# Patient Record
Sex: Female | Born: 1945
Health system: Southern US, Community
[De-identification: ages and names within clinical notes are randomized; demographics above are authoritative.]

## PROBLEM LIST (undated history)

## (undated) DIAGNOSIS — K219 Gastro-esophageal reflux disease without esophagitis: Secondary | ICD-10-CM

## (undated) DIAGNOSIS — Z950 Presence of cardiac pacemaker: Secondary | ICD-10-CM

## (undated) DIAGNOSIS — F419 Anxiety disorder, unspecified: Secondary | ICD-10-CM

## (undated) DIAGNOSIS — R9439 Abnormal result of other cardiovascular function study: Secondary | ICD-10-CM

## (undated) DIAGNOSIS — I10 Essential (primary) hypertension: Secondary | ICD-10-CM

## (undated) DIAGNOSIS — I499 Cardiac arrhythmia, unspecified: Secondary | ICD-10-CM

## (undated) DIAGNOSIS — Z8719 Personal history of other diseases of the digestive system: Secondary | ICD-10-CM

## (undated) DIAGNOSIS — L910 Hypertrophic scar: Secondary | ICD-10-CM

## (undated) DIAGNOSIS — E0500 Thyrotoxicosis with diffuse goiter without thyrotoxic crisis or storm: Secondary | ICD-10-CM

## (undated) DIAGNOSIS — M5430 Sciatica, unspecified side: Secondary | ICD-10-CM

## (undated) DIAGNOSIS — M1990 Unspecified osteoarthritis, unspecified site: Secondary | ICD-10-CM

## (undated) DIAGNOSIS — R55 Syncope and collapse: Secondary | ICD-10-CM

## (undated) DIAGNOSIS — F32A Depression, unspecified: Secondary | ICD-10-CM

## (undated) DIAGNOSIS — M5030 Other cervical disc degeneration, unspecified cervical region: Secondary | ICD-10-CM

## (undated) DIAGNOSIS — R7309 Other abnormal glucose: Secondary | ICD-10-CM

## (undated) DIAGNOSIS — E890 Postprocedural hypothyroidism: Secondary | ICD-10-CM

## (undated) DIAGNOSIS — R7303 Prediabetes: Secondary | ICD-10-CM

## (undated) DIAGNOSIS — D649 Anemia, unspecified: Secondary | ICD-10-CM

## (undated) DIAGNOSIS — E559 Vitamin D deficiency, unspecified: Secondary | ICD-10-CM

## (undated) HISTORY — DX: Hypertrophic scar: L91.0

## (undated) HISTORY — DX: Other abnormal glucose: R73.09

## (undated) HISTORY — PX: APPENDECTOMY: SHX54

## (undated) HISTORY — DX: Anemia, unspecified: D64.9

## (undated) HISTORY — DX: Sciatica, unspecified side: M54.30

## (undated) HISTORY — DX: Thyrotoxicosis with diffuse goiter without thyrotoxic crisis or storm: E05.00

## (undated) HISTORY — DX: Presence of cardiac pacemaker: Z95.0

## (undated) HISTORY — PX: BUNIONECTOMY: SHX129

## (undated) HISTORY — DX: Gastro-esophageal reflux disease without esophagitis: K21.9

## (undated) HISTORY — PX: FRACTURE SURGERY: SHX138

## (undated) HISTORY — DX: Essential (primary) hypertension: I10

## (undated) HISTORY — DX: Abnormal result of other cardiovascular function study: R94.39

## (undated) HISTORY — PX: PACEMAKER INSERTION: SHX728

## (undated) HISTORY — PX: OTHER SURGICAL HISTORY: SHX169

## (undated) HISTORY — DX: Postprocedural hypothyroidism: E89.0

## (undated) HISTORY — DX: Syncope and collapse: R55

## (undated) HISTORY — DX: Vitamin D deficiency, unspecified: E55.9

---

## 1999-04-08 HISTORY — PX: BREAST BIOPSY: SHX20

## 2005-11-08 ENCOUNTER — Inpatient Hospital Stay (HOSPITAL_COMMUNITY): Admission: RE | Admit: 2005-11-08 | Discharge: 2005-11-09 | Payer: Self-pay | Admitting: Internal Medicine

## 2005-11-08 ENCOUNTER — Encounter (INDEPENDENT_AMBULATORY_CARE_PROVIDER_SITE_OTHER): Payer: Self-pay | Admitting: Specialist

## 2006-03-12 ENCOUNTER — Encounter: Admission: RE | Admit: 2006-03-12 | Discharge: 2006-03-12 | Payer: Self-pay | Admitting: Endocrinology

## 2008-08-18 ENCOUNTER — Emergency Department (HOSPITAL_COMMUNITY): Admission: EM | Admit: 2008-08-18 | Discharge: 2008-08-18 | Payer: Self-pay | Admitting: Emergency Medicine

## 2008-08-25 ENCOUNTER — Encounter: Admission: RE | Admit: 2008-08-25 | Discharge: 2008-08-25 | Payer: Self-pay | Admitting: Gastroenterology

## 2009-11-14 LAB — HM COLONOSCOPY

## 2010-03-22 ENCOUNTER — Encounter: Payer: Self-pay | Admitting: Internal Medicine

## 2010-03-26 ENCOUNTER — Encounter: Payer: Self-pay | Admitting: Internal Medicine

## 2010-03-27 ENCOUNTER — Encounter: Payer: Self-pay | Admitting: Internal Medicine

## 2010-03-28 ENCOUNTER — Encounter: Payer: Self-pay | Admitting: Cardiology

## 2010-03-28 ENCOUNTER — Ambulatory Visit: Payer: Self-pay

## 2010-03-28 ENCOUNTER — Ambulatory Visit (HOSPITAL_COMMUNITY)
Admission: RE | Admit: 2010-03-28 | Discharge: 2010-03-28 | Payer: Self-pay | Source: Home / Self Care | Attending: Internal Medicine | Admitting: Internal Medicine

## 2010-04-10 ENCOUNTER — Ambulatory Visit: Admission: RE | Admit: 2010-04-10 | Discharge: 2010-04-10 | Payer: Self-pay | Source: Home / Self Care

## 2010-04-15 ENCOUNTER — Ambulatory Visit
Admission: RE | Admit: 2010-04-15 | Discharge: 2010-04-15 | Payer: Self-pay | Source: Home / Self Care | Attending: Internal Medicine | Admitting: Internal Medicine

## 2010-04-15 DIAGNOSIS — I4949 Other premature depolarization: Secondary | ICD-10-CM | POA: Insufficient documentation

## 2010-04-15 DIAGNOSIS — I10 Essential (primary) hypertension: Secondary | ICD-10-CM | POA: Insufficient documentation

## 2010-04-15 DIAGNOSIS — E890 Postprocedural hypothyroidism: Secondary | ICD-10-CM | POA: Insufficient documentation

## 2010-04-15 DIAGNOSIS — I493 Ventricular premature depolarization: Secondary | ICD-10-CM | POA: Insufficient documentation

## 2010-04-15 DIAGNOSIS — K2990 Gastroduodenitis, unspecified, without bleeding: Secondary | ICD-10-CM

## 2010-04-15 DIAGNOSIS — K2970 Gastritis, unspecified, without bleeding: Secondary | ICD-10-CM | POA: Insufficient documentation

## 2010-04-15 DIAGNOSIS — K219 Gastro-esophageal reflux disease without esophagitis: Secondary | ICD-10-CM | POA: Insufficient documentation

## 2010-04-15 DIAGNOSIS — R928 Other abnormal and inconclusive findings on diagnostic imaging of breast: Secondary | ICD-10-CM | POA: Insufficient documentation

## 2010-04-28 ENCOUNTER — Encounter: Payer: Self-pay | Admitting: Endocrinology

## 2010-05-09 NOTE — Assessment & Plan Note (Signed)
Summary: nep/syncope-mb   Visit Type:  np Referring Provider:  Marinda Elk Primary Provider:  Dr. Daisy Floro  CC:  palpitations, syncope, and dizziness.  History of Present Illness: Cindy [name] is seen at the request of doctors Foy Guadalajara and Christella Scheuermann, with both of whom she is personal friends.   She is aremote history of vasovagal syncope, including following pain, phlebotomy, and IUD insertion.  She is referred now because of 2 episodes of syncope, the first of which occurred in May 2 of which occurred in November. Both episodes occurred while eating soft foods. The first occurred in a car. The second occurred sitting on the sofa.  She also had episode of presyncope in December which was associated with palpitations which he had noted for the first time the month before. She feels these as in pulses in her neck and when she takes her pulse she notes pulses. A Holter monitor was obtained by her primary physician demonstrated frequent PVCs which appear morphologically to be consistent with origin in the outflow tract and comprised 13% of her total beats. Thankfully, her echo cardiogram is obtained and demonstrates normal left ventricular function.     Problems Prior to Update: 1)  Syncope-deglutition  (ICD-780.2) 2)  Premature Ventricular Contractions  (ICD-427.69) 3)  Gastritis  (ICD-535.50) 4)  Unspecified Abnormal Mammogram  (ICD-793.80) 5)  Graves' Disease  (ICD-242.00) 6)  Gerd  (ICD-530.81) 7)  Hypertension  (ICD-401.9)  Current Medications (verified): 1)  Naprosyn 250 Mg Tabs (Naproxen) .Marland Kitchen.. 1 Tablet Two Times A Day, As Needed 2)  Claritin 10 Mg Tabs (Loratadine) .Marland Kitchen.. 1 Tablet Once Daily, As Needed 3)  Aspirin 81 Mg Tbec (Aspirin) .... Take One Tablet By Mouth Daily 4)  Daily-Vitamin  Tabs (Multiple Vitamin) .Marland Kitchen.. 1 Tablet Once Daily 5)  Omega-3 Epa Fish Oil 1205 Mg Caps (Omega-3 Fatty Acids) .... 1000mg  Once Daily 6)  Vitamin D 1000 Unit Tabs (Cholecalciferol) .... Once  Daily 7)  Prevacid 30 Mg Cpdr (Lansoprazole) .Marland Kitchen.. 1 Tablet Once Daily 8)  Dyazide 37.5-25 Mg Caps (Triamterene-Hctz) .Marland Kitchen.. 1 Cap Once Daily 9)  Paxil 10 Mg Tabs (Paroxetine Hcl) .Marland Kitchen.. 1 Tablet Once Daily 10)  Lisinopril 10 Mg Tabs (Lisinopril) .... Take One Tablet By Mouth Daily  Allergies (verified): 1)  ! Codeine  Past History:  Family History: Last updated: 05-11-10 Father: died age 64 (MI) Mother: died from COPD 53  Social History: Last updated: May 11, 2010 Full Time Married  Tobacco Use - Former.  Alcohol Use - yes  Risk Factors: Smoking Status: quit (May 11, 2010)  Past Medical History: gastroesophageal reflux Hypertension  Review of Systems       full review of systems was negative apart from a history of present illness and past medical history.   Vital Signs:  Patient profile:   65 year old female Height:      62 inches Weight:      156.25 pounds BMI:     28.68 Pulse rate:   63 / minute Pulse (ortho):   61 / minute BP sitting:   145 / 63  (left arm) BP standing:   150 / 80  Vitals Entered By: Caralee Ates CMA May 11, 2010 11:13 AM)  Serial Vital Signs/Assessments:  Time      Position  BP       Pulse  Resp  Temp     By           Lying RA  168/76   76  Claris Gladden RN           Sitting   159/77   60                    Claris Gladden RN           Standing  150/80   61                    Claris Gladden RN 2 min     Standing  142/76   59                    Claris Gladden RN 5 min     Standing  160/73   64                    Claris Gladden RN   Physical Exam  General:  Well developed, well nourished,note the older age Caucasian female appearing her stated age in no acute distress. Head:  normal HEENT Neck:  supple without thyromegaly the flat neck veins with normal carotids were brisk and full Chest Wall:  without kyphosis or scoliosis Lungs:  clear to auscultation Heart:  regular rate and rhythm without murmurs or  gallops Abdomen:  soft with active bowel sounds without benign pulsation or hepatomegaly Msk:  without obvious musculoskeletal defects Pulses:  intact distal pulses Extremities:  without clubbing cyanosis or edema Neurologic:  alert and oriented and grossly normal motor and sensory function Skin:  warm and dry Cervical Nodes:  without adenopathy Psych:  engaging    Holter Monitor  Procedure date:  04/15/2010  Findings:      sinus rhythm with rates ranging from 48-113 Frequent PVCs of a dominant morphology comprising 13% of total beats most consistent with outflow tract origin Short runs of atrial tachycardia  Impression & Recommendations:  Problem # 1:  SYNCOPE-DEGLUTITION (ICD-780.2) Is the patient's impression that her syncope is associated with swallowing.  She describes a seizing" sensation prior to this. Swallowing syncope is a reflex syncope associated with an effector limb of bradycardia and hypotension, the trigger of which can be variable. It is unusual in my limited experience with [place] at soft mushy foods would serve as the trigger. She also has this episode of presyncope associated with palpitations. Her Holter monitor demonstrated frequent (greater than 13%) PVCs likely of an outflow tract origin which were for her largely asymptomatic. This suggests the possibility that 1) the swallowing syncopal episodes were in fact related to the PVCs in the swallowing is a "red herring" and 2) the palpitations that she felt with her December episode represented more than just PVCs, i.e. perhaps ventricular tachycardia.  Given the distinct possibility for the cause of syncope, implantable loop according may be our only way of identifying the mechanism, anticipating that deglutition would be associated with bradycardia, and outflow tract tachycardia would be clearly visible. It is possible that deglutition is associated only with hypotension so it is not necessarily a perfect test.  An  alternative approach would be to an undertaken empiric therapy using beta blockers and calcium blockers and she needs augmented blood pressure therapy anyway. This might be somewhat limited by her bradycardia which is identified on her Holter monitor. I have reviewed these things with her extensively and will talk with her primary physicians about this. Her updated medication list for this problem includes:    Aspirin 81 Mg Tbec (Aspirin) .Marland Kitchen... Take one tablet by mouth daily  Lisinopril 10 Mg Tabs (Lisinopril) .Marland Kitchen... Take one tablet by mouth daily  Problem # 2:  PREMATURE VENTRICULAR CONTRACTIONS (ICD-427.69) she has frequent PVCs; they're largely asymptomatic. Thankfully they are unassociated with any evidence of a secondary cardiomyopathy. Her updated medication list for this problem includes:    Aspirin 81 Mg Tbec (Aspirin) .Marland Kitchen... Take one tablet by mouth daily    Lisinopril 10 Mg Tabs (Lisinopril) .Marland Kitchen... Take one tablet by mouth daily  Problem # 3:  HYPERTENSION (ICD-401.9) her blood pressure remains poorly controlled and she sterilely had reactive hypertension. We will treat her with a low-dose beta blocker in the hopes of trying to mitigate adrenaline surges as a reactive event as well as to have an impact on decreasing her PVC burden. Her updated medication list for this problem includes:    Aspirin 81 Mg Tbec (Aspirin) .Marland Kitchen... Take one tablet by mouth daily    Dyazide 37.5-25 Mg Caps (Triamterene-hctz) .Marland Kitchen... 1 cap once daily    Lisinopril 10 Mg Tabs (Lisinopril) .Marland Kitchen... Take one tablet by mouth daily

## 2010-05-15 NOTE — Letter (Signed)
Summary: [facility] Progress Note   [facility] Progress Note   Imported By: Roderic Ovens 05/03/2010 14:08:43  _____________________________________________________________________  External Attachment:    Type:   Image     Comment:   External Document

## 2010-05-15 NOTE — Letter (Signed)
Summary: [facility] Note   [facility] Note   Imported By: Roderic Ovens 05/03/2010 14:13:21  _____________________________________________________________________  External Attachment:    Type:   Image     Comment:   External Document

## 2010-05-22 ENCOUNTER — Telehealth: Payer: Self-pay | Admitting: Internal Medicine

## 2010-05-31 ENCOUNTER — Encounter: Payer: Self-pay | Admitting: Internal Medicine

## 2010-05-31 ENCOUNTER — Telehealth: Payer: Self-pay | Admitting: Internal Medicine

## 2010-06-04 NOTE — Progress Notes (Signed)
Summary: Schedule Procedure  Phone Note Call from Patient Call back at Work Phone [phone]   Caller: Patient Reason for Call: Talk to Nurse Summary of Call: Pt wants to schedule an implant procedure. pt states she was told to call and talk to dr. Graciela Husbands nurse. Initial call taken by: Roe Coombs,  May 22, 2010 10:14 AM  Follow-up for Phone Call        I spoke with the pt and she has decided that she would like to have loop recorder placed.  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  Dr Graciela Husbands is scheduled in the hospital on 06/10/10 and 06/13/10 and the pt would like to have procedure one of these days.  Per pt we may leave message on home or work phone.  I will forward this message to Wynona Canes to schedule procedure and contact pt.  Julieta Gutting, RN, BSN  May 22, 2010 10:26 AM    [facility] Scherrie Bateman, LPN  May 23, 2010 4:45 PM  LMT[facility] Scherrie Bateman, LPN  May 24, 2010 9:52 AM

## 2010-06-04 NOTE — Progress Notes (Signed)
Summary: CALLING TO SCHEDULE PROCEDURE /LMTCB  Phone Note Call from Patient Call back at Home Phone [phone]   Caller: Patient Summary of Call: PT CALLING TO SCHEDULE A PROCEDURE( LOOP RECORDER) Initial call taken by: Judie Grieve,  May 31, 2010 9:59 AM  Follow-up for Phone Call        [facility] Scherrie Bateman, LPN  May 31, 2010 11:36 AM  PROCEDURE SCHEDULED FOR 06/10/10 AT 10:30 AM. LEFT WORD ON PT'S MACHINE WITH INSTRUCTIONS. Follow-up by: Scherrie Bateman, LPN,  May 31, 2010 11:56 AM

## 2010-06-04 NOTE — Letter (Addendum)
Summary: Implantable Device Instructions  Architectural technologist, Main Office  [address]   [place], [place] [address]   Phone: [phone]  Fax: [phone]      Implantable Device Instructions  You are scheduled for:  _____ Permanent Transvenous Pacemaker _____ Implantable Cardioverter Defibrillator ___X__ Implantable Loop Recorder _____ Generator Change  on _3/5/12____ with Dr. _KLEIN____.  1.  Please arrive at the Short Stay Center at [facility] at _8:30 AM____ on the day of your procedure.  2.  Do not eat or drink the night before your procedure.  3.  Complete lab work on _3/1/12____.  The lab at [facility] is open from 8:30 AM to 1:30 PM and from 2:30 PM to 5:00 PM.  The lab at [facility] is open from 7:30 AM to 5:30 PM.  You do not have to be fasting.  4.  Do NOT take DYAZIDE AM OF PROCEDURE.  5.  Plan for an overnight stay.  Bring your insurance cards and a list of your medications.  6.  Wash your chest and neck with antibacterial soap (any brand) the evening before and the morning of your procedure.  Rinse well.  7.  Education material received:      *If you have ANY questions after you get home, please call the office [phone].  *Every attempt is made to prevent procedures from being rescheduled.  Due to the nauture of Electrophysiology, rescheduling can happen.  The physician is always aware and directs the staff when this occurs.     DR Brett Canales KLEIN/ [facility] LPN

## 2010-06-06 ENCOUNTER — Other Ambulatory Visit (INDEPENDENT_AMBULATORY_CARE_PROVIDER_SITE_OTHER): Payer: BC Managed Care – PPO

## 2010-06-06 ENCOUNTER — Other Ambulatory Visit: Payer: Self-pay | Admitting: Internal Medicine

## 2010-06-06 ENCOUNTER — Encounter: Payer: Self-pay | Admitting: Internal Medicine

## 2010-06-06 DIAGNOSIS — R55 Syncope and collapse: Secondary | ICD-10-CM

## 2010-06-06 HISTORY — PX: OTHER SURGICAL HISTORY: SHX169

## 2010-06-06 LAB — APTT: aPTT: 28.7 s (ref 21.7–28.8)

## 2010-06-06 LAB — BASIC METABOLIC PANEL WITH GFR
BUN: 21 mg/dL (ref 6–23)
CO2: 29 meq/L (ref 19–32)
Calcium: 9.2 mg/dL (ref 8.4–10.5)
Chloride: 94 meq/L — ABNORMAL LOW (ref 96–112)
Creatinine, Ser: 0.8 mg/dL (ref 0.4–1.2)
GFR: 73.33 mL/min
Glucose, Bld: 92 mg/dL (ref 70–99)
Potassium: 3.8 meq/L (ref 3.5–5.1)
Sodium: 133 meq/L — ABNORMAL LOW (ref 135–145)

## 2010-06-06 LAB — CBC WITH DIFFERENTIAL/PLATELET
Basophils Absolute: 0 10*3/uL (ref 0.0–0.1)
Basophils Relative: 0.5 % (ref 0.0–3.0)
Eosinophils Absolute: 0.1 10*3/uL (ref 0.0–0.7)
Lymphocytes Relative: 37.2 % (ref 12.0–46.0)
MCHC: 35.1 g/dL (ref 30.0–36.0)
Neutrophils Relative %: 54.4 % (ref 43.0–77.0)
Platelets: 242 10*3/uL (ref 150.0–400.0)
RBC: 3.95 Mil/uL (ref 3.87–5.11)
RDW: 13.5 % (ref 11.5–14.6)

## 2010-06-06 LAB — PROTIME-INR
INR: 1 ratio (ref 0.8–1.0)
Prothrombin Time: 11.4 s (ref 10.2–12.4)

## 2010-06-10 ENCOUNTER — Ambulatory Visit (HOSPITAL_COMMUNITY): Payer: BC Managed Care – PPO

## 2010-06-10 ENCOUNTER — Ambulatory Visit (HOSPITAL_COMMUNITY)
Admission: RE | Admit: 2010-06-10 | Discharge: 2010-06-10 | Disposition: A | Discharge status: Home / Self Care | Payer: BC Managed Care – PPO | Source: Ambulatory Visit | Attending: Internal Medicine | Admitting: Internal Medicine

## 2010-06-10 DIAGNOSIS — I4949 Other premature depolarization: Secondary | ICD-10-CM | POA: Insufficient documentation

## 2010-06-10 DIAGNOSIS — K219 Gastro-esophageal reflux disease without esophagitis: Secondary | ICD-10-CM | POA: Insufficient documentation

## 2010-06-10 DIAGNOSIS — I498 Other specified cardiac arrhythmias: Secondary | ICD-10-CM | POA: Insufficient documentation

## 2010-06-10 DIAGNOSIS — E0500 Thyrotoxicosis with diffuse goiter without thyrotoxic crisis or storm: Secondary | ICD-10-CM | POA: Insufficient documentation

## 2010-06-10 DIAGNOSIS — Z01810 Encounter for preprocedural cardiovascular examination: Secondary | ICD-10-CM | POA: Insufficient documentation

## 2010-06-10 DIAGNOSIS — R55 Syncope and collapse: Secondary | ICD-10-CM

## 2010-06-10 DIAGNOSIS — K2990 Gastroduodenitis, unspecified, without bleeding: Secondary | ICD-10-CM | POA: Insufficient documentation

## 2010-06-10 DIAGNOSIS — I10 Essential (primary) hypertension: Secondary | ICD-10-CM | POA: Insufficient documentation

## 2010-06-10 DIAGNOSIS — K2970 Gastritis, unspecified, without bleeding: Secondary | ICD-10-CM | POA: Insufficient documentation

## 2010-06-10 DIAGNOSIS — Z7982 Long term (current) use of aspirin: Secondary | ICD-10-CM | POA: Insufficient documentation

## 2010-06-10 LAB — SURGICAL PCR SCREEN: Staphylococcus aureus: NEGATIVE

## 2010-06-11 ENCOUNTER — Encounter: Payer: Self-pay | Admitting: Internal Medicine

## 2010-06-18 ENCOUNTER — Telehealth: Payer: Self-pay | Admitting: Internal Medicine

## 2010-06-18 NOTE — Miscellaneous (Signed)
Summary: Device preload  Clinical Lists Changes  Observations: Added new observation of ILR SERIAL: [id number] (06/11/2010 7:29) Added new observation of ILR MODEL: 9529  (06/11/2010 7:29) Added new observation of ILR VENDOR: Medtronic  (06/11/2010 7:29) Added new observation of ILR DTOFINS: 06/10/2010  (06/11/2010 7:29) Added new observation of ILR MD: Sherryl Manges, MD  (06/11/2010 7:29)       ILR Following MD Sherryl Manges, MD DOI:  06/10/2010 Vendor:  Medtronic     Model Number:  9529     Serial Number [id number]

## 2010-06-20 ENCOUNTER — Encounter: Payer: Self-pay | Admitting: Internal Medicine

## 2010-06-20 ENCOUNTER — Encounter (INDEPENDENT_AMBULATORY_CARE_PROVIDER_SITE_OTHER): Payer: BC Managed Care – PPO

## 2010-06-20 DIAGNOSIS — R55 Syncope and collapse: Secondary | ICD-10-CM

## 2010-06-25 NOTE — Progress Notes (Signed)
Summary: question and pain re device  Phone Note Call from Patient Call back at Home Phone [phone]   Caller: Patient Reason for Call: Talk to Nurse Summary of Call: pt has question re device. pt is having pain around the site of the device that was inplanted. pt states please call her after 230 at # [phone]. Initial call taken by: Roe Coombs,  June 18, 2010 10:46 AM  Follow-up for Phone Call        pt scheduled for wound check on 06-20-10 @ 1400.  pt never received activator.  Barbara Cower was called to see about getting pt activator. Vella Kohler  June 19, 2010 9:04 AM

## 2010-06-25 NOTE — Procedures (Signed)
Summary: loop recorder wound check   Current Medications (verified): 1)  Naprosyn 250 Mg Tabs (Naproxen) .Marland Kitchen.. 1 Tablet Two Times A Day, As Needed 2)  Claritin 10 Mg Tabs (Loratadine) .Marland Kitchen.. 1 Tablet Once Daily, As Needed 3)  Aspirin 81 Mg Tbec (Aspirin) .... Take One Tablet By Mouth Daily 4)  Daily-Vitamin  Tabs (Multiple Vitamin) .Marland Kitchen.. 1 Tablet Once Daily 5)  Omega-3 Epa Fish Oil 1205 Mg Caps (Omega-3 Fatty Acids) .... 1000mg  Once Daily 6)  Vitamin D 1000 Unit Tabs (Cholecalciferol) .... Once Daily 7)  Prevacid 30 Mg Cpdr (Lansoprazole) .Marland Kitchen.. 1 Tablet Once Daily 8)  Dyazide 37.5-25 Mg Caps (Triamterene-Hctz) .Marland Kitchen.. 1 Cap Once Daily 9)  Paxil 20 Mg Tabs (Paroxetine Hcl) .Marland Kitchen.. 1 Tablet Once Daily 10)  Lisinopril 10 Mg Tabs (Lisinopril) .... Take One Tablet By Mouth in The Am and 1/2 in The Evening.  Allergies (verified): 1)  ! Codeine   ILR Following MD Sherryl Manges, MD DOI:  06/10/2010 Vendor:  Medtronic     Model Number:  1610     Serial Number [id number]        Tech Comments:  See PaceArt

## 2010-06-25 NOTE — Letter (Signed)
Summary: [facility] Orders  [facility] Orders   Imported By: Earl Many 06/13/2010 17:00:17  _____________________________________________________________________  External Attachment:    Type:   Image     Comment:   External Document

## 2010-06-27 ENCOUNTER — Inpatient Hospital Stay (HOSPITAL_COMMUNITY)
Admission: EM | Admit: 2010-06-27 | Discharge: 2010-06-30 | DRG: 218 | Disposition: A | Discharge status: Home Health | Payer: BC Managed Care – PPO | Attending: Internal Medicine | Admitting: Internal Medicine

## 2010-06-27 ENCOUNTER — Emergency Department (HOSPITAL_COMMUNITY): Payer: BC Managed Care – PPO

## 2010-06-27 DIAGNOSIS — F3289 Other specified depressive episodes: Secondary | ICD-10-CM | POA: Diagnosis present

## 2010-06-27 DIAGNOSIS — W010XXA Fall on same level from slipping, tripping and stumbling without subsequent striking against object, initial encounter: Secondary | ICD-10-CM | POA: Diagnosis present

## 2010-06-27 DIAGNOSIS — I10 Essential (primary) hypertension: Secondary | ICD-10-CM | POA: Diagnosis present

## 2010-06-27 DIAGNOSIS — S82853A Displaced trimalleolar fracture of unspecified lower leg, initial encounter for closed fracture: Principal | ICD-10-CM | POA: Diagnosis present

## 2010-06-27 DIAGNOSIS — D62 Acute posthemorrhagic anemia: Secondary | ICD-10-CM | POA: Diagnosis not present

## 2010-06-27 DIAGNOSIS — Y92009 Unspecified place in unspecified non-institutional (private) residence as the place of occurrence of the external cause: Secondary | ICD-10-CM

## 2010-06-27 DIAGNOSIS — F329 Major depressive disorder, single episode, unspecified: Secondary | ICD-10-CM | POA: Diagnosis present

## 2010-06-27 DIAGNOSIS — R55 Syncope and collapse: Secondary | ICD-10-CM | POA: Diagnosis present

## 2010-06-27 LAB — ABO/RH: ABO/RH(D): A POS

## 2010-06-27 LAB — DIFFERENTIAL
Basophils Relative: 0 % (ref 0–1)
Eosinophils Absolute: 0 10*3/uL (ref 0.0–0.7)
Eosinophils Relative: 0 % (ref 0–5)
Neutrophils Relative %: 78 % — ABNORMAL HIGH (ref 43–77)

## 2010-06-27 LAB — BASIC METABOLIC PANEL
CO2: 27 mEq/L (ref 19–32)
Calcium: 8.9 mg/dL (ref 8.4–10.5)
Creatinine, Ser: 0.78 mg/dL (ref 0.4–1.2)
GFR calc Af Amer: >60 mL/min (ref >60)
GFR calc non Af Amer: >60 mL/min (ref >60)
Sodium: 139 mEq/L (ref 135–145)

## 2010-06-27 LAB — CBC
HCT: 37.8 % (ref 36.0–46.0)
MCHC: 33.6 g/dL (ref 30.0–36.0)
Platelets: 294 10*3/uL (ref 150–400)
RDW: 13.1 % (ref 11.5–15.5)
WBC: 5.9 10*3/uL (ref 4.0–10.5)

## 2010-06-27 LAB — PROTIME-INR: INR: 0.96 (ref 0.00–1.49)

## 2010-06-27 LAB — APTT: aPTT: 33 seconds (ref 24–37)

## 2010-06-27 LAB — GLUCOSE, CAPILLARY: Glucose-Capillary: 79 mg/dL (ref 70–99)

## 2010-06-27 LAB — SURGICAL PCR SCREEN: Staphylococcus aureus: NEGATIVE

## 2010-06-27 NOTE — Op Note (Signed)
  NAMETARONDA, Cindy [name]            ACCOUNT NO.:  [id number]  MEDICAL RECORD NO.:  [id number]           PATIENT TYPE:  O  LOCATION:  [facility]                         FACILITY:  [facility]  PHYSICIAN:  Duke Salvia, MD, FACCDATE OF BIRTH:  1945-08-04  DATE OF PROCEDURE:  06/10/2010 DATE OF DISCHARGE:  06/10/2010                              OPERATIVE REPORT   PREOPERATIVE DIAGNOSIS:  Syncope.  POSTOPERATIVE DIAGNOSIS:  Syncope.  PROCEDURE:  Loop recorder implantation.  SURGEON:  Duke Salvia, MD, [facility]  Following obtaining informed consent, the patient was brought to the Electrophysiology Laboratory and placed on the table in supine position. After routine prep and drape, a 1.5-cm to 2-cm incision was made about 2 cm caudal of the clavicle, about 1 cm lateral to the sternum and carried down to the layer of the prepectoral fascia using electrocautery, a pocket was formed.  Two 2-0 silk sutures were placed at the cephalad aspect of the pocket and were then used to secure a Medtronic Reveal recorder, serial number [id number].  The pocket was copiously irrigated with antibiotic-containing saline solution.  The wound was closed in 3 layers in a normal fashion following hemostasis having been obtained.  A benzoin and Steri-Strip dressing was applied.  Needle counts, sponge counts, and instrument counts were correct at the end of the procedure according to the staff.  The patient tolerated the procedure without apparent complication.     Duke Salvia, MD, [facility]     SCK/MEDQ  D:  06/10/2010  T:  06/11/2010  Job:  [id number]  Electronically Signed by Sherryl Manges MD [facility] on 06/27/2010 08:35:03 AM

## 2010-06-28 ENCOUNTER — Inpatient Hospital Stay (HOSPITAL_COMMUNITY): Payer: BC Managed Care – PPO

## 2010-06-28 LAB — CBC
HCT: 33.2 % — ABNORMAL LOW (ref 36.0–46.0)
Hemoglobin: 10.6 g/dL — ABNORMAL LOW (ref 12.0–15.0)
MCV: 93.3 fL (ref 78.0–100.0)
WBC: 6.8 10*3/uL (ref 4.0–10.5)

## 2010-06-28 LAB — BASIC METABOLIC PANEL
CO2: 26 mEq/L (ref 19–32)
Glucose, Bld: 187 mg/dL — ABNORMAL HIGH (ref 70–99)
Potassium: 4.2 mEq/L (ref 3.5–5.1)
Sodium: 136 mEq/L (ref 135–145)

## 2010-06-29 LAB — URINALYSIS, ROUTINE W REFLEX MICROSCOPIC
Glucose, UA: NEGATIVE mg/dL
Protein, ur: NEGATIVE mg/dL
Specific Gravity, Urine: 1.019 (ref 1.005–1.030)
pH: 5.5 (ref 5.0–8.0)

## 2010-07-09 NOTE — Discharge Summary (Signed)
Cindy [name], Cindy [name]            ACCOUNT NO.:  [id number]  MEDICAL RECORD NO.:  [id number]           PATIENT TYPE:  I  LOCATION:  1426                         FACILITY:  [facility]  PHYSICIAN:  Kathlen Mody, MD       DATE OF BIRTH:  Nov 22, 1945  DATE OF ADMISSION:  06/27/2010 DATE OF DISCHARGE:  06/30/2010                              DISCHARGE SUMMARY   PRIMARY CARE PHYSICIAN:  Dr. Eliezer Lofts.  CARDIOLOGIST:  Duke Salvia, MD, [facility].  ORTHOPEDICS:  Erasmo Leventhal, M.D.  DISCHARGE DIAGNOSIS:  Mechanical fall, status post right ankle fracture.  OTHER DIAGNOSES:  Hypertension, history of Graves disease, depression, and 2 episodes of syncope while eating.  DISCHARGE MEDICATIONS: 1. Percocet 1 to 2 tablets q.4 h. p.r.n. 2. Tramadol 25 mg q.8 h. p.r.n. 3. Senna 2 tablets p.o. daily p.r.n. 4. Docusate 100 mg b.i.d. p.r.n. 5. Methocarbamol 500 mg p.o. q.8 h. 6. Paroxetine 20 mg 1 tablet daily. 7. Triamterene/hydrochlorothiazide 37.5/25 mg 1 tablet p.o. daily. 8. Vitamin D3 1 tablet daily. 9. Aspirin 81 mg 1 tablet daily. 10.Omeprazole 20 mg 1 tablet daily. 11.Multivitamin 1 tablet daily. 12.Omega-3-acid ethyl esters daily. 13.Lisinopril 5 mg 1 to 2 tablets p.o. twice a day.  CONSULTS CALLED:  Orthopedist consult from Dr. Thomasena Edis.  PROCEDURE:  ORIF of the right ankle, trimalleolar fracture.  PERTINENT LABORATORY DATA:  The patient had CBC done, which was within normal limits.  INR 0.9.  Basic metabolic panel pertinent for a glucose of 114, hemoglobin A1c of 6.  UA, negative for nitrites and leukocytes.  RADIOLOGY:  Right ankle x-ray shows fractures of distal tibia and fibula with displacement and tilting of talus and disruption of mortise, left foot fracture, and no acute abnormality.  Chest x-ray, no active lung disease.  Right ankle x-ray, reduction of tibiotalar subluxation with improved position alignment of the medial and lateral malleolar fracture right tibia  fibular post surgery metallic fixation, material distal right tibia and fibula and anatomic alignment.  No proximal fracture noted in the right tibia and fibula.  BRIEF HOSPITAL COURSE:  [name] with history of syncope and loop recorder implantation, came to ER status post mechanical fall with right ankle fracture.  Orthopedician consult was called, underwent ORIF of the right ankle trimalleolar fracture.  Loop recorder did not show any bradycardia and no syncopal attacks while she was in the hospital. PT, OT consult was called.  Recommended home physical therapy.  Hypertension.  Continue with lisinopril, held her hydrochlorothiazide for now while she was in the hospital.  The patient will be restarted on her home medications for blood pressure.  Hyperglycemia.  She had an hemoglobin A1c done, which was 6.  Borderline diabetes, was encouraged with diet and exercise.  PHYSICAL EXAMINATION:  VITAL SIGNS:  On the day of discharge, the patient's vitals temperature of 99.1, pulse of 73, blood pressure 121/75, respiratory rate 20, and saturating 96% on room air. GENERAL:  On exam, she is alert, afebrile, and oriented x3. CARDIOVASCULAR:  S1 and S2 heard.  No rubs, murmurs, or gallops. RESPIRATORY:  Good air entry bilaterally.  No wheezes or  rhonchi. ABDOMEN:  Soft, nontender, and nondistended.  Good bowel sounds. EXTREMITIES:  Right lower extremity in cast.  No edema on the left lower extremity.  The patient is hemodynamically stable for discharge.  She will continue with her pain medication and will be discharged home with home care. She is recommended to follow up with her orthopedist in about 2 weeks Dr. Thomasena Edis and with Dr. Graciela Husbands in about 1 week and with PCP in about 1 to 2 weeks.          ______________________________ Kathlen Mody, MD     VA/MEDQ  D:  06/30/2010  T:  07/01/2010  Job:  [id number]  Electronically Signed by Kathlen Mody MD on 07/09/2010 09:58:39 PM

## 2010-07-09 NOTE — H&P (Signed)
Cindy [name], Cindy [name]            ACCOUNT NO.:  [id number]  MEDICAL RECORD NO.:  [id number]           PATIENT TYPE:  E  LOCATION:  WLED                         FACILITY:  [facility]  PHYSICIAN:  Kathlen Mody, MD       DATE OF BIRTH:  08/02/1945  DATE OF ADMISSION:  06/27/2010 DATE OF DISCHARGE:                             HISTORY & PHYSICAL   PRIMARY CARE PHYSICIAN:  Dr. Eliezer Lofts of [facility] in [place].  CARDIOLOGIST AND ELECTROPHYSIOLOGIST:  Dr. Sherryl Manges of [facility].  CHIEF COMPLAINT:  Mechanical fall with right ankle fracture.  HISTORY OF PRESENT ILLNESS:  This is a 65 year old female with a recent history of syncope and loop recorder implantation who presents to the [facility] ED today after a fall resulting in a right ankle fracture. The patient reports that she slipped on a floor mat.  She denies any dizziness, loss of consciousness, headache, syncope, or changes to her vision.  She insists this is simply a mechanical fall.  Orthopedics has seen the patient and they are planning surgical repair of her distal right tibia and fibula fracture this evening at 5 p.m.  They asked [facility] to admit the patient, given her recent cardiac history.  I spoke on the phone with Dr. Sherryl Manges, electrophysiologist, about the patient and her implanted loop recorder.  Dr. Graciela Husbands commented that if this was truly a mechanical fall, nothing needs to be done with the loop recorder, no changes need to be made given her syncopal episodes. He felt that the device did not even need to be interrogated.  The patient's history of syncope started in childhood, but most recently when she eats soft foods she will occasionally develop chest pain on swallowing and pass out.  This has happened twice in the past year.  She also has a history of fainting during  IUD implementation.  PAST MEDICAL HISTORY:  Significant for: 1. Hypertension. 2. History of  Graves' disease that has resolved. 3. Depression. 4. Syncopal episodes while eating.  PAST SURGICAL HISTORY:  Surgeries have included: 1. Bunionectomy. 2. Breast biopsy. 3. Appendectomy. 4. [employer] loop recorder implementation on June 11, 2010.  MEDICATIONS:  Home medications are as follows: 1. Lisinopril 5 mg 1 to 2 tablets b.i.d. 2. Paroxetine 20 mg 1 tablet every morning. 3. Omeprazole 20 mg daily. 4. Omega-3 acid ethyl esters 1 g, 1 tab daily. 5. Multivitamin over-the-counter 1 tab daily. 6. 81-mg aspirin 1 tab daily. 7. Vitamin D3, 1000 units OTC 1 tab daily. 8. Triamterene/hydrochlorothiazide 37.5/25 one tab daily.  ALLERGIES:  The patient has an allergy to CODEINE.  REVIEW OF SYSTEMS:  The patient denies any insomnia, night sweats, or weight loss.  She denies any headache; changes to her vision; pain in her ears, eyes, nose, or throat.  CHEST:  She denies difficulty breathing, cough, or hemoptysis.  CARDIOVASCULAR:  She denies any palpitations, chest pain, orthopnea, or PND.  She does endorsebradycardia, which is chronic in nature.  GASTROINTESTINAL:  The patient denies any abdominal pain, vomiting, changes to her bowel habits. GENITOURINARY:  She denies any hematuria, dysuria, or frequency. EXTREMITIES:  Prior to this morning, she denies any decreased range of motion, acute pain or swelling in her extremities.  Now she has had multiple pain medications; is no longer having pain in her lower extremities, but does have swelling and bruising in both of them.  FAMILY HISTORY:  Significant for her mother who died in her early 64s with a CVA, her father who died in his 64s with emphysema.  SOCIAL HISTORY:  She smoked tobacco when she was in college and quit upon graduation, years ago.  She occasionally has a glass of wine on the weekend.  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  She is a full code.  She lives at home with her husband.  PHYSICAL  EXAM:  GENERAL:  This is a well-developed, well-nourished, very pleasant Caucasian female lying in no apparent distress in the [facility]. VITAL SIGNS:  Temperature 98.4, pulse 61, respirations 20, blood pressure 134/64. HEENT:  Head is atraumatic, normocephalic.  Eyes are anicteric with pupils that are equal and round.  Nose shows no nasal discharge or exterior lesions.  Mouth has moist mucous membranes with good dentition. NECK:  Supple, with midline trachea.  No JVD.  No lymphadenopathy. CHEST:  Demonstrates no accessory muscle use. LUNGS:  Clear to auscultation bilaterally. HEART:  Slightly bradycardic with a pulse in the 50s.  No murmurs, rubs, or gallops. ABDOMEN: Soft, nontender, nondistended, with bowel sounds. EXTREMITIES:  Upper extremities show no clubbing, cyanosis, or edema. Peripheral pulses are 2+.  Lower Extremities:  Her left lower extremity has a hematoma on the lateral dorsum of her foot.  Otherwise, she demonstrates no clubbing, cyanosis, or edema.  Her peripheral pulses are intact.  She is able to move the foot without difficulty.  Her right lower extremity is wrapped and bandaged and being iced.  I am unable to examine it.  LABS:  Pertinent for a WBC of 5.9, hemoglobin 12.7, hematocrit 37.8, platelets 294.  Sodium 139, potassium 3.8, chloride 104, bicarb 27, glucose 114, BUN 19, creatinine 0.78, calcium 8.9.  PTT 33, PT 13, INR 0.96.  UA is pending.  Chest x-ray:  Heart is within upper limits of normal.  Probable loop recorder overlies left chest.  No acute abnormality is seen.  No active lung disease.  Lungs are clear.  X-ray of her left foot:  No acute abnormality.  X-ray of her right ankle:  Fractures of the distal tibia and fibula with displacement and tilting of the talus and disruption of the mortise.  There is also a plantar calcaneal spur.  ASSESSMENT:  Dr. Kathlen Mody has seen and examined the patient, collected a history, reviewed her chart, and  spoken at length with the patient and the PA about the case.  Her impression is that this is a 65- year-old female with: 1. Mechanical fall this morning. Patient endured a right ankle fracture. 2. She had a recent episode of syncope; is currently slightly     bradycardic and wearing a loop recorder. 3. She has a history of hypertension; this is controlled on medications.  PLAN: 1. We will admit her, Orthopedic surgery will care for her fracture, she will be     seen by PT/OT.  Pain medications have already been written for by     Surgery.  For her bradycardia, syncope and her loop recorder - no     special arrangements need to be made for this per discussions with     Dr.  Graciela Husbands. 2. For her hypertension, will continue her lisinopril and hold her     HCTZ for today.  We may restart that tomorrow. 3. This patient is a full code. 4. If there are no medical complications following surgery this     evening, perhaps Orthopedics could assume primary attending roll on     June 29, 2010.     Stephani Police, PA   ______________________________ Kathlen Mody, MD    MLY/MEDQ  D:  06/27/2010  T:  06/27/2010  Job:  [id number]  cc:   Daisy Floro, MD [facility], [place], [place]: [phone]  Duke Salvia, MD, [facility] [address]  [address] [place] [address]  Electronically Signed by Algis Downs PA on 06/27/2010 02:39:10 PM Electronically Signed by Kathlen Mody MD on 07/09/2010 09:58:27 PM

## 2010-07-16 LAB — CBC
MCV: 91.1 fL (ref 78.0–100.0)
RBC: 4.19 MIL/uL (ref 3.87–5.11)
WBC: 3.4 10*3/uL — ABNORMAL LOW (ref 4.0–10.5)

## 2010-07-16 LAB — BASIC METABOLIC PANEL
Chloride: 103 mEq/L (ref 96–112)
Creatinine, Ser: 0.71 mg/dL (ref 0.4–1.2)
GFR calc Af Amer: >60 mL/min (ref >60)
Potassium: 3.9 mEq/L (ref 3.5–5.1)
Sodium: 140 mEq/L (ref 135–145)

## 2010-07-16 LAB — DIFFERENTIAL
Eosinophils Absolute: 0 10*3/uL (ref 0.0–0.7)
Lymphs Abs: 1.2 10*3/uL (ref 0.7–4.0)
Monocytes Relative: 5 % (ref 3–12)
Neutrophils Relative %: 57 % (ref 43–77)

## 2010-08-01 NOTE — Op Note (Signed)
NAMECRYSTALLYNN, Cindy [name]            ACCOUNT NO.:  [id number]  MEDICAL RECORD NO.:  [id number]           PATIENT TYPE:  I  LOCATION:  1426                         FACILITY:  [facility]  PHYSICIAN:  Erasmo Leventhal, M.D.DATE OF BIRTH:  1946-01-04  DATE OF PROCEDURE:  06/27/2010 DATE OF DISCHARGE:                              OPERATIVE REPORT   PREOPERATIVE DIAGNOSIS:  Right ankle trimalleolar fracture dislocation.  POSTOPERATIVE DIAGNOSIS:  Right ankle trimalleolar fracture dislocation.  PROCEDURE: 1. Open reduction and internal fixation of right ankle trimalleolar     fracture. 2. C-arm radiography.  SURGEON:  Erasmo Leventhal, MD  ASSISTANT:  Jamelle Rushing, P.A.  ANESTHESIA:  Regional with general.  ESTIMATED BLOOD LOSS:  Less than 20 cc.  DRAINS:  None.  COMPLICATIONS:  None.  TOURNIQUET TIME:  86 minutes at 250 mmHg.  DISPOSITION:  PACU, stable.  PROCEDURE IN DETAIL:  The patient counseled in the holding area and correct side was identified.  Regional anesthetic was administered by Dr. Council Mechanic and taken to the operating room, placed in supine position. The patient placed under general anesthesia.  IV antibiotics were given. Splint was removed.  The __________ was elevated and prepped with DuraPrep and draped in sterile fashion.  Esmarch tourniquet was inflated to 250 mmHg.  Lateral incision was made in the skin and subcutaneous tissue.  Hematoma was countered.  Blunt dissection was taken down at the lateral aspect of fibula.  It was open, there was marked comminution and fair osteopenia was noted.  There was also area of the fracture which was markedly comminuted.  Fracture was opened, irrigated thoroughly into the joint.  It was reduced as anatomic as possible made.  Length was reestablished.  At this point in time, we chose a DePuy composite plate, applied to the posterior aspect, locked into position, held proximally with a proximal appropriate screw and  distally with locking screws. Then the distal and proximal locking screws and standard screws in standard fashion.  X-rays revealed excellent establishment of length and rotation.  Attention directed medially.  There was an abrasion immediately on opening the skin, And blunt dissection carried was down to the medial tibia.  The fracture was opened, joint was irrigated, it was reduced anatomically, held with pins, and then we chose a DePuy titanium.  Once the tibial plate had been appropriate as the buttress plate, applied proximal and distal screws in appropriate fashion and made appropriate changes __________ C-arm radiography.  At this point in time, we checked the ankle.  We had excellent alignment, rotation, reduction of the fracture and also placed the implants.  On lateral side the, comminuted area was then utilized as local bone graft.  The medial side __________ periosteum closed with Vicryl, subcu Vicryl, skin with nylons.  Lateral side __________ periosteum closed with Vicryl, subcu Vicryl, skin with nylons.  Sterile dressing was applied, plaster splint.  Tourniquet deflated.  Another gram of Ancef intravenously.  She tolerated the procedure well.  There were no complications or problems.  She was awakened and taken to the recovery room in stable condition.  C-arm radiograph was utilized to confirm reduction  and placement of implants.  To help surgical technique and decision making, Mr. Oneida Alar, PA's assistance was needed.          ______________________________ Erasmo Leventhal, M.D.     RAC/MEDQ  D:  06/27/2010  T:  06/28/2010  Job:  [id number]  Electronically Signed by Eugenia Mcalpine M.D. on 08/01/2010 09:26:02 AM

## 2010-08-01 NOTE — Consult Note (Signed)
  Cindy [name], Cindy [name]            ACCOUNT NO.:  [id number]  MEDICAL RECORD NO.:  [id number]           PATIENT TYPE:  I  LOCATION:  1426                         [facility]ACILITY:  [facility]  PHYSICIAN:  Cindy [name], M.D.DATE OF BIRTH:  1946/02/08  DATE OF CONSULTATION:  06/27/2010 DATE OF DISCHARGE:                                CONSULTATION   REASON FOR CONSULTATION:  Consult is for Cindy [name] due to a dislocated right ankle trimalleolar fracture.  HISTORY OF PRESENT ILLNESS:  The patient is a 65 year old female who was at home earlier this evening who reports she was walking in her house tripped slid over the edge of a step and a kitty litter box twisting and injuring her right ankle with an obvious deformity and pain and pain in her left ankle.  The patient was brought to the emergency room for evaluation.  She denied any shortness of breath, chest pains, or loss of cautiousness creating the fall.  ER evaluation with x-rays showed she had no obvious fractures of her left foot, but she did have a large ecchymotic area over the base of the third, fourth, and fifth metatarsal shaft stone.  X-rays of her right ankle showed that she had a displaced dislocated right ankle trimalleolar fracture.  On examination of her foot, she had the obvious deformity.  She had good pulses in her foot, good sensation, good motor of her toes.  She had an abrasion directly over the medial malleolar fracture site which was slightly tented. After discussions and getting consent with the patient, Dr. Thomasena [name] under the assistance of the emergency room doctor for conscious sedation did a closed reduction.  Close examination of the abrasion over the medial malleolar region found that it was closed.  She was then placed in a plaster splint.  IV antibiotics were initiated as a precaution and she is sent for a post reduction x-rays.  Postreduction, she had a very strong dorsalis pedis pulse.  Good  sensation in all her toes.  Brisk capillary refill with good motor and decreased pain.  PLAN:  At this time, due to her medical history of hypertension, depression, and syncopal episodes with a recent implanted loop recorder by Dr. Graciela [name], the patient will be admitted on the Medical Services for evaluation and clearance and treatment and Orthopedic Services with Dr. Thomasena [name] will take her to the OR this evening for an emergent ORIF of her right ankle due to it being a very unstable fracture.  The patient will receive IV antibiotics for 72 hours.  She will be non-weightbearing post surgical procedure.  At this time, she is n.p.o. and has agreed to proceed with this surgical plan.  The patient was seen and evaluated and reduced by Dr. Thomasena [name].     Jamelle Rushing, P.A.   ______________________________ Cindy [name], M.D.    RWK/MEDQ  D:  06/27/2010  T:  06/28/2010  Job:  [id number]  Electronically Signed by Arlyn Leak P.A. on 07/10/2010 07:44:52 AM Electronically Signed by Eugenia Mcalpine M.D. on 08/01/2010 09:26:04 AM

## 2010-08-19 ENCOUNTER — Ambulatory Visit (INDEPENDENT_AMBULATORY_CARE_PROVIDER_SITE_OTHER): Payer: BC Managed Care – PPO | Admitting: *Deleted

## 2010-08-19 ENCOUNTER — Telehealth: Payer: Self-pay | Admitting: Internal Medicine

## 2010-08-19 DIAGNOSIS — R55 Syncope and collapse: Secondary | ICD-10-CM

## 2010-08-19 NOTE — Telephone Encounter (Signed)
Pls call her at [phone] because she had to leave home

## 2010-08-19 NOTE — Telephone Encounter (Signed)
I called and spoke with the pt. She states she was having a dinner party Saturday night and started to feel faint while she was sitting. She did proceed to pass out. She states her guest told her this lasted about 2 minutes. The pt says this has been the longest episode. Her husband did try to record this event. I have discussed this with our device nurse, Gunnar Fusi. The pt will come in this afternoon to have her device interrogated. She is agreeable with this.

## 2010-08-19 NOTE — Telephone Encounter (Signed)
Per pt calling, pt had fainting spell over the weekend. Pt asking should she come in early to have her monitor read.

## 2010-08-20 ENCOUNTER — Encounter: Payer: Self-pay | Admitting: Internal Medicine

## 2010-08-20 ENCOUNTER — Ambulatory Visit (INDEPENDENT_AMBULATORY_CARE_PROVIDER_SITE_OTHER): Payer: BC Managed Care – PPO | Admitting: Internal Medicine

## 2010-08-20 ENCOUNTER — Encounter: Payer: Self-pay | Admitting: *Deleted

## 2010-08-20 DIAGNOSIS — I442 Atrioventricular block, complete: Secondary | ICD-10-CM

## 2010-08-20 DIAGNOSIS — R55 Syncope and collapse: Secondary | ICD-10-CM

## 2010-08-20 DIAGNOSIS — Z950 Presence of cardiac pacemaker: Secondary | ICD-10-CM

## 2010-08-20 DIAGNOSIS — Z959 Presence of cardiac and vascular implant and graft, unspecified: Secondary | ICD-10-CM

## 2010-08-20 HISTORY — DX: Syncope and collapse: R55

## 2010-08-20 HISTORY — DX: Presence of cardiac pacemaker: Z95.0

## 2010-08-20 NOTE — Assessment & Plan Note (Signed)
The patient has had syncope while he HEENT. This is associated with normal rhythm. This suggests a vasomotor episode and a possible GI trigger needs to be sought. She will follow up with her gastroenterologist.  She then had an asymptomatic pause with complete heart block. Recent work reported in [place] [facility] suggested that possibly regardless of its cause i.e. Rhythmic or autonomic is appropriately paced. We have discussed this.  The benefits and risks were reviewed including but not limited to death,  perforation, infection, lead dislodgement and device malfunction.  The patient understands agrees and is willing to proceed.

## 2010-08-20 NOTE — Progress Notes (Signed)
  HPI  Cindy [name] is a 65 y.o. female Seen in followup for syncope. She has a remote history of vasovagal syncope, including following pain, phlebotomy, and IUD insertion.  Because of further episodes that occurred while eating and sitting she underwent loop recorder implantatione.  She had another episode of syncope while at a dinner party. Interrogation of that event demonstrated sinus rhythm.; she then had an asymptomatic pause recorded by the device demonstrated complete heart block associated with PT prolongation.  Past medical history is notable for hypertension and GE reflux disease  Current Outpatient Prescriptions  Medication Sig Dispense Refill  . aspirin 325 MG tablet Take 325 mg by mouth daily.        . cholecalciferol (VITAMIN D) 1000 UNITS tablet Take 1,000 Units by mouth daily.        Marland Kitchen lisinopril (PRINIVIL,ZESTRIL) 20 MG tablet Take 20 mg by mouth daily.        . Multiple Vitamin (MULTIVITAMIN) tablet Take 1 tablet by mouth daily.        Marland Kitchen omeprazole (PRILOSEC) 20 MG capsule Take 20 mg by mouth daily.        Marland Kitchen PARoxetine (PAXIL) 20 MG tablet Take 20 mg by mouth every morning.        . triamterene-hydrochlorothiazide (DYAZIDE) 37.5-25 MG per capsule Take 1 capsule by mouth every morning.        . vitamin C (ASCORBIC ACID) 500 MG tablet Take 500 mg by mouth daily.          Allergies  Allergen Reactions  . Codeine     Review of Systems negative except from HPI and PMH  Physical Exam Well developed and well nourished in no acute distress HENT normal E scleral and icterus clear Neck Supple JVP flat; carotids brisk and full Device pocket well-healed Clear to ausculation Regular rate and rhythm, no murmurs gallops or rub Soft with active bowel sounds No clubbing cyanosis and edema Alert and oriented, grossly normal motor and sensory function Skin Warm and Dry     Assessment and  Plan

## 2010-08-20 NOTE — Patient Instructions (Signed)

## 2010-08-20 NOTE — Assessment & Plan Note (Signed)
As above.

## 2010-08-23 NOTE — H&P (Signed)
Cindy [name], GEIGER            ACCOUNT NO.:  [id number]   MEDICAL RECORD NO.:  [id number]          PATIENT TYPE:  INP   LOCATION:  0098                         [facility]ACILITY:  [facility]   PHYSICIAN:  Ollen Gross. Vernell Morgans, M.D. DATE OF BIRTH:  08-Feb-1946   DATE OF ADMISSION:  11/08/2005  DATE OF DISCHARGE:                                HISTORY & PHYSICAL   HISTORY OF PRESENT ILLNESS:  Mrs. Gudiel is a 65 year old white female who  presents to the emergency department today with 1 day history of right lower  quadrant pain. The pain has been severe in nature. It does not really  radiate anywhere. She has had fevers. She denies any nausea and vomiting. No  chest pain, shortness of breath, diarrhea or dysuria. She has never had a  pain like this before.   REVIEW OF SYSTEMS:  The rest of review of systems are unremarkable.   PAST MEDICAL HISTORY:  Significant for depression, hypertension, Graves  disease.   PAST SURGICAL HISTORY:  Significant for a bunionectomy and a breast biopsy.   MEDICATIONS:  None.   ALLERGIES:  CODEINE.   SOCIAL HISTORY:  She denies the use of tobacco products.   FAMILY HISTORY:  Noncontributory.   PHYSICAL EXAMINATION:  VITAL SIGNS:  Temperature is 99.5, blood pressure  141/71, pulse 94.  GENERAL:  A well developed, well nourished, white female in no acute  distress.  SKIN:  Warm and dry. No jaundice.  HEENT:  Extraocular muscles intact. Pupils are equal, round, and reactive to  light. Sclerae nonicteric.  LUNGS:  Clear bilaterally with no use of accessory respiratory muscles.  HEART:  Regular rate and rhythm with an impulse in the left chest.  ABDOMEN:  Soft but significant tenderness in the right lower quadrant. No  peritonitis. No palpable mass or hepatosplenomegaly.  EXTREMITIES:  No clubbing, cyanosis, or edema. Good strength in her arms and  legs.  PSYCHIATRIC:  Alert and oriented times three with no evidence today of  anxiety or depression.   LABORATORY DATA:  White count normal at 6,500.   Her CT scan was reviewed with the radiologist and did show an enlarged  inflamed appendix on the right as well as a cyst on the left ovary.   ASSESSMENT/PLAN:  This is a 65 year old white female with what appears to be  acute appendicitis. Because of the risk of rupture and sepsis, I think she  needs to have an appendectomy today. I have discussed with her in detail  the risks and benefits of the operation to remove the appendix, as well as  some of the technical aspects. She understands and wishes to proceed. Will  obtain some routine preoperative lab work in preparation for doing this for  this afternoon.      Ollen Gross. Vernell Morgans, M.D.  Electronically Signed     PST/MEDQ  D:  11/08/2005  T:  11/08/2005  Job:  [id number]

## 2010-08-23 NOTE — Op Note (Signed)
Cindy [name], Cindy [name]            ACCOUNT NO.:  [id number]   MEDICAL RECORD NO.:  [id number]          PATIENT TYPE:  INP   LOCATION:  1612                         FACILITY:  [facility]   PHYSICIAN:  Ollen Gross. Vernell Morgans, M.D. DATE OF BIRTH:  1945-07-19   DATE OF PROCEDURE:  11/08/2005  DATE OF DISCHARGE:  11/09/2005                                 OPERATIVE REPORT   PREOPERATIVE DIAGNOSIS:  Appendicitis.   POSTOPERATIVE DIAGNOSIS:  Appendicitis.   PROCEDURE:  Laparoscopic appendectomy.   SURGEON:  Ollen Gross. Carolynne Edouard, M.D.   ANESTHESIA:  General endotracheal.   PROCEDURE:  After informed consent was obtained, the patient was brought to  the operating room and placed in the supine position on the operating table.  After induction of general anesthesia, the patient's abdomen was prepped  with Betadine and draped in the usual sterile manner.  The area below the  umbilicus was infiltrated with 0.25%  Marcaine.  A small incision was made  with a 15-blade knife.  This incision was carried down through the  subcutaneous tissue bluntly with the hemostat and [employer] retractors until  the linea alba was identified.  The linea alba was incised with a 15-blade  knife and each side was grasped with Kocher clamps and elevated anteriorly.  The preperitoneal space was then probed bluntly with the hemostat until the  peritoneum was opened and access was gained to the abdominal cavity.  A zero  Vicryl pursestring stitch was placed in the fascia surrounding the opening.  A Hasson cannula was placed through the opening and anchored in place with  the previously placed Vicryl pursestring stitch.  The abdomen was then  insufflated with carbon dioxide without difficulty.  The laparoscope was  inserted through the Hasson cannula and the right lower quadrant was  inspected.  The appendix was readily identified and appeared to be enlarged  and inflamed.  The patient was placed in Trendelenburg position and rotated  slightly with the right side up.  The suprapubic region was then infiltrated  with 0.25%  Marcaine.  A small incision was made with a 15-blade knife and a  12 mm port was placed bluntly through this incision into the abdominal  cavity under direct vision.  A site between the two ports was then chosen  for a 5 mm port.  This area was infiltrated with 0.25% Marcaine.  A small  stab incision was made with a 15-blade knife and a 5 mm port was placed  bluntly through this incision into the abdominal cavity under direct vision.  The laparoscope was then inserted through the suprapubic port.  A Glassman  grasper and harmonic scalpel were then used through the other two ports to  dissect in the right lower quadrant.  The appendix was readily identified.  The appendix was grasped with the [facility] and elevated.  The mesoappendix  was taken down sharply with the harmonic scalpel until the base of the  appendix at its junction with the cecum was identified and cleared of any  fatty tissue.  Next, the laparoscopic 60-B stapler was placed through the  Hasson cannula  across the base of the appendix at its junction with the  cecum.  The stapler was then clamped and fired, thereby dividing the base of  the appendix between staple lines.  Next, a laparoscopic bag was inserted  through the Hasson cannula.  The appendix was placed within the bag and the  bag was sealed.  The abdomen was then irrigated with copious amounts of  saline.  The appendiceal stump was examined again and found to be hemostatic  and healthy and intact.  There were no other abnormalities on cursory  examination of the rest of the abdomen.  At this point, the appendix was  removed with the Hasson cannula through the infraumbilical port without  difficulty.  The fascial defect was closed with the previously placed Vicryl  pursestring stitch, as well with another interrupted figure-of-eight zero  Vicryl stitch.  Each of the port sites was  irrigated with copious amounts of  saline.  The ports were all removed and the gas was allowed to escape.  The  skin incisions were closed with interrupted 4-0  Monocryl subcuticular stitches.  Benzoin, Steri-Strips and sterile dressings  were applied.  The patient tolerated the procedure well.  At the end of the  case, all needle, sponge and instrument counts were correct.  The patient  was then awakened and taken to the recovery room in stable condition.      Ollen Gross. Vernell Morgans, M.D.  Electronically Signed     PST/MEDQ  D:  11/13/2005  T:  11/13/2005  Job:  [id number]

## 2010-09-06 ENCOUNTER — Other Ambulatory Visit (INDEPENDENT_AMBULATORY_CARE_PROVIDER_SITE_OTHER): Payer: BC Managed Care – PPO | Admitting: *Deleted

## 2010-09-06 DIAGNOSIS — I442 Atrioventricular block, complete: Secondary | ICD-10-CM

## 2010-09-06 DIAGNOSIS — R55 Syncope and collapse: Secondary | ICD-10-CM

## 2010-09-06 DIAGNOSIS — Z959 Presence of cardiac and vascular implant and graft, unspecified: Secondary | ICD-10-CM

## 2010-09-06 LAB — BASIC METABOLIC PANEL
BUN: 23 mg/dL (ref 6–23)
Chloride: 104 mEq/L (ref 96–112)
GFR: 68.49 mL/min (ref >60.00)
Glucose, Bld: 103 mg/dL — ABNORMAL HIGH (ref 70–99)
Potassium: 3.9 mEq/L (ref 3.5–5.1)

## 2010-09-06 LAB — CBC WITH DIFFERENTIAL/PLATELET
Basophils Absolute: 0 10*3/uL (ref 0.0–0.1)
Eosinophils Absolute: 0.1 10*3/uL (ref 0.0–0.7)
HCT: 36.9 % (ref 36.0–46.0)
Lymphs Abs: 1.7 10*3/uL (ref 0.7–4.0)
MCHC: 34.1 g/dL (ref 30.0–36.0)
MCV: 92.5 fl (ref 78.0–100.0)
Monocytes Absolute: 0.2 10*3/uL (ref 0.1–1.0)
Platelets: 295 10*3/uL (ref 150.0–400.0)
RDW: 13.6 % (ref 11.5–14.6)

## 2010-09-06 LAB — PROTIME-INR: Prothrombin Time: 11.4 s (ref 10.2–12.4)

## 2010-09-10 ENCOUNTER — Encounter: Payer: BC Managed Care – PPO | Admitting: Internal Medicine

## 2010-09-11 ENCOUNTER — Ambulatory Visit (HOSPITAL_COMMUNITY)
Admission: RE | Admit: 2010-09-11 | Discharge: 2010-09-12 | Disposition: A | Discharge status: Home / Self Care | Payer: BC Managed Care – PPO | Source: Ambulatory Visit | Attending: Internal Medicine | Admitting: Internal Medicine

## 2010-09-11 DIAGNOSIS — Z7982 Long term (current) use of aspirin: Secondary | ICD-10-CM | POA: Insufficient documentation

## 2010-09-11 DIAGNOSIS — I442 Atrioventricular block, complete: Secondary | ICD-10-CM

## 2010-09-11 DIAGNOSIS — R55 Syncope and collapse: Secondary | ICD-10-CM | POA: Insufficient documentation

## 2010-09-11 DIAGNOSIS — Z79899 Other long term (current) drug therapy: Secondary | ICD-10-CM | POA: Insufficient documentation

## 2010-09-11 LAB — SURGICAL PCR SCREEN
MRSA, PCR: NEGATIVE
Staphylococcus aureus: NEGATIVE

## 2010-09-12 ENCOUNTER — Ambulatory Visit (HOSPITAL_COMMUNITY): Payer: BC Managed Care – PPO

## 2010-09-23 ENCOUNTER — Ambulatory Visit (INDEPENDENT_AMBULATORY_CARE_PROVIDER_SITE_OTHER): Payer: BC Managed Care – PPO | Admitting: *Deleted

## 2010-09-23 DIAGNOSIS — Z950 Presence of cardiac pacemaker: Secondary | ICD-10-CM

## 2010-09-23 DIAGNOSIS — Z959 Presence of cardiac and vascular implant and graft, unspecified: Secondary | ICD-10-CM

## 2010-09-23 DIAGNOSIS — I495 Sick sinus syndrome: Secondary | ICD-10-CM

## 2010-10-02 ENCOUNTER — Telehealth: Payer: Self-pay | Admitting: *Deleted

## 2010-10-02 NOTE — Telephone Encounter (Signed)
He is calling today to inquire about exercise for the patient. She is having PT on her ankle. She is not having to use her upper extremities for any strenuous activity. He states he will have her on the bike for some strengthening of her ankle and wanted to make sure it was ok if her HR got up some. I explained she should be fine for those things. Her PPM was placed 09/11/10.

## 2010-10-08 NOTE — Op Note (Signed)
Cindy [name], Cindy [name] NO.:  [id number]  MEDICAL RECORD NO.:  [id number]  LOCATION:  2001                         FACILITY:  [facility]  PHYSICIAN:  Duke Salvia, MD, FACCDATE OF BIRTH:  10-10-1945  DATE OF PROCEDURE:  09/11/2010 DATE OF DISCHARGE:                              OPERATIVE REPORT   PREOPERATIVE DIAGNOSIS:  Syncope with previously implanted loop recorder and documented complete heart block.  POSTOPERATIVE DIAGNOSIS:  Syncope with previously implanted loop recorder and documented complete heart block.  PROCEDURE:  Explantation of a previously implanted loop recorder and implantation of dual-chamber pacemaker.  Following obtaining informed consent, the patient was brought to the electrophysiology laboratory and placed on the fluoroscopic table in supine position.  After routine prep and drape of the left upper chest, lidocaine was infiltrated in the prepectoral subclavicular region. Incision was made and carried down to layer of the prepectoral fascia using electrocautery and sharp dissection.  A pocket was formed similarly.  Hemostasis was obtained.  Thereafter, attention was turned to gain access to extrathoracic left subclavian vein which was accomplished with modest difficulty but without aspiration of air or puncture of the artery.  Two separate venipunctures were accomplished.  Guidewires were placed and retained and sequentially 7-French sheath were placed which were passed a Medtronic 5076, 52-cm active fixation ventricular lead, serial #[id number] and a 5076, 46-cm active fixation atrial lead, serial #[id number].  I should note that prior to insertion of the atrial lead, the ventricular lead was marked with a tie.  The leads were manipulated to the right ventricular apex and right atrial appendage respectively where bipolar R-wave was 8 initially and at 40 mV, pacing impedance was 1037 ohms, threshold 1.4 volts at 0.5 milliseconds.   Immediately after screw deployment, current of injury was brisk and there was no diaphragmatic pacing at 10 volts.  The bipolar P-wave was 3.0 mV with a pacing threshold of 1.4 volts at 0.5 milliseconds.  Current of threshold was 720 mA.  There was no diaphragmatic pacing at 10 volts and the current of injury was also brisk.  These leads were secured to the prepectoral fascia and then attached to a Biotronik device, model #EVIA DR pulse generator, serial [id number].  Ventricular pacing and then P synchronous pacing were identified.  In fact, the patient had recurrent episodes of TMT requiring reprogramming of the device while the patient was still on the table.  The pocket was copiously irrigated with antibiotic-containing saline solution.  Leads and pulse generator were placed in the pocket and secured to the prepectoral fascia.  The wound was then closed in 3 layers in normal fashion.  The wound was washed and dried and Benzoin and Steri-Strip dressing was applied.  Needle count, sponge count, and instrument counts were correct at the end of the procedure.  However, prior to the dressing of the wound, the previously implanted loop recorder was explanted.  This area was also anesthetized.  An incision was made, carried down to the layer of device pocket.  The device was explanted.  The anchoring sutures were removed.  The pocket was flushed and the wound was closed in 3 layers in normal fashion.  Also, the wound was  washed, dried and Benzoin and Steri-Strip dressing was applied. Needle count, sponge count, and instrument counts as noted were correct at the end of the procedure according to the staff.  The patient tolerated the procedure without apparent complication.     Duke Salvia, MD, [facility]     SCK/MEDQ  D:  09/11/2010  T:  09/12/2010  Job:  [id number]  cc:   Molly Maduro L. Foy [place], M.D.  Electronically Signed by Sherryl Manges MD [facility] on 10/08/2010 04:29:56 PM

## 2010-10-08 NOTE — Discharge Summary (Signed)
Cindy [name], Cindy [name] NO.:  [id number]  MEDICAL RECORD NO.:  [id number]  LOCATION:  2001                         FACILITY:  [facility]  PHYSICIAN:  Duke Salvia, MD, FACCDATE OF BIRTH:  October 22, 1945  DATE OF ADMISSION:  09/11/2010 DATE OF DISCHARGE:  09/12/2010                              DISCHARGE SUMMARY   PRIMARY CARE PHYSICIAN:  Dr. Eliezer Lofts in [place].  PRIMARY ELECTROPHYSIOLOGIST:  Duke Salvia, MD, [facility]  PRIMARY DIAGNOSIS:  Syncope with documented complete heart block.  SECONDARY DIAGNOSES: 1. Hypertension. 2. History of Graves disease. 3. Depression.  ALLERGIES:  The patient is allergic to CODEINE.  PROCEDURES THIS ADMISSION:  Explantation of a previously implanted implantable loop recorder, implantation of a dual-chamber pacemaker by Dr. Graciela Husbands on September 11, 2010.  The patient received a Biotronik EVIA DR pacemaker with model number 5076 right atrial and right ventricular lead.  The patient's loop recorder was also removed during this procedure.  The patient had no early apparent complications.  BRIEF HISTORY OF PRESENT ILLNESS:  Cindy [name] is a 65 year old female with a history of syncope.  Because of her symptoms, she underwent loop recorder implantation.  She had another episode of syncope after her loop recorder was implanted.  Her loop recorder demonstrated an asymptomatic pause followed by complete heart block associated with QT prolongation.  Because of the syncope, pacemaker implantation was recommended to the patient.  Risks, benefits, and alternatives of this procedure were reviewed and the patient wished to proceed.  HOSPITAL COURSE:  The patient was admitted on September 11, 2010, for planned implantation of a pacemaker.  This was carried out by Dr. Graciela Husbands with details as outlined above.  She was monitored on telemetry overnight which demonstrated atrial pacing.  Chest x-ray was obtained which demonstrated no pneumothorax, status  post pacemaker implant.  The patient's device was interrogated and found to be functioning normally. Her left chest was without hematoma or ecchymosis.  Dr. Graciela Husbands examined the patient on September 12, 2010, and considered stable for discharge.  FOLLOWUP APPOINTMENTS: 1. [facility] on September 23, 2010, at 2 p.m.  At this time, her rate should be decreased to    50bpm, the Biotronik rep has been made aware. 2. Dr. Graciela Husbands in September - the office will call to scheduled that     appointment.  DISCHARGE INSTRUCTIONS: 1. Increase activity slowly. 2. No driving for one week. 3. Follow low-sodium heart-healthy diet. 4. See supplemental device discharge instructions for wound care and     arm mobility. 5. Keep incision clean and dry for 1 week.  DISCHARGE MEDICATIONS: 1. Aspirin 81 mg daily. 2. Lisinopril 5 mg 3 tablets daily. 3. Multivitamin daily. 4. Omega-3 daily. 5. Omeprazole 20 mg daily. 6. Paxil 20 mg daily. 7. Maxzide 37.5/25 mg daily. 8. Vitamin C daily. 9. Vitamin D3 daily.  DISPOSITION:  The patient was seen and examined by Dr. Graciela Husbands on September 12, 2010, and considered stable for discharge.  DURATION OF DISCHARGE ENCOUNTER:  Thirty five minutes.     Gypsy Balsam, RN,BSN   ______________________________ Duke Salvia, MD, [facility]    AS/MEDQ  D:  09/12/2010  T:  09/13/2010  Job:  [id number]  cc:   Dr. Eliezer Lofts in [place].  Electronically Signed by Gypsy Balsam RNBSN on 09/13/2010 03:45:44 PM Electronically Signed by Sherryl Manges MD [facility] on 10/08/2010 04:29:59 PM

## 2010-12-04 LAB — HM MAMMOGRAPHY

## 2010-12-24 ENCOUNTER — Encounter: Payer: BC Managed Care – PPO | Admitting: Internal Medicine

## 2010-12-25 ENCOUNTER — Encounter: Payer: BC Managed Care – PPO | Admitting: *Deleted

## 2010-12-27 LAB — LIPID PANEL
HDL: 70 mg/dL (ref 35–70)
LDL Cholesterol: 108 mg/dL
Triglycerides: 142 mg/dL (ref 40–160)

## 2010-12-30 ENCOUNTER — Encounter: Payer: Self-pay | Admitting: Internal Medicine

## 2010-12-31 ENCOUNTER — Encounter: Payer: Self-pay | Admitting: Internal Medicine

## 2010-12-31 ENCOUNTER — Ambulatory Visit (INDEPENDENT_AMBULATORY_CARE_PROVIDER_SITE_OTHER): Payer: BC Managed Care – PPO | Admitting: Internal Medicine

## 2010-12-31 DIAGNOSIS — Z950 Presence of cardiac pacemaker: Secondary | ICD-10-CM

## 2010-12-31 DIAGNOSIS — L910 Hypertrophic scar: Secondary | ICD-10-CM

## 2010-12-31 DIAGNOSIS — I10 Essential (primary) hypertension: Secondary | ICD-10-CM

## 2010-12-31 DIAGNOSIS — I442 Atrioventricular block, complete: Secondary | ICD-10-CM

## 2010-12-31 DIAGNOSIS — I4949 Other premature depolarization: Secondary | ICD-10-CM

## 2010-12-31 DIAGNOSIS — R55 Syncope and collapse: Secondary | ICD-10-CM

## 2010-12-31 HISTORY — DX: Hypertrophic scar: L91.0

## 2010-12-31 LAB — PACEMAKER DEVICE OBSERVATION
AL IMPEDENCE PM: 390 Ohm
ATRIAL PACING PM: 58
BAMS-0002: 5 ms
RV LEAD IMPEDENCE PM: 487 Ohm
RV LEAD THRESHOLD: 0.8 V

## 2010-12-31 NOTE — Progress Notes (Signed)
  HPI  Cindy [name] is a 65 y.o. female Seen in followup for syncope  She has a remote history of vasovagal syncope, including following pain, phlebotomy, and IUD insertion.  Because of further episodes that occurred while eating and sitting she underwent loop recorder implantation which subsequently demonstrated a syncopal episode with a very long pause. Based on the issue-3 trial she underwent pacemaker implantation receiving a Biotronik CLS device. She has had no intercurrent episodes.      Past Medical History  Diagnosis Date  . GERD (gastroesophageal reflux disease)   . Hypertension     No past surgical history on file.  Current Outpatient Prescriptions  Medication Sig Dispense Refill  . ALPRAZolam (XANAX) 0.25 MG tablet Take 1 tablet by mouth daily.       Marland Kitchen aspirin 81 MG tablet Take 81 mg by mouth daily.        . cholecalciferol (VITAMIN D) 1000 UNITS tablet Take 1,000 Units by mouth daily.        . fish oil-omega-3 fatty acids 1000 MG capsule Take 1 g by mouth daily.        . lansoprazole (PREVACID) 30 MG capsule Take 30 mg by mouth daily.        Marland Kitchen lisinopril (PRINIVIL,ZESTRIL) 5 MG tablet Take 15 mg by mouth daily.        Marland Kitchen loratadine (CLARITIN) 10 MG tablet Take 10 mg by mouth daily.        . magnesium oxide (MAG-OX) 400 MG tablet Take 400 mg by mouth daily.        . Multiple Vitamin (MULTIVITAMIN) tablet Take 1 tablet by mouth daily.        . naproxen (NAPROSYN) 250 MG tablet Take 250 mg by mouth as needed.        Marland Kitchen omeprazole (PRILOSEC) 20 MG capsule Take 20 mg by mouth daily.        Marland Kitchen PARoxetine (PAXIL) 20 MG tablet Take 20 mg by mouth every morning.        . triamterene-hydrochlorothiazide (DYAZIDE) 37.5-25 MG per capsule Take 1 capsule by mouth every morning.        . vitamin C (ASCORBIC ACID) 500 MG tablet Take 500 mg by mouth daily.          Allergies  Allergen Reactions  . Codeine     Review of Systems negative except from HPI and PMH  Physical  Exam Well developed and well nourished in no acute distress HENT normal E scleral and icterus clear Neck Supple Small keloid along the incision JVP flat; carotids brisk and full Clear to ausculation Regular rate and rhythm, no murmurs gallops or rub Soft with active bowel sounds No clubbing cyanosis and edema Alert and oriented, grossly normal motor and sensory function Skin Warm and Dry     Assessment and  Plan

## 2010-12-31 NOTE — Assessment & Plan Note (Signed)
The patient's device was interrogated and the information was fully reviewed.  The device was reprogrammed to X. Mild longevity. She was also noted to be atrially pacing 58% of the time which is her CLS algorithm. We decreased sensitivity from very high-high area of  He is also noted to have 3% PVCs.

## 2010-12-31 NOTE — Patient Instructions (Signed)
Your physician wants you to follow-up in: June 2013 with Dr. Klein. You will receive a reminder letter in the mail two months in advance. If you don't receive a letter, please call our office to schedule the follow-up appointment.  Your physician recommends that you continue on your current medications as directed. Please refer to the Current Medication list given to you today.  

## 2010-12-31 NOTE — Assessment & Plan Note (Addendum)
She asked what the appropriate target of therapy as. Based on the diabetic trials, I said they were not sure that more aggressive targets are appropriate. Her blood pressure ranges on 120-35 range. I think that this is probably reasonable

## 2010-12-31 NOTE — Assessment & Plan Note (Signed)
As above we will keep an eye on the frequency of this as we decrease atrial pacing rates

## 2010-12-31 NOTE — Assessment & Plan Note (Signed)
No recurrent syncope 

## 2011-04-08 DIAGNOSIS — E0500 Thyrotoxicosis with diffuse goiter without thyrotoxic crisis or storm: Secondary | ICD-10-CM

## 2011-04-08 HISTORY — DX: Thyrotoxicosis with diffuse goiter without thyrotoxic crisis or storm: E05.00

## 2011-04-08 LAB — HM PAP SMEAR: HM Pap smear: NORMAL

## 2011-04-28 ENCOUNTER — Encounter: Payer: Self-pay | Admitting: Internal Medicine

## 2011-04-28 LAB — LIPID PANEL
LDL Cholesterol: 107 mg/dL
LDl/HDL Ratio: 25

## 2011-04-28 LAB — BASIC METABOLIC PANEL
BUN: 22 mg/dL — AB (ref 4–21)
Glucose: 99 mg/dL

## 2011-04-28 LAB — CBC AND DIFFERENTIAL
HCT: 38 % (ref 36–46)
Platelets: 263 10*3/uL (ref 150–399)

## 2011-04-28 LAB — HEMOGLOBIN A1C: Hgb A1c MFr Bld: 5.6 % (ref 4.0–6.0)

## 2011-04-28 LAB — HEPATIC FUNCTION PANEL: Bilirubin, Total: 0.6 mg/dL

## 2011-05-26 ENCOUNTER — Encounter: Payer: Self-pay | Admitting: *Deleted

## 2011-05-26 ENCOUNTER — Emergency Department
Admit: 2011-05-26 | Discharge: 2011-05-26 | Disposition: A | Discharge status: Home / Self Care | Payer: BC Managed Care – PPO

## 2011-05-26 ENCOUNTER — Emergency Department
Admission: EM | Admit: 2011-05-26 | Discharge: 2011-05-26 | Disposition: A | Discharge status: Home Health | Payer: BC Managed Care – PPO | Source: Home / Self Care | Attending: Emergency Medicine | Admitting: Emergency Medicine

## 2011-05-26 DIAGNOSIS — R05 Cough: Secondary | ICD-10-CM

## 2011-05-26 DIAGNOSIS — R059 Cough, unspecified: Secondary | ICD-10-CM

## 2011-05-26 HISTORY — DX: Unspecified osteoarthritis, unspecified site: M19.90

## 2011-05-26 MED ORDER — AZITHROMYCIN 250 MG PO TABS: ORAL_TABLET | ORAL | Status: AC

## 2011-05-26 MED ORDER — HYDROCODONE-HOMATROPINE 5-1.5 MG/5ML PO SYRP: 5.0000 mL | ORAL_SOLUTION | Freq: Four times a day (QID) | ORAL | Status: AC | PRN

## 2011-05-26 NOTE — ED Notes (Signed)
Pt c/o fever, productive cough and fatigue x 3 days. She has taken mucinex, IBF, nyquil and dayquil.

## 2011-05-26 NOTE — ED Provider Notes (Signed)
History     CSN: [id number]  Arrival date & time 05/26/11  1557   First MD Initiated Contact with Patient 05/26/11 1608      Chief Complaint  Patient presents with  . Fever  . Cough    (Consider location/radiation/quality/duration/timing/severity/associated sxs/prior treatment) HPI Cindy [name] is a 66 y.o. female who complains of onset of cold symptoms for 4 days.  She has had the pneumovax. No sore throat + cough No pleuritic pain No wheezing No nasal congestion No post-nasal drainage No sinus pain/pressure + chest congestion No itchy/red eyes No earache No hemoptysis No SOB No chills/sweats + fever (100-101) No nausea No vomiting No abdominal pain No diarrhea No skin rashes No fatigue No myalgias No headache    Past Medical History  Diagnosis Date  . Hypertension   . Arthritis     Past Surgical History  Procedure Date  . Pacemaker insertion   . Appendectomy     Family History  Problem Relation Age of Onset  . Rheum arthritis Mother   . Emphysema Mother   . Cancer Father     prostate    History  Substance Use Topics  . Smoking status: Former Games developer  . Smokeless tobacco: Not on file  . Alcohol Use: Yes    OB History    Grav Para Term Preterm Abortions TAB SAB Ect Mult Living                  Review of Systems  All other systems reviewed and are negative.    Allergies  Codeine  Home Medications   Current Outpatient Rx  Name Route Sig Dispense Refill  . LISINOPRIL 40 MG PO TABS Oral Take 40 mg by mouth daily.    . TRIAMTERENE-HCTZ 37.5-25 MG PO CAPS Oral Take 1 capsule by mouth every morning.    . AZITHROMYCIN 250 MG PO TABS  Use as directed 1 each 0  . HYDROCODONE-HOMATROPINE 5-1.5 MG/5ML PO SYRP Oral Take 5 mLs by mouth every 6 (six) hours as needed for cough. 120 mL 0    BP 122/78  Pulse 105  Temp(Src) 99.4 F (37.4 C) (Oral)  Resp 18  Ht 5\' 2"  (1.575 m)  Wt 156 lb 8 oz (70.988 kg)  BMI 28.62 kg/m2  SpO2 95%  Physical  Exam  Nursing note and vitals reviewed. Constitutional: She is oriented to person, place, and time. She appears well-developed and well-nourished.  HENT:  Head: Normocephalic and atraumatic.  Right Ear: Tympanic membrane, external ear and ear canal normal.  Left Ear: Tympanic membrane, external ear and ear canal normal.  Nose: Mucosal edema and rhinorrhea present.  Mouth/Throat: Posterior oropharyngeal erythema present. No oropharyngeal exudate or posterior oropharyngeal edema.  Eyes: No scleral icterus.  Neck: Neck supple.  Cardiovascular: Regular rhythm and normal heart sounds.   Pulmonary/Chest: Effort normal. No respiratory distress. She has no decreased breath sounds. She has no wheezes. She has rhonchi in the right lower field and the left lower field.  Neurological: She is alert and oriented to person, place, and time.  Skin: Skin is warm and dry.  Psychiatric: She has a normal mood and affect. Her speech is normal.    ED Course  Procedures (including critical care time)  Labs Reviewed - No data to display Dg Chest 2 View  05/26/2011  *RADIOLOGY REPORT*  Clinical Data: Fever, cough.  CHEST - 2 VIEW  Comparison: None  Findings: Peribronchial thickening. Heart and mediastinal contours are within normal limits.  No focal opacities or effusions.  No acute bony abnormality.  Left pacer in place with leads in the right atrium and right ventricle.  IMPRESSION: Bronchitic changes.  Original Report Authenticated By: Cyndie Chime, M.D.     1. Cough       MDM  1)  an x-ray is performed and read by radiology as above.  Rx for Zpak and Hydromet given (she is allergic to codeine but has taken hydrocodone and oxycodone before without reaction) 2)  Use nasal saline solution (over the counter) at least 3 times a day. 3)  Use over the counter decongestants like Zyrtec-D every 12 hours as needed to help with congestion.  If you have hypertension, do not take medicines with sudafed.  4)  Can  take tylenol every 6 hours or motrin every 8 hours for pain or fever. 5)  Follow up with your primary doctor if no improvement in 5-7 days, sooner if increasing pain, fever, or new symptoms.        Lily Kocher, MD 05/26/11 [phone]

## 2011-06-25 ENCOUNTER — Encounter: Payer: Self-pay | Admitting: Internal Medicine

## 2011-10-02 ENCOUNTER — Encounter: Payer: Self-pay | Admitting: Internal Medicine

## 2011-10-02 ENCOUNTER — Ambulatory Visit (INDEPENDENT_AMBULATORY_CARE_PROVIDER_SITE_OTHER): Payer: BC Managed Care – PPO | Admitting: Internal Medicine

## 2011-10-02 VITALS — BP 134/75 | HR 79 | Ht 64.0 in | Wt 154.0 lb

## 2011-10-02 DIAGNOSIS — I4949 Other premature depolarization: Secondary | ICD-10-CM

## 2011-10-02 DIAGNOSIS — R55 Syncope and collapse: Secondary | ICD-10-CM

## 2011-10-02 DIAGNOSIS — Z950 Presence of cardiac pacemaker: Secondary | ICD-10-CM

## 2011-10-02 DIAGNOSIS — I10 Essential (primary) hypertension: Secondary | ICD-10-CM

## 2011-10-02 LAB — PACEMAKER DEVICE OBSERVATION
AL IMPEDENCE PM: 390 Ohm
AL THRESHOLD: 0.6 V
BAMS-0001: 160 {beats}/min
BAMS-0002: 5 ms
RV LEAD THRESHOLD: 0.9 V

## 2011-10-02 NOTE — Progress Notes (Signed)
  HPI  Cindy [name] is a 66 y.o. female Seen in followup for syncope She has a remote history of vasovagal syncope, including following pain, phlebotomy, and IUD insertion.  Because of further episodes that occurred while eating and sitting she underwent loop recorder implantation which subsequently demonstrated a syncopal episode with a very long pause. Based on the issue-3 trial she underwent pacemaker implantation receiving a Biotronik CLS device. She has had no intercurrent episodes.  She has some bradycardia but no syncope   Past Medical History  Diagnosis Date  . GERD (gastroesophageal reflux disease)   . Hypertension   . Arthritis     Past Surgical History  Procedure Date  . Pacemaker insertion   . Appendectomy     Current Outpatient Prescriptions  Medication Sig Dispense Refill  . ALPRAZolam (XANAX) 0.25 MG tablet Take 1 tablet by mouth as needed.       Marland Kitchen aspirin 81 MG tablet Take 81 mg by mouth daily.        Marland Kitchen b complex vitamins tablet Take 1 tablet by mouth daily.      . cholecalciferol (VITAMIN D) 1000 UNITS tablet Take 1,000 Units by mouth daily.        . fish oil-omega-3 fatty acids 1000 MG capsule Take 1 g by mouth daily.        . lansoprazole (PREVACID) 30 MG capsule Take 30 mg by mouth daily.        Marland Kitchen lisinopril (PRINIVIL,ZESTRIL) 20 MG tablet Take 20 mg by mouth daily.      Marland Kitchen loratadine (CLARITIN) 10 MG tablet Take 10 mg by mouth as needed.       . magnesium oxide (MAG-OX) 400 MG tablet Take 400 mg by mouth daily.        . Multiple Vitamin (MULTIVITAMIN) tablet Take 1 tablet by mouth daily.        . naproxen (NAPROSYN) 250 MG tablet Take 250 mg by mouth as needed.        Marland Kitchen omeprazole (PRILOSEC) 20 MG capsule Take 20 mg by mouth daily.        Marland Kitchen PARoxetine (PAXIL) 20 MG tablet Take 20 mg by mouth every morning.        . triamterene-hydrochlorothiazide (DYAZIDE) 37.5-25 MG per capsule Take 1 capsule by mouth every morning.        . vitamin C (ASCORBIC ACID)  500 MG tablet Take 500 mg by mouth daily.        Marland Kitchen DISCONTD: triamterene-hydrochlorothiazide (DYAZIDE) 37.5-25 MG per capsule Take 1 capsule by mouth every morning.        Allergies  Allergen Reactions  . Codeine   . Codeine     Review of Systems negative except from HPI and PMH  Physical Exam BP 134/75  Pulse 79  Ht 5\' 4"  (1.626 m)  Wt 154 lb (69.854 kg)  BMI 26.43 kg/m2 Well developed and well nourished in no acute distress HENT normal E scleral and icterus clear Neck Supple JVP flat; carotids brisk and full Clear to ausculation Regular rate and rhythm, no murmurs gallops or rub Soft with active bowel sounds No clubbing cyanosis none Edema Alert and oriented, grossly normal motor and sensory function Skin Warm and Dry  ECG: Sinus Rhythm/ with pvc  @70             Intervals  16/07/39  Axis 62     Assessment and  Plan

## 2011-10-02 NOTE — Assessment & Plan Note (Signed)
No intercurrent syncope 

## 2011-10-02 NOTE — Patient Instructions (Signed)
Your physician wants you to follow-up in: 6 months with Kristin/Paula for a device check & 1 year with Dr. Klein. You will receive a reminder letter in the mail two months in advance. If you don't receive a letter, please call our office to schedule the follow-up appointment.  Your physician recommends that you continue on your current medications as directed. Please refer to the Current Medication list given to you today.  

## 2011-10-02 NOTE — Assessment & Plan Note (Signed)
The patient's device was interrogated.  The information was reviewed. No changes were made in the programming.    

## 2011-10-02 NOTE — Assessment & Plan Note (Signed)
Still without symptoms

## 2011-10-02 NOTE — Assessment & Plan Note (Signed)
stable °

## 2011-10-06 DIAGNOSIS — E0590 Thyrotoxicosis, unspecified without thyrotoxic crisis or storm: Secondary | ICD-10-CM | POA: Insufficient documentation

## 2011-10-08 NOTE — Addendum Note (Signed)
Addended by: Early Chars on: 10/08/2011 09:37 AM   Modules accepted: Orders

## 2011-11-05 ENCOUNTER — Encounter: Payer: Self-pay | Admitting: Internal Medicine

## 2011-12-31 ENCOUNTER — Other Ambulatory Visit (HOSPITAL_COMMUNITY): Payer: Self-pay | Admitting: Endocrinology

## 2011-12-31 DIAGNOSIS — E0590 Thyrotoxicosis, unspecified without thyrotoxic crisis or storm: Secondary | ICD-10-CM

## 2012-01-12 ENCOUNTER — Encounter (HOSPITAL_COMMUNITY)
Admission: RE | Admit: 2012-01-12 | Discharge: 2012-01-12 | Disposition: A | Discharge status: Home / Self Care | Payer: BC Managed Care – PPO | Source: Ambulatory Visit | Attending: Endocrinology | Admitting: Endocrinology

## 2012-01-12 ENCOUNTER — Other Ambulatory Visit (HOSPITAL_COMMUNITY): Payer: Self-pay | Admitting: Endocrinology

## 2012-01-12 DIAGNOSIS — E0590 Thyrotoxicosis, unspecified without thyrotoxic crisis or storm: Secondary | ICD-10-CM

## 2012-01-13 ENCOUNTER — Encounter (HOSPITAL_COMMUNITY)
Admission: RE | Admit: 2012-01-13 | Discharge: 2012-01-13 | Disposition: A | Discharge status: Home / Self Care | Payer: BC Managed Care – PPO | Source: Ambulatory Visit | Attending: Endocrinology | Admitting: Endocrinology

## 2012-01-13 DIAGNOSIS — E0590 Thyrotoxicosis, unspecified without thyrotoxic crisis or storm: Secondary | ICD-10-CM | POA: Insufficient documentation

## 2012-01-13 MED ORDER — SODIUM PERTECHNETATE TC 99M INJECTION
9.7000 | Freq: Once | INTRAVENOUS | Status: AC | PRN
  Administered 2012-01-13: 10 via INTRAVENOUS

## 2012-01-13 MED ORDER — SODIUM IODIDE I 131 CAPSULE
18.0000 | Freq: Once | INTRAVENOUS | Status: AC | PRN
  Administered 2012-01-12: 18 via ORAL

## 2012-01-26 LAB — CBC AND DIFFERENTIAL
HCT: 37 % (ref 36–46)
Hemoglobin: 12 g/dL (ref 12.0–16.0)

## 2012-01-29 ENCOUNTER — Other Ambulatory Visit: Payer: Self-pay | Admitting: Endocrinology

## 2012-01-29 DIAGNOSIS — E0590 Thyrotoxicosis, unspecified without thyrotoxic crisis or storm: Secondary | ICD-10-CM

## 2012-02-05 ENCOUNTER — Encounter (HOSPITAL_COMMUNITY)
Admission: RE | Admit: 2012-02-05 | Discharge: 2012-02-05 | Disposition: A | Discharge status: Home / Self Care | Payer: BC Managed Care – PPO | Source: Ambulatory Visit | Attending: Endocrinology | Admitting: Endocrinology

## 2012-02-05 DIAGNOSIS — E0590 Thyrotoxicosis, unspecified without thyrotoxic crisis or storm: Secondary | ICD-10-CM | POA: Insufficient documentation

## 2012-02-05 MED ORDER — SODIUM IODIDE I 131 CAPSULE
30.8000 | Freq: Once | INTRAVENOUS | Status: AC | PRN
  Administered 2012-02-05: 30.8 via ORAL

## 2012-04-05 ENCOUNTER — Encounter: Payer: Self-pay | Admitting: Internal Medicine

## 2012-04-05 ENCOUNTER — Ambulatory Visit (INDEPENDENT_AMBULATORY_CARE_PROVIDER_SITE_OTHER): Payer: BC Managed Care – PPO | Admitting: *Deleted

## 2012-04-05 DIAGNOSIS — I498 Other specified cardiac arrhythmias: Secondary | ICD-10-CM

## 2012-04-05 LAB — PACEMAKER DEVICE OBSERVATION
AL AMPLITUDE: 4.2 mv
AL IMPEDENCE PM: 351 Ohm
ATRIAL PACING PM: 56
RV LEAD AMPLITUDE: 13.6 mv
RV LEAD IMPEDENCE PM: 468 Ohm
VENTRICULAR PACING PM: 3

## 2012-04-05 NOTE — Progress Notes (Signed)
Pacer check in clinic  

## 2012-04-07 HISTORY — PX: CATARACT EXTRACTION: SUR2

## 2012-05-03 LAB — LIPID PANEL
LDL Cholesterol: 143 mg/dL
LDl/HDL Ratio: 22

## 2012-05-03 LAB — HEPATIC FUNCTION PANEL
AST: 16 U/L (ref 13–35)
Bilirubin, Total: 0.4 mg/dL

## 2012-05-03 LAB — CBC AND DIFFERENTIAL: Platelets: 272 10*3/uL (ref 150–399)

## 2012-05-03 LAB — BASIC METABOLIC PANEL
BUN: 28 mg/dL — AB (ref 4–21)
Creatinine: 0.9 mg/dL (ref 0.5–1.1)
Potassium: 4 mmol/L (ref 3.4–5.3)

## 2012-07-07 LAB — HM MAMMOGRAPHY

## 2012-10-01 ENCOUNTER — Encounter: Payer: Self-pay | Admitting: Internal Medicine

## 2012-10-01 ENCOUNTER — Ambulatory Visit (INDEPENDENT_AMBULATORY_CARE_PROVIDER_SITE_OTHER): Payer: BC Managed Care – PPO | Admitting: Internal Medicine

## 2012-10-01 VITALS — BP 106/64 | HR 68 | Ht 62.0 in | Wt 155.4 lb

## 2012-10-01 DIAGNOSIS — R55 Syncope and collapse: Secondary | ICD-10-CM

## 2012-10-01 DIAGNOSIS — Z950 Presence of cardiac pacemaker: Secondary | ICD-10-CM

## 2012-10-01 DIAGNOSIS — I10 Essential (primary) hypertension: Secondary | ICD-10-CM

## 2012-10-01 LAB — PACEMAKER DEVICE OBSERVATION
AL AMPLITUDE: 5.5 mv
AL THRESHOLD: 0.8 V
ATRIAL PACING PM: 61
BAMS-0001: 160 {beats}/min
BAMS-0002: 5 ms
DEVICE MODEL PM: 66089338
RV LEAD THRESHOLD: 0.9 V
RV LEAD THRESHOLD: 0.9 V

## 2012-10-01 NOTE — Progress Notes (Signed)
Patient Care Team: Daisy Floro, MD as PCP - General (Internal Medicine)   HPI  Cindy [name] is a 67 y.o. female Seen in followup for syncope She has a remote history of vasovagal syncope, including following pain, phlebotomy, and IUD insertion.  Because of further episodes that occurred while eating and sitting she underwent loop recorder implantation which subsequently demonstrated a syncopal episode with a very long pause. Based on the issue-3 trial she underwent pacemaker implantation receiving a Biotronik CLS device.  She has had one episode of recurrent syncope. This occurred on New Year's Eve. He was drinking Systems developer. She was seated. She became dizzy.. She passed out.  She has intercurrently been diagnosed with Graves' disease is undergone radioactive ablation      Past Medical History  Diagnosis Date  . GERD (gastroesophageal reflux disease)   . Hypertension   . Arthritis     Past Surgical History  Procedure Laterality Date  . Pacemaker insertion    . Appendectomy      Current Outpatient Prescriptions  Medication Sig Dispense Refill  . aspirin 81 MG tablet Take 81 mg by mouth daily.        Marland Kitchen b complex vitamins tablet Take 1 tablet by mouth daily.      . cholecalciferol (VITAMIN D) 1000 UNITS tablet Take 1,000 Units by mouth daily.        . diazepam (VALIUM) 5 MG tablet Take 5 mg by mouth as needed for anxiety.      Marland Kitchen levothyroxine (SYNTHROID, LEVOTHROID) 125 MCG tablet Take 125 mcg by mouth daily before breakfast.      . lisinopril (PRINIVIL,ZESTRIL) 20 MG tablet Take 20 mg by mouth daily.      Marland Kitchen loratadine (CLARITIN) 10 MG tablet Take 10 mg by mouth as needed.       . magnesium oxide (MAG-OX) 400 MG tablet Take 500 mg by mouth daily.       . metoprolol tartrate (LOPRESSOR) 25 MG tablet Take 25 mg by mouth daily.      . naproxen (NAPROSYN) 250 MG tablet Take 250 mg by mouth as needed.        Marland Kitchen omeprazole (PRILOSEC) 20 MG capsule Take 10 mg by mouth daily.        Marland Kitchen PARoxetine (PAXIL) 20 MG tablet Take 10 mg by mouth every morning.       . triamterene-hydrochlorothiazide (DYAZIDE) 37.5-25 MG per capsule Take 1 capsule by mouth every morning.        . Multiple Vitamin (MULTIVITAMIN) tablet Take 1 tablet by mouth daily.        . vitamin C (ASCORBIC ACID) 500 MG tablet Take 500 mg by mouth daily.         No current facility-administered medications for this visit.    Allergies  Allergen Reactions  . Codeine   . Codeine     Review of Systems negative except from HPI and PMH  Physical Exam Pulse 68  Ht 5\' 2"  (1.575 m)  Wt 155 lb 6.4 oz (70.489 kg)  BMI 28.42 kg/m2 Well developed and nourished in no acute distress HENT normal Neck supple with JVP-flat Clear Regular rate and rhythm, no murmurs or gallops Abd-soft with active BS No Clubbing cyanosis edema Skin-warm and dry A & Oriented  Grossly normal sensory and motor function  ECG demonstrates atrial pacing with intrinsic conduction and occasional ventricular paced beats  Assessment and  Plan

## 2012-10-01 NOTE — Assessment & Plan Note (Signed)
We reviewed again the physiology of neurally mediated syncope and a combination of alcohol heat conspired together with her almost certainly to cause her syncope.

## 2012-10-01 NOTE — Assessment & Plan Note (Signed)
Well controlled 

## 2012-10-01 NOTE — Assessment & Plan Note (Signed)
The patient's device was interrogated.  The information was reviewed. No changes were made in the programming.    

## 2012-10-01 NOTE — Patient Instructions (Addendum)
Your physician recommends that you continue on your current medications as directed. Please refer to the Current Medication list given to you today.  Your physician wants you to follow-up in: 1 year with Dr. Klein.  You will receive a reminder letter in the mail two months in advance. If you don't receive a letter, please call our office to schedule the follow-up appointment.  

## 2012-11-02 LAB — HM DEXA SCAN

## 2013-01-05 ENCOUNTER — Encounter: Payer: Self-pay | Admitting: Internal Medicine

## 2013-03-07 ENCOUNTER — Encounter: Payer: Self-pay | Admitting: Internal Medicine

## 2013-03-07 ENCOUNTER — Ambulatory Visit (INDEPENDENT_AMBULATORY_CARE_PROVIDER_SITE_OTHER): Payer: BC Managed Care – PPO | Admitting: *Deleted

## 2013-03-07 DIAGNOSIS — R55 Syncope and collapse: Secondary | ICD-10-CM

## 2013-03-07 LAB — MDC_IDC_ENUM_SESS_TYPE_INCLINIC
Battery Remaining Percentage: 80 %
Brady Statistic RA Percent Paced: 60 %
Brady Statistic RV Percent Paced: 3 %
Lead Channel Impedance Value: 370 Ohm
Lead Channel Impedance Value: 448 Ohm
Lead Channel Sensing Intrinsic Amplitude: 6.3 mV
Lead Channel Setting Pacing Amplitude: 2.4 V
Lead Channel Setting Pacing Pulse Width: 0.4 ms

## 2013-03-10 NOTE — Progress Notes (Signed)
Pacemaker check in clinic. Normal device function. Thresholds, sensing, impedances consistent with previous measurements. Device programmed to maximize longevity. 9 mode switch episodes recorded---brief AT episodes. 2 high ventricular rates noted---SVT/far field---V blank after A pace was increased from 35ms to 55ms. Device programmed at appropriate safety margins. Histogram distribution appropriate for patient activity level. Device programmed to optimize intrinsic conduction. Estimated longevity >10 years. Patient will follow up with SK in 6 months.

## 2013-03-29 ENCOUNTER — Encounter: Payer: Self-pay | Admitting: Internal Medicine

## 2013-04-20 ENCOUNTER — Encounter: Payer: Self-pay | Admitting: Internal Medicine

## 2013-04-20 ENCOUNTER — Ambulatory Visit (INDEPENDENT_AMBULATORY_CARE_PROVIDER_SITE_OTHER): Payer: BC Managed Care – PPO | Admitting: Internal Medicine

## 2013-04-20 ENCOUNTER — Other Ambulatory Visit (INDEPENDENT_AMBULATORY_CARE_PROVIDER_SITE_OTHER): Payer: BC Managed Care – PPO

## 2013-04-20 VITALS — BP 132/72 | HR 70 | Temp 98.0°F | Ht 62.0 in | Wt 153.0 lb

## 2013-04-20 DIAGNOSIS — Z0000 Encounter for general adult medical examination without abnormal findings: Secondary | ICD-10-CM

## 2013-04-20 DIAGNOSIS — R7309 Other abnormal glucose: Secondary | ICD-10-CM

## 2013-04-20 DIAGNOSIS — R739 Hyperglycemia, unspecified: Secondary | ICD-10-CM

## 2013-04-20 DIAGNOSIS — E890 Postprocedural hypothyroidism: Secondary | ICD-10-CM

## 2013-04-20 DIAGNOSIS — I10 Essential (primary) hypertension: Secondary | ICD-10-CM

## 2013-04-20 LAB — LIPID PANEL
Cholesterol: 222 mg/dL — ABNORMAL HIGH (ref 0–200)
HDL: 77.4 mg/dL (ref >39.00)
Total CHOL/HDL Ratio: 3
Triglycerides: 101 mg/dL (ref 0.0–149.0)
VLDL: 20.2 mg/dL (ref 0.0–40.0)

## 2013-04-20 LAB — LDL CHOLESTEROL, DIRECT: Direct LDL: 126.8 mg/dL

## 2013-04-20 LAB — CBC WITH DIFFERENTIAL/PLATELET
BASOS ABS: 0 10*3/uL (ref 0.0–0.1)
Basophils Relative: 1 % (ref 0.0–3.0)
EOS PCT: 1.1 % (ref 0.0–5.0)
Eosinophils Absolute: 0 10*3/uL (ref 0.0–0.7)
HCT: 34.6 % — ABNORMAL LOW (ref 36.0–46.0)
Hemoglobin: 11.4 g/dL — ABNORMAL LOW (ref 12.0–15.0)
Lymphocytes Relative: 42.6 % (ref 12.0–46.0)
Lymphs Abs: 1.6 10*3/uL (ref 0.7–4.0)
MCHC: 33.1 g/dL (ref 30.0–36.0)
MCV: 81.2 fl (ref 78.0–100.0)
MONO ABS: 0.3 10*3/uL (ref 0.1–1.0)
Monocytes Relative: 7.1 % (ref 3.0–12.0)
NEUTROS PCT: 48.2 % (ref 43.0–77.0)
Neutro Abs: 1.8 10*3/uL (ref 1.4–7.7)
PLATELETS: 360 10*3/uL (ref 150.0–400.0)
RBC: 4.26 Mil/uL (ref 3.87–5.11)
RDW: 16.2 % — ABNORMAL HIGH (ref 11.5–14.6)
WBC: 3.8 10*3/uL — AB (ref 4.5–10.5)

## 2013-04-20 LAB — URINALYSIS, ROUTINE W REFLEX MICROSCOPIC
Bilirubin Urine: NEGATIVE
HGB URINE DIPSTICK: NEGATIVE
Ketones, ur: NEGATIVE
NITRITE: NEGATIVE
Specific Gravity, Urine: 1.015 (ref 1.000–1.030)
TOTAL PROTEIN, URINE-UPE24: NEGATIVE
UROBILINOGEN UA: 0.2 (ref 0.0–1.0)
Urine Glucose: NEGATIVE
pH: 8 (ref 5.0–8.0)

## 2013-04-20 LAB — BASIC METABOLIC PANEL
BUN: 25 mg/dL — AB (ref 6–23)
CHLORIDE: 100 meq/L (ref 96–112)
CO2: 29 mEq/L (ref 19–32)
Calcium: 9.4 mg/dL (ref 8.4–10.5)
Creatinine, Ser: 0.9 mg/dL (ref 0.4–1.2)
GFR: 69.78 mL/min (ref >60.00)
Glucose, Bld: 98 mg/dL (ref 70–99)
Potassium: 3.7 mEq/L (ref 3.5–5.1)
Sodium: 137 mEq/L (ref 135–145)

## 2013-04-20 LAB — HEPATIC FUNCTION PANEL
ALT: 18 U/L (ref 0–35)
AST: 15 U/L (ref 0–37)
Albumin: 4.3 g/dL (ref 3.5–5.2)
Alkaline Phosphatase: 55 U/L (ref 39–117)
BILIRUBIN DIRECT: 0.1 mg/dL (ref 0.0–0.3)
TOTAL PROTEIN: 8 g/dL (ref 6.0–8.3)
Total Bilirubin: 1.1 mg/dL (ref 0.3–1.2)

## 2013-04-20 LAB — TSH: TSH: 0.54 u[IU]/mL (ref 0.35–5.50)

## 2013-04-20 LAB — HEMOGLOBIN A1C: Hgb A1c MFr Bld: 5.8 % (ref 4.6–6.5)

## 2013-04-20 MED ORDER — DIAZEPAM 5 MG PO TABS: 5.0000 mg | ORAL_TABLET | ORAL | Status: DC | PRN

## 2013-04-20 MED ORDER — LISINOPRIL 20 MG PO TABS: 20.0000 mg | ORAL_TABLET | Freq: Every day | ORAL | Status: DC

## 2013-04-20 MED ORDER — TRIAMTERENE-HCTZ 37.5-25 MG PO CAPS: 1.0000 | ORAL_CAPSULE | ORAL | Status: DC

## 2013-04-20 NOTE — Assessment & Plan Note (Signed)
Initial diagnosis Graves' disease 2007, spontaneously resolved but recurrent in 2013 Treated with I-131 ablation as per direction of endocrinology Treatment with levothyroxine replacement ongoing by Dr Chalmers Cater History reviewed, no medication changes recommended today Lab Results  Component Value Date   TSH 0.07* 05/03/2012

## 2013-04-20 NOTE — Progress Notes (Signed)
Pre-visit discussion using our clinic review tool. No additional management support is needed unless otherwise documented below in the visit note.  

## 2013-04-20 NOTE — Assessment & Plan Note (Signed)
BP Readings from Last 3 Encounters:  04/20/13 132/72  10/01/12 106/64  10/02/11 134/75   The current medical regimen is effective;  continue present plan and medications.

## 2013-04-20 NOTE — Patient Instructions (Addendum)
It was good to see you today.  We have reviewed your prior records including labs and tests today  Health Maintenance reviewed - all recommended immunizations and age-appropriate screenings are up-to-date.  Test(s) ordered today. Your results will be released to [place] (or called to you) after review, usually within 72hours after test completion. If any changes need to be made, you will be notified at that same time.  Medications reviewed and updated, no changes recommended at this time. Refill on medication(s) as discussed today.  Work on lifestyle changes as discussed (low fat, low carb, increased protein diet; improved exercise efforts; weight loss) to control sugar, blood pressure and cholesterol levels and/or reduce risk of developing other medical problems. Look into http://vang.[email or web address] or other type of food journal to assist you in this process.  Please schedule followup in 12 months for annual exam and labs, call sooner if problems.  Health Maintenance, Female A healthy lifestyle and preventative care can promote health and wellness.  Maintain regular health, dental, and eye exams.  Eat a healthy diet. Foods like vegetables, fruits, whole grains, low-fat dairy products, and lean protein foods contain the nutrients you need without too many calories. Decrease your intake of foods high in solid fats, added sugars, and salt. Get information about a proper diet from your caregiver, if necessary.  Regular physical exercise is one of the most important things you can do for your health. Most adults should get at least 150 minutes of moderate-intensity exercise (any activity that increases your heart rate and causes you to sweat) each week. In addition, most adults need muscle-strengthening exercises on 2 or more days a week.   Maintain a healthy weight. The body mass index (BMI) is a screening tool to identify possible weight problems. It provides an estimate of body fat based on height and  weight. Your caregiver can help determine your BMI, and can help you achieve or maintain a healthy weight. For adults 20 years and older:  A BMI below 18.5 is considered underweight.  A BMI of 18.5 to 24.9 is normal.  A BMI of 25 to 29.9 is considered overweight.  A BMI of 30 and above is considered obese.  Maintain normal blood lipids and cholesterol by exercising and minimizing your intake of saturated fat. Eat a balanced diet with plenty of fruits and vegetables. Blood tests for lipids and cholesterol should begin at age 28 and be repeated every 5 years. If your lipid or cholesterol levels are high, you are over 50, or you are a high risk for heart disease, you may need your cholesterol levels checked more frequently.Ongoing high lipid and cholesterol levels should be treated with medicines if diet and exercise are not effective.  If you smoke, find out from your caregiver how to quit. If you do not use tobacco, do not start.  Lung cancer screening is recommended for adults aged 45 80 years who are at high risk for developing lung cancer because of a history of smoking. Yearly low-dose computed tomography (CT) is recommended for people who have at least a 30-pack-year history of smoking and are a current smoker or have quit within the past 15 years. A pack year of smoking is smoking an average of 1 pack of cigarettes a day for 1 year (for example: 1 pack a day for 30 years or 2 packs a day for 15 years). Yearly screening should continue until the smoker has stopped smoking for at least 15 years. Yearly screening should also  be stopped for people who develop a health problem that would prevent them from having lung cancer treatment.  If you are pregnant, do not drink alcohol. If you are breastfeeding, be very cautious about drinking alcohol. If you are not pregnant and choose to drink alcohol, do not exceed 1 drink per day. One drink is considered to be 12 ounces (355 mL) of beer, 5 ounces (148  mL) of wine, or 1.5 ounces (44 mL) of liquor.  Avoid use of street drugs. Do not share needles with anyone. Ask for help if you need support or instructions about stopping the use of drugs.  High blood pressure causes heart disease and increases the risk of stroke. Blood pressure should be checked at least every 1 to 2 years. Ongoing high blood pressure should be treated with medicines, if weight loss and exercise are not effective.  If you are 59 to 68 years old, ask your caregiver if you should take aspirin to prevent strokes.  Diabetes screening involves taking a blood sample to check your fasting blood sugar level. This should be done once every 3 years, after age 36, if you are within normal weight and without risk factors for diabetes. Testing should be considered at a younger age or be carried out more frequently if you are overweight and have at least 1 risk factor for diabetes.  Breast cancer screening is essential preventative care for women. You should practice "breast self-awareness." This means understanding the normal appearance and feel of your breasts and may include breast self-examination. Any changes detected, no matter how small, should be reported to a caregiver. Women in their 33s and 30s should have a clinical breast exam (CBE) by a caregiver as part of a regular health exam every 1 to 3 years. After age 6, women should have a CBE every year. Starting at age 53, women should consider having a mammogram (breast X-ray) every year. Women who have a family history of breast cancer should talk to their caregiver about genetic screening. Women at a high risk of breast cancer should talk to their caregiver about having an MRI and a mammogram every year.  Breast cancer gene (BRCA)-related cancer risk assessment is recommended for women who have family members with BRCA-related cancers. BRCA-related cancers include breast, ovarian, tubal, and peritoneal cancers. Having family members with  these cancers may be associated with an increased risk for harmful changes (mutations) in the breast cancer genes BRCA1 and BRCA2. Results of the assessment will determine the need for genetic counseling and BRCA1 and BRCA2 testing.  The Pap test is a screening test for cervical cancer. Women should have a Pap test starting at age 32. Between ages 26 and 40, Pap tests should be repeated every 2 years. Beginning at age 33, you should have a Pap test every 3 years as long as the past 3 Pap tests have been normal. If you had a hysterectomy for a problem that was not cancer or a condition that could lead to cancer, then you no longer need Pap tests. If you are between ages 76 and 77, and you have had normal Pap tests going back 10 years, you no longer need Pap tests. If you have had past treatment for cervical cancer or a condition that could lead to cancer, you need Pap tests and screening for cancer for at least 20 years after your treatment. If Pap tests have been discontinued, risk factors (such as a new sexual partner) need to be reassessed  to determine if screening should be resumed. Some women have medical problems that increase the chance of getting cervical cancer. In these cases, your caregiver may recommend more frequent screening and Pap tests.  The human papillomavirus (HPV) test is an additional test that may be used for cervical cancer screening. The HPV test looks for the virus that can cause the cell changes on the cervix. The cells collected during the Pap test can be tested for HPV. The HPV test could be used to screen women aged 30 years and older, and should be used in women of any age who have unclear Pap test results. After the age of 30, women should have HPV testing at the same frequency as a Pap test.  Colorectal cancer can be detected and often prevented. Most routine colorectal cancer screening begins at the age of 50 and continues through age 75. However, your caregiver may recommend  screening at an earlier age if you have risk factors for colon cancer. On a yearly basis, your caregiver may provide home test kits to check for hidden blood in the stool. Use of a small camera at the end of a tube, to directly examine the colon (sigmoidoscopy or colonoscopy), can detect the earliest forms of colorectal cancer. Talk to your caregiver about this at age 50, when routine screening begins. Direct examination of the colon should be repeated every 5 to 10 years through age 75, unless early forms of pre-cancerous polyps or small growths are found.  Hepatitis C blood testing is recommended for all people born from 1945 through 1965 and any individual with known risks for hepatitis C.  Practice safe sex. Use condoms and avoid high-risk sexual practices to reduce the spread of sexually transmitted infections (STIs). Sexually active women aged 25 and younger should be checked for Chlamydia, which is a common sexually transmitted infection. Older women with new or multiple partners should also be tested for Chlamydia. Testing for other STIs is recommended if you are sexually active and at increased risk.  Osteoporosis is a disease in which the bones lose minerals and strength with aging. This can result in serious bone fractures. The risk of osteoporosis can be identified using a bone density scan. Women ages 65 and over and women at risk for fractures or osteoporosis should discuss screening with their caregivers. Ask your caregiver whether you should be taking a calcium supplement or vitamin D to reduce the rate of osteoporosis.  Menopause can be associated with physical symptoms and risks. Hormone replacement therapy is available to decrease symptoms and risks. You should talk to your caregiver about whether hormone replacement therapy is right for you.  Use sunscreen. Apply sunscreen liberally and repeatedly throughout the day. You should seek shade when your shadow is shorter than you. Protect  yourself by wearing long sleeves, pants, a wide-brimmed hat, and sunglasses year round, whenever you are outdoors.  Notify your caregiver of new moles or changes in moles, especially if there is a change in shape or color. Also notify your caregiver if a mole is larger than the size of a pencil eraser.  Stay current with your immunizations. Document Released: 10/07/2010 Document Revised: 07/19/2012 Document Reviewed: 10/07/2010 [employer] Patient Information 2014 [employer]. 

## 2013-04-20 NOTE — Progress Notes (Signed)
Subjective:    Patient ID: Cindy [name], female    DOB: [birth year], 68 y.o.   MRN: [id number]  HPI  New patient to me, here to establish care with a primary care provider  patient is here today for annual physical. Patient feels well in general  Also reviewed chronic medical issues  Post ablation hypothyroidism. History of Graves' disease 2013 status post I-131 ablation. Medical management ongoing by endocrinology. Denies unexplained weight changes, bowel changes skin or hair changes or other issues  History of permanent pacemaker 2012 related to syncope. Implanted after loop recorder demonstrated symptomatic pause. Follows periodically with EP physician related to same  hypertension - the patient reports compliance with medication(s) as prescribed. Denies adverse side effects.  GERD - the patient reports compliance with medication(s) as prescribed. Denies adverse side effects.  Hyperglycemia -"borderline diabetes", but never on medications for same. Understands need for diet and exercise to control risk of developing disease. Request A1c check annual  Past Medical History  Diagnosis Date  . Arthritis   . Pacemaker -Biotronik-CLS 08/20/2010  . Keloid 12/31/2010    incisional   . HYPERTENSION   . GRAVES' DISEASE 2013    I-131 ablation, now post tx hypothyroid state  . GERD   . Impaired glucose tolerance test   . Unspecified vitamin D deficiency   . Sciatica   . Anemia     hx iron def, no GI loss indentifed - resolved with diet change  . Syncope 08/20/2010    s/p PPM 08/2010 for sxc pause on loop recorder causing same   Family History  Problem Relation Age of Onset  . Rheum arthritis Mother   . Emphysema Mother   . Hypertension Mother   . Osteoporosis Mother   . Cancer Father     prostate  . Hypertension Father   . Osteoporosis Maternal Grandmother   . Stroke Other    History   Social History  . Marital Status: Married    Spouse Name: N/A    Number of  Children: N/A  . Years of Education: N/A   Occupational History  . Not on file.   Social History Main Topics  . Smoking status: Former Smoker    Types: Cigarettes    Quit date: 04/08/1967  . Smokeless tobacco: Never Used  . Alcohol Use: Yes     Comment: OCCASIONAL  . Drug Use: No  . Sexual Activity: Not on file   Other Topics Concern  . Not on file   Social History Narrative   ** Merged History Encounter **        Review of Systems  Constitutional: Negative for fatigue and unexpected weight change.  Respiratory: Negative for cough, shortness of breath and wheezing.   Cardiovascular: Negative for chest pain, palpitations and leg swelling.  Gastrointestinal: Negative for nausea, abdominal pain and diarrhea.  Neurological: Negative for dizziness, weakness, light-headedness and headaches.  Psychiatric/Behavioral: Negative for dysphoric mood. The patient is not nervous/anxious.   All other systems reviewed and are negative.       Objective:   Physical Exam BP 132/72  Pulse 70  Temp(Src) 98 F (36.7 C) (Oral)  Ht 5\' 2"  (1.575 m)  Wt 153 lb (69.4 kg)  BMI 27.98 kg/m2  SpO2 99% Wt Readings from Last 3 Encounters:  04/20/13 153 lb (69.4 kg)  10/01/12 155 lb 6.4 oz (70.489 kg)  10/02/11 154 lb (69.854 kg)   Constitutional: She is overweight, but appears well-developed and well-nourished. No  distress.  HENT: Head: Normocephalic and atraumatic. Ears: B TMs ok, no erythema or effusion; Nose: Nose normal. Mouth/Throat: Oropharynx is clear and moist. No oropharyngeal exudate.  Eyes: Conjunctivae and EOM are normal. Pupils are equal, round, and reactive to light. No scleral icterus.  Neck: Normal range of motion. Neck supple. No JVD present. No thyromegaly present.  Cardiovascular: Normal rate, regular rhythm and normal heart sounds.  No murmur heard. No BLE edema. Pulmonary/Chest: Effort normal and breath sounds normal. No respiratory distress. She has no wheezes.    Abdominal: Soft. Bowel sounds are normal. She exhibits no distension. There is no tenderness. no masses Musculoskeletal: Normal range of motion, no joint effusions. No gross deformities Neurological: She is alert and oriented to person, place, and time. No cranial nerve deficit. Coordination, balance, strength, speech and gait are normal.  Skin: Skin is warm and dry. No rash noted. No erythema.  Psychiatric: She has a normal mood and affect. Her behavior is normal. Judgment and thought content normal.   Lab Results  Component Value Date   WBC 3.1 05/03/2012   HGB 11.4* 05/03/2012   HCT 35* 05/03/2012   PLT 272 05/03/2012   GLUCOSE 103* 09/06/2010   CHOL 245* 05/03/2012   TRIG 111 05/03/2012   HDL 80* 05/03/2012   LDLCALC 143 05/03/2012   ALT 12 05/03/2012   AST 16 05/03/2012   NA 141 05/03/2012   K 4.0 05/03/2012   CL 104 09/06/2010   CREATININE 0.9 05/03/2012   BUN 28* 05/03/2012   CO2 31 09/06/2010   TSH 0.07* 05/03/2012   INR 1.0 09/06/2010   HGBA1C 5.8 05/03/2012       Assessment & Plan:   CPX/v70.0 - Patient has been counseled on age-appropriate routine health concerns for screening and prevention. These are reviewed and up-to-date. Immunizations are up-to-date or declined. Labs ordered and reviewed.  Hyperglycemia - hx "borderline DM" - check a1c q6-83mo, The patient is asked to make an attempt to improve diet and exercise patterns to aid in medical management of this problem.   Also See problem list. Medications and labs reviewed today.

## 2013-08-26 ENCOUNTER — Telehealth: Payer: Self-pay | Admitting: Internal Medicine

## 2013-08-26 ENCOUNTER — Ambulatory Visit (INDEPENDENT_AMBULATORY_CARE_PROVIDER_SITE_OTHER): Payer: BC Managed Care – PPO | Admitting: *Deleted

## 2013-08-26 ENCOUNTER — Encounter: Payer: Self-pay | Admitting: Internal Medicine

## 2013-08-26 DIAGNOSIS — R55 Syncope and collapse: Secondary | ICD-10-CM

## 2013-08-26 DIAGNOSIS — Z950 Presence of cardiac pacemaker: Secondary | ICD-10-CM

## 2013-08-26 LAB — MDC_IDC_ENUM_SESS_TYPE_INCLINIC
Battery Remaining Percentage: 80 %
Brady Statistic RA Percent Paced: 59 %
Brady Statistic RV Percent Paced: 2 %
Implantable Pulse Generator Model: 359524
Implantable Pulse Generator Serial Number: 66089338
Lead Channel Impedance Value: 351 Ohm
Lead Channel Pacing Threshold Amplitude: 0.7 V
Lead Channel Pacing Threshold Amplitude: 0.7 V
Lead Channel Pacing Threshold Pulse Width: 0.4 ms
Lead Channel Pacing Threshold Pulse Width: 0.4 ms
Lead Channel Pacing Threshold Pulse Width: 0.4 ms
Lead Channel Sensing Intrinsic Amplitude: 5.6 mV
Lead Channel Setting Pacing Amplitude: 2 V
MDC IDC MSMT LEADCHNL RV IMPEDANCE VALUE: 429 Ohm
MDC IDC MSMT LEADCHNL RV PACING THRESHOLD AMPLITUDE: 0.9 V
MDC IDC MSMT LEADCHNL RV PACING THRESHOLD AMPLITUDE: 0.9 V
MDC IDC MSMT LEADCHNL RV PACING THRESHOLD PULSEWIDTH: 0.4 ms
MDC IDC MSMT LEADCHNL RV SENSING INTR AMPL: 15.7 mV
MDC IDC MSMT LEADCHNL RV SENSING INTR AMPL: 5.6 mV
MDC IDC SESS DTM: 20150522152823
MDC IDC SET LEADCHNL RV PACING AMPLITUDE: 2.4 V
MDC IDC SET LEADCHNL RV PACING PULSEWIDTH: 0.4 ms

## 2013-08-26 NOTE — Progress Notes (Signed)
Pacemaker check in clinic. Normal device function. Thresholds, sensing, impedances consistent with previous measurements. Device programmed to maximize longevity. 9 mode switches---longest 13min32sec, max-A 155bpm. 2 NSVT---longest 15 beats, max-V 208bpm. Device programmed at appropriate safety margins. Histogram distribution appropriate for patient activity level. Device programmed to optimize intrinsic conduction. Estimated longevity 13yrs 69mo. ROV w/ Dr. Caryl Comes 08/30/13 to discuss intermittent c/p.

## 2013-08-26 NOTE — Telephone Encounter (Signed)
Pt states two days ago she started to have chest pain with activity  and dizziness, and increased heart rate. the first time the chest  pain lasted 5 minutes the second one lasted 35 minutes to an hour. The chest pain is something new. Pt states today she is not having chest pain; she is fatigued. Pt would like to know if she can wait til June 30 th when she have her pacemake check or she needs to be check sooner. I spoke with charlie at device clinic; he states  will call pt this am. Pt is aware.

## 2013-08-26 NOTE — Telephone Encounter (Signed)
New message    Patient calling C/o symptoms in last couples of day -dizziness, chest pian, fatigue,    No chest pain at this moment.

## 2013-08-26 NOTE — Telephone Encounter (Signed)
Pt states pain is not awful but noticeable. I made appt for device check today at 2:30. Pt understands device will check heart rhythm only and will not be able to assess potential "plumbing" issues.

## 2013-08-30 ENCOUNTER — Encounter: Payer: Self-pay | Admitting: Internal Medicine

## 2013-08-30 ENCOUNTER — Ambulatory Visit (INDEPENDENT_AMBULATORY_CARE_PROVIDER_SITE_OTHER): Payer: BC Managed Care – PPO | Admitting: Internal Medicine

## 2013-08-30 VITALS — BP 130/63 | HR 76 | Ht 62.0 in | Wt 154.0 lb

## 2013-08-30 DIAGNOSIS — R55 Syncope and collapse: Secondary | ICD-10-CM

## 2013-08-30 DIAGNOSIS — R079 Chest pain, unspecified: Secondary | ICD-10-CM

## 2013-08-30 DIAGNOSIS — Z950 Presence of cardiac pacemaker: Secondary | ICD-10-CM

## 2013-08-30 NOTE — Progress Notes (Signed)
Patient Care Team: Rowe Clack, MD as PCP - General (Internal Medicine) Deboraha Sprang, MD (Cardiology) Jacelyn Pi, MD (Endocrinology) Juanita Craver, MD (Gastroenterology)   HPI  Cindy [name] is a 68 y.o. female  Seen in followup for syncope She has a remote history of vasovagal syncope, including following pain, phlebotomy, and IUD insertion.  Because of further episodes that occurred while eating and sitting she underwent loop recorder implantation which subsequently demonstrated a syncopal episode with a very long pause. Based on the issue-3 trial she underwent pacemaker implantation receiving a Biotronik CLS device.  She has had one episode of recurrent syncope.   She has intercurrently been diagnosed with Graves' disease is undergone radioactive ablation  She has had recent complaints of chest pain. 2 of these have been midsternal without radiation. They've been accompanied by a sense of fatigue. On lasted 3- 5 minutes minutes the other lasted  45 minutes.   Past Medical History  Diagnosis Date  . Arthritis   . Pacemaker -Biotronik-CLS 08/20/2010  . Keloid 12/31/2010    incisional   . HYPERTENSION   . GRAVES' DISEASE 2013    I-131 ablation, now post tx hypothyroid state  . GERD   . Impaired glucose tolerance test   . Unspecified vitamin D deficiency   . Sciatica   . Anemia     hx iron def, no GI loss indentifed - resolved with diet change  . Syncope 08/20/2010    s/p PPM 08/2010 for sxc pause on loop recorder causing same  . Postablative hypothyroidism     Qualifier: Diagnosis of  By: Joya Gaskins CMA, Irving Copas      Past Surgical History  Procedure Laterality Date  . Pacemaker insertion    . Appendectomy    . Bunionectomy Left   . Breast biopsy Left 2001    guided excisional bx (L) breast microcalcification at 12 o'clock- Benign  . Right ankle  06/2010    Trimalleolar fracture  . Cataract extraction  2014    Current Outpatient Prescriptions    Medication Sig Dispense Refill  . aspirin 81 MG tablet Take 81 mg by mouth daily.        Marland Kitchen b complex vitamins tablet Take 1 tablet by mouth daily.      . cholecalciferol (VITAMIN D) 1000 UNITS tablet Take 1,000 Units by mouth daily.        . diazepam (VALIUM) 5 MG tablet Take 1 tablet (5 mg total) by mouth as needed for muscle spasms (Back pain).  30 tablet  0  . levothyroxine (SYNTHROID, LEVOTHROID) 100 MCG tablet Take 100 mcg by mouth daily before breakfast.      . lisinopril (PRINIVIL,ZESTRIL) 20 MG tablet Take 1 tablet (20 mg total) by mouth daily.  90 tablet  3  . loratadine (CLARITIN) 10 MG tablet Take 10 mg by mouth as needed.       . magnesium oxide (MAG-OX) 400 MG tablet Take 500 mg by mouth daily.       . naproxen (NAPROSYN) 250 MG tablet Take 250 mg by mouth as needed.        . triamterene-hydrochlorothiazide (DYAZIDE) 37.5-25 MG per capsule Take 1 each (1 capsule total) by mouth every morning.  90 capsule  3  . vitamin E (VITAMIN E) 1000 UNIT capsule Take 1,000 Units by mouth daily.       No current facility-administered medications for this visit.    Allergies  Allergen Reactions  .  Codeine     Review of Systems negative except from HPI and PMH  Physical Exam BP 130/63  Pulse 76  Ht 5\' 2"  (1.575 m)  Wt 154 lb (69.854 kg)  BMI 28.16 kg/m2 Well developed and nourished in no acute distress HENT normal Neck supple with JVP-flat Clear Regular rate and rhythm, no murmurs or gallops Abd-soft with active BS No Clubbing cyanosis edema Skin-warm and dry A & Oriented  Grossly normal sensory and motor function  ECG demonstrates atrial pacing at 76 Interval 17/07/37   Assessment and  Plan  Syncope  Pacemaker-Biotronik  The patient's device was interrogated.  The information was reviewed. No changes were made in the programming.    Ventricular tachycardia  Atrial tachycardia  Elevated blood pressure  Chest pain-typical/atypical  Her chest pain is concerning.  ECG is unrevealing pacemaker interrogation demonstrated short runs of nonsustained atrial tachycardia; however they are not temporally related to her chest pain syndromes  Her VT is concerning also as a reflection of possible myocardial ischemia and/or injury. Hence we'll undertake a Myoview scan.

## 2013-08-30 NOTE — Patient Instructions (Signed)
Your physician recommends that you continue on your current medications as directed. Please refer to the Current Medication list given to you today.  Your physician has requested that you have a lexiscan myoview. For further information please visit [email or web address]. Please follow instruction sheet, as given.  Your physician wants you to follow-up in: 1 year with Dr. Caryl Comes. . You will receive a reminder letter in the mail two months in advance. If you don't receive a letter, please call our office to schedule the follow-up appointment.

## 2013-09-13 ENCOUNTER — Encounter (HOSPITAL_COMMUNITY): Payer: Self-pay

## 2013-09-16 ENCOUNTER — Other Ambulatory Visit: Payer: Self-pay | Admitting: Internal Medicine

## 2013-09-16 NOTE — Telephone Encounter (Signed)
F[facility]ed [facility] to [facility].../lmb 

## 2013-11-14 ENCOUNTER — Ambulatory Visit (HOSPITAL_COMMUNITY): Payer: BC Managed Care – PPO | Attending: Cardiology | Admitting: Radiology

## 2013-11-14 VITALS — BP 161/79 | HR 62 | Ht 62.0 in | Wt 153.0 lb

## 2013-11-14 DIAGNOSIS — Z87891 Personal history of nicotine dependence: Secondary | ICD-10-CM | POA: Insufficient documentation

## 2013-11-14 DIAGNOSIS — R55 Syncope and collapse: Secondary | ICD-10-CM | POA: Insufficient documentation

## 2013-11-14 DIAGNOSIS — I10 Essential (primary) hypertension: Secondary | ICD-10-CM | POA: Insufficient documentation

## 2013-11-14 DIAGNOSIS — R079 Chest pain, unspecified: Secondary | ICD-10-CM

## 2013-11-14 MED ORDER — TECHNETIUM TC 99M SESTAMIBI GENERIC - CARDIOLITE
30.0000 | Freq: Once | INTRAVENOUS | Status: AC | PRN
  Administered 2013-11-14: 30 via INTRAVENOUS

## 2013-11-14 MED ORDER — REGADENOSON 0.4 MG/5ML IV SOLN
0.4000 mg | Freq: Once | INTRAVENOUS | Status: AC
  Administered 2013-11-14: 0.4 mg via INTRAVENOUS

## 2013-11-14 MED ORDER — TECHNETIUM TC 99M SESTAMIBI GENERIC - CARDIOLITE
10.0000 | Freq: Once | INTRAVENOUS | Status: AC | PRN
  Administered 2013-11-14: 10 via INTRAVENOUS

## 2013-11-14 NOTE — Progress Notes (Signed)
[facility] [address] [place], [place] [address] [phone]    Cardiology Nuclear Med Study  [name] is a 68 y.o. female     MRN : [id number]     DOB: Dec 24, 1945  Procedure Date: 11/14/2013  Nuclear Med Background Indication for Stress Test:  Evaluation for Ischemia, and Patient seen in ED on 10-22-2013 for Chest pain, Enzymes negative in [place], [place] History:  No known CAD, PTVP (syncope), Echo 2011 EF 60-65% Cardiac Risk Factors: History of Smoking and Hypertension  Symptoms:  Chest Pain and Syncope   Nuclear Pre-Procedure Caffeine/Decaff Intake:  None > 12 hrs NPO After: 6:00pm   Lungs:  clear O2 Sat: 99% on room air. IV 0.9% NS with Angio Cath:  22g  IV Site: R Antecubital x 1, tolerated well IV Started by:  Irven Baltimore, RN  Chest Size (in):  38 Cup Size: DD  Height: 5\' 2"  (1.575 m)  Weight:  153 lb (69.4 kg)  BMI:  Body mass index is 27.98 kg/(m^2). Tech Comments:  N/A    Nuclear Med Study 1 or 2 day study: 1 day  Stress Test Type:  Treadmill/Lexiscan  Reading MD: N/A  Order Authorizing Provider:  Virl Axe, MD  Resting Radionuclide: Technetium 87m Sestamibi  Resting Radionuclide Dose: 11.0 mCi   Stress Radionuclide:  Technetium 41m Sestamibi  Stress Radionuclide Dose: 33.0 mCi           Stress Protocol Rest HR: 62 Stress HR: 111  Rest BP: 161/79 Stress BP: 88/70  Exercise Time (min): n/a METS: n/a           Dose of Adenosine (mg):  n/a Dose of Lexiscan: 0.4 mg  Dose of Atropine (mg): n/a Dose of Dobutamine: n/a mcg/kg/min (at max HR)  Stress Test Technologist: Glade Lloyd, BS-ES  Nuclear Technologist:  Vedia Pereyra, CNMT     Rest Procedure:  Myocardial perfusion imaging was performed at rest 45 minutes following the intravenous administration of Technetium 65m Sestamibi. Rest ECG: NSR - Normal EKG  Stress Procedure:  The patient received IV Lexiscan 0.4 mg over 15-seconds with  concurrent low level exercise and then Technetium 10m Sestamibi was injected at 30-seconds while the patient continued walking one more minute.  Quantitative spect images were obtained after a 45-minute delay.  During the infusion of Lexiscan the patient complained of head pressure that began to resolve in recovery.  Stress ECG: No significant ST segment change suggestive of ischemia.  QPS Raw Data Images:  Acquisition technically good; normal left ventricular size. Stress Images:  There is decreased uptake in the anterior wall. Rest Images:  Normal homogeneous uptake in all areas of the myocardium. Subtraction (SDS):  These findings are consistent with ischemia. Transient Ischemic Dilatation (Normal <1.22):  1.06 Lung/Heart Ratio (Normal <0.45):  0.27  Quantitative Gated Spect Images QGS EDV:  62 ml QGS ESV:  17 ml  Impression Exercise Capacity:  Lexiscan with low level exercise. BP Response:  Normal blood pressure response. Clinical Symptoms:  No chest pain or dyspnea. ECG Impression:  No significant ST segment change suggestive of ischemia. Comparison with Prior Nuclear Study: No previous nuclear study performed  Overall Impression:  Intermediate risk stress nuclear study with a moderate size, medium intensity, reversible anterior defect consistent with mild to moderate anterior ischemia.  LV Ejection Fraction: 73%.  LV Wall Motion:  NL LV Function; NL Wall Motion  Cindy [name]

## 2013-12-06 ENCOUNTER — Telehealth: Payer: Self-pay | Admitting: Internal Medicine

## 2013-12-06 NOTE — Telephone Encounter (Signed)
°  Patient is returning Dr Olin Pia call. Please call back.

## 2013-12-08 NOTE — Telephone Encounter (Signed)
Dr. Caryl Comes called and spoke with patient. Patient will be coming for OV 9/10 at 8:15 a.m

## 2013-12-15 ENCOUNTER — Ambulatory Visit (INDEPENDENT_AMBULATORY_CARE_PROVIDER_SITE_OTHER): Payer: Medicare Other | Admitting: Internal Medicine

## 2013-12-15 ENCOUNTER — Encounter: Payer: Self-pay | Admitting: Internal Medicine

## 2013-12-15 VITALS — BP 124/80 | HR 72 | Ht 62.0 in | Wt 157.0 lb

## 2013-12-15 DIAGNOSIS — R079 Chest pain, unspecified: Secondary | ICD-10-CM

## 2013-12-15 DIAGNOSIS — I498 Other specified cardiac arrhythmias: Secondary | ICD-10-CM

## 2013-12-15 DIAGNOSIS — Z01812 Encounter for preprocedural laboratory examination: Secondary | ICD-10-CM

## 2013-12-15 DIAGNOSIS — R9439 Abnormal result of other cardiovascular function study: Secondary | ICD-10-CM

## 2013-12-15 HISTORY — DX: Abnormal result of other cardiovascular function study: R94.39

## 2013-12-15 LAB — MDC_IDC_ENUM_SESS_TYPE_INCLINIC
Date Time Interrogation Session: 20150910113042
Implantable Pulse Generator Model: 359524
Implantable Pulse Generator Serial Number: 66089338
Lead Channel Impedance Value: 448 Ohm
Lead Channel Pacing Threshold Amplitude: 0.7 V
Lead Channel Pacing Threshold Amplitude: 0.7 V
Lead Channel Pacing Threshold Pulse Width: 0.4 ms
Lead Channel Pacing Threshold Pulse Width: 0.4 ms
Lead Channel Pacing Threshold Pulse Width: 0.4 ms
Lead Channel Sensing Intrinsic Amplitude: 14.1 mV
Lead Channel Sensing Intrinsic Amplitude: 5.9 mV
Lead Channel Sensing Intrinsic Amplitude: 5.9 mV
Lead Channel Setting Pacing Amplitude: 2 V
MDC IDC MSMT LEADCHNL RA IMPEDANCE VALUE: 351 Ohm
MDC IDC MSMT LEADCHNL RA PACING THRESHOLD PULSEWIDTH: 0.4 ms
MDC IDC MSMT LEADCHNL RV PACING THRESHOLD AMPLITUDE: 0.8 V
MDC IDC MSMT LEADCHNL RV PACING THRESHOLD AMPLITUDE: 0.8 V
MDC IDC SET LEADCHNL RV PACING AMPLITUDE: 2.4 V
MDC IDC SET LEADCHNL RV PACING PULSEWIDTH: 0.4 ms

## 2013-12-15 LAB — APTT: aPTT: 30.7 s (ref 23.4–32.7)

## 2013-12-15 LAB — CBC WITH DIFFERENTIAL/PLATELET
BASOS ABS: 0 10*3/uL (ref 0.0–0.1)
BASOS PCT: 1 % (ref 0.0–3.0)
EOS ABS: 0.1 10*3/uL (ref 0.0–0.7)
Eosinophils Relative: 1.8 % (ref 0.0–5.0)
HCT: 31.4 % — ABNORMAL LOW (ref 36.0–46.0)
Hemoglobin: 10.2 g/dL — ABNORMAL LOW (ref 12.0–15.0)
LYMPHS PCT: 37 % (ref 12.0–46.0)
Lymphs Abs: 1.5 10*3/uL (ref 0.7–4.0)
MCHC: 32.4 g/dL (ref 30.0–36.0)
MCV: 79 fl (ref 78.0–100.0)
Monocytes Absolute: 0.3 10*3/uL (ref 0.1–1.0)
Monocytes Relative: 6.7 % (ref 3.0–12.0)
Neutro Abs: 2.2 10*3/uL (ref 1.4–7.7)
Neutrophils Relative %: 53.5 % (ref 43.0–77.0)
PLATELETS: 320 10*3/uL (ref 150.0–400.0)
RBC: 3.97 Mil/uL (ref 3.87–5.11)
RDW: 20.4 % — AB (ref 11.5–15.5)
WBC: 4.1 10*3/uL (ref 4.0–10.5)

## 2013-12-15 LAB — BASIC METABOLIC PANEL
BUN: 26 mg/dL — ABNORMAL HIGH (ref 6–23)
CALCIUM: 9.6 mg/dL (ref 8.4–10.5)
CO2: 27 mEq/L (ref 19–32)
CREATININE: 1 mg/dL (ref 0.4–1.2)
Chloride: 103 mEq/L (ref 96–112)
GFR: 62.08 mL/min (ref >60.00)
Glucose, Bld: 85 mg/dL (ref 70–99)
Potassium: 3.9 mEq/L (ref 3.5–5.1)
Sodium: 138 mEq/L (ref 135–145)

## 2013-12-15 LAB — PROTIME-INR
INR: 1 ratio (ref 0.8–1.0)
Prothrombin Time: 11.1 s (ref 9.6–13.1)

## 2013-12-15 NOTE — Patient Instructions (Addendum)
Your physician recommends that you schedule a follow-up appointment in: 6 months with [facility] and in 12 months with Dr.Klein  Your physician has requested that you have a cardiac catheterization. Cardiac catheterization is used to diagnose and/or treat various heart conditions. Doctors may recommend this procedure for a number of different reasons. The most common reason is to evaluate chest pain. Chest pain can be a symptom of coronary artery disease (CAD), and cardiac catheterization can show whether plaque is narrowing or blocking your heart's arteries. This procedure is also used to evaluate the valves, as well as measure the blood flow and oxygen levels in different parts of your heart. For further information please visit [email or web address]. Please follow instruction sheet, as given.  Your physician recommends that you have lab work today (PTT, PT/INR, BMET, CBC).

## 2013-12-15 NOTE — Progress Notes (Signed)
Patient Care Team: Rowe Clack, MD as PCP - General (Internal Medicine) Deboraha Sprang, MD (Cardiology) Jacelyn Pi, MD (Endocrinology) Juanita Craver, MD (Gastroenterology)   HPI  Cindy [name] is a 68 y.o. female Seen in followup for a Myoview originally ordered in May for exertional chest discomfort and dyspnea. It was accomplished in August and showed a moderate risk scan with anterior ischemia. She comes in today to review the study.  Past Medical History  Diagnosis Date  . Arthritis   . Pacemaker -Biotronik-CLS 08/20/2010  . Keloid 12/31/2010    incisional   . HYPERTENSION   . GRAVES' DISEASE 2013    I-131 ablation, now post tx hypothyroid state  . GERD   . Impaired glucose tolerance test   . Unspecified vitamin D deficiency   . Sciatica   . Anemia     hx iron def, no GI loss indentifed - resolved with diet change  . Syncope 08/20/2010    s/p PPM 08/2010 for sxc pause on loop recorder causing same  . Postablative hypothyroidism     Qualifier: Diagnosis of  By: Joya Gaskins CMA, Irving Copas      Past Surgical History  Procedure Laterality Date  . Pacemaker insertion    . Appendectomy    . Bunionectomy Left   . Breast biopsy Left 2001    guided excisional bx (L) breast microcalcification at 12 o'clock- Benign  . Right ankle  06/2010    Trimalleolar fracture  . Cataract extraction  2014    Current Outpatient Prescriptions  Medication Sig Dispense Refill  . aspirin 81 MG tablet Take 81 mg by mouth daily.        Marland Kitchen b complex vitamins tablet Take 1 tablet by mouth daily.      . cholecalciferol (VITAMIN D) 1000 UNITS tablet Take 1,000 Units by mouth daily.        . diazepam (VALIUM) 5 MG tablet TAKE 1 TABLET AS NEEDED FOR MUSCLE SPASM--BACK PAIN.  30 tablet  5  . levothyroxine (SYNTHROID, LEVOTHROID) 100 MCG tablet Take 100 mcg by mouth daily before breakfast.      . lisinopril (PRINIVIL,ZESTRIL) 20 MG tablet Take 1 tablet (20 mg total) by mouth daily.  90 tablet   3  . loratadine (CLARITIN) 10 MG tablet Take 10 mg by mouth as needed.       . magnesium oxide (MAG-OX) 400 MG tablet Take 500 mg by mouth daily.       . naproxen (NAPROSYN) 250 MG tablet Take 250 mg by mouth as needed.        . triamterene-hydrochlorothiazide (DYAZIDE) 37.5-25 MG per capsule Take 1 each (1 capsule total) by mouth every morning.  90 capsule  3  . vitamin E (VITAMIN E) 1000 UNIT capsule Take 1,000 Units by mouth every other day.        No current facility-administered medications for this visit.    Allergies  Allergen Reactions  . Codeine     Review of Systems negative except from HPI and PMH  Physical Exam BP 124/80  Pulse 72  Ht 5\' 2"  (1.575 m)  Wt 157 lb (71.215 kg)  BMI 28.71 kg/m2 Well developed and well nourished in no acute distress HENT normal E scleral and icterus clear Neck Supple JVP flat; carotids brisk and full Clear to ausculation  Regular rate and rhythm, no murmurs gallops or rub Soft with active bowel sounds No clubbing cyanosis  Edema Alert and oriented,  grossly normal motor and sensory function Skin Warm and Dry    Assessment and  Plan  exertional chest discomfort  Abnormal nuclear study  Vasovagal syncope  Pacemaker - biotronik   have reviewed her study visually with the patient and with Dr. Tamala Julian. I introduced Dr. Tamala Julian the patient. We will plan to undertake catheterization to elucidate explanation for the abnormal Myoview scan;  risks were defined

## 2013-12-19 ENCOUNTER — Encounter (HOSPITAL_COMMUNITY): Payer: Self-pay | Admitting: Pharmacy Technician

## 2013-12-19 ENCOUNTER — Encounter: Payer: Self-pay | Admitting: Internal Medicine

## 2013-12-22 ENCOUNTER — Ambulatory Visit (HOSPITAL_COMMUNITY)
Admission: RE | Admit: 2013-12-22 | Discharge: 2013-12-22 | Disposition: A | Discharge status: Home / Self Care | Payer: Medicare Other | Source: Ambulatory Visit | Attending: Interventional Cardiology | Admitting: Interventional Cardiology

## 2013-12-22 ENCOUNTER — Encounter (HOSPITAL_COMMUNITY)
Admission: RE | Disposition: A | Discharge status: Home / Self Care | Payer: Self-pay | Source: Ambulatory Visit | Attending: Interventional Cardiology

## 2013-12-22 DIAGNOSIS — R9439 Abnormal result of other cardiovascular function study: Secondary | ICD-10-CM

## 2013-12-22 DIAGNOSIS — R0609 Other forms of dyspnea: Secondary | ICD-10-CM | POA: Insufficient documentation

## 2013-12-22 DIAGNOSIS — I10 Essential (primary) hypertension: Secondary | ICD-10-CM | POA: Diagnosis not present

## 2013-12-22 DIAGNOSIS — R079 Chest pain, unspecified: Secondary | ICD-10-CM | POA: Diagnosis present

## 2013-12-22 DIAGNOSIS — K219 Gastro-esophageal reflux disease without esophagitis: Secondary | ICD-10-CM | POA: Insufficient documentation

## 2013-12-22 DIAGNOSIS — Z7982 Long term (current) use of aspirin: Secondary | ICD-10-CM | POA: Insufficient documentation

## 2013-12-22 DIAGNOSIS — E039 Hypothyroidism, unspecified: Secondary | ICD-10-CM | POA: Diagnosis not present

## 2013-12-22 DIAGNOSIS — M129 Arthropathy, unspecified: Secondary | ICD-10-CM | POA: Insufficient documentation

## 2013-12-22 DIAGNOSIS — Z950 Presence of cardiac pacemaker: Secondary | ICD-10-CM | POA: Diagnosis not present

## 2013-12-22 DIAGNOSIS — I498 Other specified cardiac arrhythmias: Secondary | ICD-10-CM

## 2013-12-22 DIAGNOSIS — Z01812 Encounter for preprocedural laboratory examination: Secondary | ICD-10-CM

## 2013-12-22 DIAGNOSIS — R0989 Other specified symptoms and signs involving the circulatory and respiratory systems: Secondary | ICD-10-CM | POA: Diagnosis not present

## 2013-12-22 HISTORY — PX: LEFT HEART CATHETERIZATION WITH CORONARY ANGIOGRAM: SHX5451

## 2013-12-22 LAB — CK TOTAL AND CKMB (NOT AT ARMC)
CK, MB: 2.4 ng/mL (ref 0.3–4.0)
RELATIVE INDEX: 1.6 (ref 0.0–2.5)
Total CK: 146 U/L (ref 7–177)

## 2013-12-22 SURGERY — LEFT HEART CATHETERIZATION WITH CORONARY ANGIOGRAM
Anesthesia: LOCAL

## 2013-12-22 MED ORDER — LIDOCAINE HCL (PF) 1 % IJ SOLN
INTRAMUSCULAR | Status: AC
  Filled 2013-12-22: qty 30

## 2013-12-22 MED ORDER — ASPIRIN 81 MG PO CHEW: 81.0000 mg | CHEWABLE_TABLET | ORAL | Status: DC

## 2013-12-22 MED ORDER — HEPARIN (PORCINE) IN NACL 2-0.9 UNIT/ML-% IJ SOLN
INTRAMUSCULAR | Status: AC
  Filled 2013-12-22: qty 500

## 2013-12-22 MED ORDER — NITROGLYCERIN 1 MG/10 ML FOR IR/CATH LAB
INTRA_ARTERIAL | Status: AC
  Filled 2013-12-22: qty 10

## 2013-12-22 MED ORDER — MIDAZOLAM HCL 2 MG/2ML IJ SOLN
INTRAMUSCULAR | Status: AC
  Filled 2013-12-22: qty 2

## 2013-12-22 MED ORDER — FENTANYL CITRATE 0.05 MG/ML IJ SOLN
INTRAMUSCULAR | Status: AC
  Filled 2013-12-22: qty 2

## 2013-12-22 MED ORDER — SODIUM CHLORIDE 0.9 % IV SOLN: INTRAVENOUS | Status: DC

## 2013-12-22 MED ORDER — ONDANSETRON HCL 4 MG/2ML IJ SOLN: 4.0000 mg | Freq: Four times a day (QID) | INTRAMUSCULAR | Status: DC | PRN

## 2013-12-22 MED ORDER — HEPARIN (PORCINE) IN NACL 2-0.9 UNIT/ML-% IJ SOLN
INTRAMUSCULAR | Status: AC
  Filled 2013-12-22: qty 1000

## 2013-12-22 MED ORDER — SODIUM CHLORIDE 0.9 % IJ SOLN: 3.0000 mL | INTRAMUSCULAR | Status: DC | PRN

## 2013-12-22 MED ORDER — VERAPAMIL HCL 2.5 MG/ML IV SOLN
INTRAVENOUS | Status: AC
  Filled 2013-12-22: qty 2

## 2013-12-22 MED ORDER — SODIUM CHLORIDE 0.9 % IJ SOLN: 3.0000 mL | Freq: Two times a day (BID) | INTRAMUSCULAR | Status: DC

## 2013-12-22 MED ORDER — SODIUM CHLORIDE 0.9 % IV SOLN: 1.0000 mL/kg/h | INTRAVENOUS | Status: DC

## 2013-12-22 MED ORDER — ACETAMINOPHEN 325 MG PO TABS: 650.0000 mg | ORAL_TABLET | ORAL | Status: DC | PRN

## 2013-12-22 MED ORDER — SODIUM CHLORIDE 0.9 % IV SOLN: 250.0000 mL | INTRAVENOUS | Status: DC | PRN

## 2013-12-22 MED ORDER — HEPARIN SODIUM (PORCINE) 1000 UNIT/ML IJ SOLN
INTRAMUSCULAR | Status: AC
  Filled 2013-12-22: qty 1

## 2013-12-22 NOTE — Research (Signed)
Bioflow Informed Consent   Subject Name: Cindy [name]  Subject met inclusion and exclusion criteria.  The informed consent form, study requirements and expectations were reviewed with the subject and questions and concerns were addressed prior to the signing of the consent form.  The subject verbalized understanding of the trail requirements.  The subject agreed to participate in the Bioflow trial and signed the informed consent.  The informed consent was obtained prior to performance of any protocol-specific procedures for the subject.  A copy of the signed informed consent was given to the subject and a copy was placed in the subject's medical record.  [name] 12/22/2013, 7:45

## 2013-12-22 NOTE — Discharge Instructions (Signed)

## 2013-12-22 NOTE — CV Procedure (Signed)
     Left Heart Catheterization with Coronary Angiography  Report  Cindy [name]  68 y.o.  female 1945-09-15  Procedure Date: 12/22/2013 Referring Physician: Jolyn Nap, M.D. Primary Cardiologist:: Jolyn Nap, M.D.  INDICATIONS: Anterior ischemia on myocardial perfusion imaging  PROCEDURE: 1. Left heart catheterization; 2. Coronary angiography; 3. Left ventriculography  CONSENT:  The risks, benefits, and details of the procedure were explained in detail to the patient. Risks including death, stroke, heart attack, kidney injury, allergy, limb ischemia, bleeding and radiation injury were discussed.  The patient verbalized understanding and wanted to proceed.  Informed written consent was obtained.  PROCEDURE TECHNIQUE:  After Xylocaine anesthesia a 5 French sheath was placed in the right femoral artery using the modified Seldinger technique.  The femoral approach was used after multiple attempts to gain access in the right radial were unsuccessful. We will able to get the artery twice but cannot feed the guidewire. Coronary angiography was done using a 5 F JR 4 and JL 3.5 cm I denies catheters.  Left ventriculography was done using the JR 4  catheter and hand injection.   Digital images were reviewed and were felt to represent normal anatomy without evidence of obstruction.   CONTRAST:  Total of 60 cc.  COMPLICATIONS:  None other than multiple sticks in the right radial region.   HEMODYNAMICS:  Aortic pressure 104/55 mmHg; LV pressure one oh eight over three; LVEDP 8 mm mercury  ANGIOGRAPHIC DATA:   The left main coronary artery is normal.  The left anterior descending artery is normal.  The left circumflex artery is normal.  The right coronary artery is normal and dominant.   LEFT VENTRICULOGRAM:  Left ventricular angiogram was done in the 30 RAO projection and revealed normal with EF 65%   IMPRESSIONS:  1. Normal coronary arteries 2. Normal left ventricular function  with normal hemodynamics 3. Abnormal myocardial perfusion study represents false positive imaging with abnormalities seen likely representing breast attenuation artifact.   RECOMMENDATION:  Discharge later today if no bleeding or other complication.Marland Kitchen

## 2013-12-22 NOTE — H&P (View-Only) (Signed)
Patient Care Team: Rowe Clack, MD as PCP - General (Internal Medicine) Deboraha Sprang, MD (Cardiology) Jacelyn Pi, MD (Endocrinology) Juanita Craver, MD (Gastroenterology)   HPI  Cindy HOLSWORTH is a 68 y.o. female Seen in followup for a Myoview originally ordered in May for exertional chest discomfort and dyspnea. It was accomplished in August and showed a moderate risk scan with anterior ischemia. She comes in today to review the study.  Past Medical History  Diagnosis Date  . Arthritis   . Pacemaker -Biotronik-CLS 08/20/2010  . Keloid 12/31/2010    incisional   . HYPERTENSION   . GRAVES' DISEASE 2013    I-131 ablation, now post tx hypothyroid state  . GERD   . Impaired glucose tolerance test   . Unspecified vitamin D deficiency   . Sciatica   . Anemia     hx iron def, no GI loss indentifed - resolved with diet change  . Syncope 08/20/2010    s/p PPM 08/2010 for sxc pause on loop recorder causing same  . Postablative hypothyroidism     Qualifier: Diagnosis of  By: Joya Gaskins CMA, Irving Copas      Past Surgical History  Procedure Laterality Date  . Pacemaker insertion    . Appendectomy    . Bunionectomy Left   . Breast biopsy Left 2001    guided excisional bx (L) breast microcalcification at 12 o'clock- Benign  . Right ankle  06/2010    Trimalleolar fracture  . Cataract extraction  2014    Current Outpatient Prescriptions  Medication Sig Dispense Refill  . aspirin 81 MG tablet Take 81 mg by mouth daily.        Marland Kitchen b complex vitamins tablet Take 1 tablet by mouth daily.      . cholecalciferol (VITAMIN D) 1000 UNITS tablet Take 1,000 Units by mouth daily.        . diazepam (VALIUM) 5 MG tablet TAKE 1 TABLET AS NEEDED FOR MUSCLE SPASM--BACK PAIN.  30 tablet  5  . levothyroxine (SYNTHROID, LEVOTHROID) 100 MCG tablet Take 100 mcg by mouth daily before breakfast.      . lisinopril (PRINIVIL,ZESTRIL) 20 MG tablet Take 1 tablet (20 mg total) by mouth daily.  90 tablet   3  . loratadine (CLARITIN) 10 MG tablet Take 10 mg by mouth as needed.       . magnesium oxide (MAG-OX) 400 MG tablet Take 500 mg by mouth daily.       . naproxen (NAPROSYN) 250 MG tablet Take 250 mg by mouth as needed.        . triamterene-hydrochlorothiazide (DYAZIDE) 37.5-25 MG per capsule Take 1 each (1 capsule total) by mouth every morning.  90 capsule  3  . vitamin E (VITAMIN E) 1000 UNIT capsule Take 1,000 Units by mouth every other day.        No current facility-administered medications for this visit.    Allergies  Allergen Reactions  . Codeine     Review of Systems negative except from HPI and PMH  Physical Exam BP 124/80  Pulse 72  Ht 5\' 2"  (1.575 m)  Wt 157 lb (71.215 kg)  BMI 28.71 kg/m2 Well developed and well nourished in no acute distress HENT normal E scleral and icterus clear Neck Supple JVP flat; carotids brisk and full Clear to ausculation  Regular rate and rhythm, no murmurs gallops or rub Soft with active bowel sounds No clubbing cyanosis  Edema Alert and oriented,  grossly normal motor and sensory function Skin Warm and Dry    Assessment and  Plan  exertional chest discomfort  Abnormal nuclear study  Vasovagal syncope  Pacemaker - biotronik   have reviewed her study visually with the patient and with Dr. Tamala Julian. I introduced Dr. Tamala Julian the patient. We will plan to undertake catheterization to elucidate explanation for the abnormal Myoview scan;  risks were defined

## 2013-12-22 NOTE — Interval H&P Note (Signed)
Cath Lab Visit (complete for each Cath Lab visit)  Clinical Evaluation Leading to the Procedure:   ACS: No.  Non-ACS:    Anginal Classification: CCS III  Anti-ischemic medical therapy: Minimal Therapy (1 class of medications)  Non-Invasive Test Results: Intermediate-risk stress test findings: cardiac mortality 1-3%/year  Prior CABG: No previous CABG      History and Physical Interval Note:  12/22/2013 7:21 AM  Cindy [name]  has presented today for surgery, with the diagnosis of cp/adnormal stress test  The various methods of treatment have been discussed with the patient and family. After consideration of risks, benefits and other options for treatment, the patient has consented to  Procedure(s): LEFT HEART CATHETERIZATION WITH CORONARY ANGIOGRAM (N/A) as a surgical intervention .  The patient's history has been reviewed, patient examined, no change in status, stable for surgery.  I have reviewed the patient's chart and labs.  Questions were answered to the patient's satisfaction.     Sinclair Grooms

## 2013-12-22 NOTE — Progress Notes (Signed)
Per Dr Tamala Julian ok to ambulate after 2hrs bedrest

## 2014-01-03 ENCOUNTER — Encounter: Payer: Self-pay | Admitting: Family Medicine

## 2014-01-03 ENCOUNTER — Ambulatory Visit (INDEPENDENT_AMBULATORY_CARE_PROVIDER_SITE_OTHER): Payer: Medicare Other | Admitting: Family Medicine

## 2014-01-03 VITALS — BP 118/82 | HR 80 | Ht 64.0 in | Wt 156.0 lb

## 2014-01-03 DIAGNOSIS — IMO0002 Reserved for concepts with insufficient information to code with codable children: Secondary | ICD-10-CM

## 2014-01-03 DIAGNOSIS — M5416 Radiculopathy, lumbar region: Secondary | ICD-10-CM

## 2014-01-03 MED ORDER — PREDNISONE 50 MG PO TABS: 50.0000 mg | ORAL_TABLET | Freq: Every day | ORAL | Status: DC

## 2014-01-03 NOTE — Assessment & Plan Note (Signed)
Patient is having lumbar radiculopathy with positive straight leg test but no significant weakness. Patient has it in the L5 nerve root on the right side. Patient has a past medical history significant for herniated disc at this level previously greater than 20 years ago. Patient will try conservative therapy and was given prednisone, home exercises, icing protocol, and we discussed about proper lifting technique. Patient then is going to followup in one week. Continuing to have difficulty we will do a CT scan of the back secondary to patient having a pacemaker and is unable to have an MRI. Patient continues to have pain we'll also consider referral to formal physical therapy.

## 2014-01-03 NOTE — Progress Notes (Signed)
  [facility] [address] [place], [place] [address] Phone: [phone] Subjective:    I'm seeing this patient by the request  of: Gwendolyn [place], MD    CC: Low back pain  WLN:[id number] Cindy [name] is a 68 y.o. female coming in with complaint of back pain. Patient states that it seems to be worsening over the last several weeks. Patient does have a past medical history significant for a herniated disc that did respond well to an epidural. Patient states that this is significantly similar. Patient states that the pain is mostly in the back lumbar region on the right side and does have radicular symptoms that go down her posterior aspect and in the lateral aspect of her leg usually stopping around her knee. Patient denies any weakness, denies any bowel or bladder incontinence. Patient states that she still able to do most of her activity but has been walking significantly slower. Patient usually is very active and would like to be able to do her yoga but secondary to the pain she has not been able to. Patient has been taking Valium as a muscle relaxer and some over-the-counter medicines with mild to moderate benefit. Patient states that she has been resting comfortably. Patient was the severity of 6/10.     Past medical history, social, surgical and family history all reviewed in electronic medical record.   Review of Systems: No headache, visual changes, nausea, vomiting, diarrhea, constipation, dizziness, abdominal pain, skin rash, fevers, chills, night sweats, weight loss, swollen lymph nodes, body aches, joint swelling, muscle aches, chest pain, shortness of breath, mood changes.   Objective Blood pressure 118/82, pulse 80, height 5\' 4"  (1.626 m), weight 156 lb (70.761 kg), SpO2 96.00%.  General: No apparent distress alert and oriented x3 mood and affect normal, dressed appropriately.  HEENT: Pupils equal, extraocular movements intact  Respiratory:  Patient's speak in full sentences and does not appear short of breath  Cardiovascular: No lower extremity edema, non tender, no erythema  Skin: Warm dry intact with no signs of infection or rash on extremities or on axial skeleton.  Abdomen: Soft nontender  Neuro: Cranial nerves II through XII are intact, neurovascularly intact in all extremities with 2+ DTRs and 2+ pulses.  Lymph: No lymphadenopathy of posterior or anterior cervical chain or axillae bilaterally.  Gait normal with good balance and coordination.  MSK:  Non tender with full range of motion and good stability and symmetric strength and tone of shoulders, elbows, wrist, hip, knee and ankles bilaterally.  Back Exam:  Inspection: Unremarkable  Motion: Flexion 35 deg, Extension 25 deg, Side Bending to 35 deg bilaterally,  Rotation to 35 deg bilaterally  SLR laying: Positive right XSLR laying: Negative  Palpable tenderness: Moderate tenderness over the right-sided paraspinal musculature especially at L5. FABER: negative. Sensory change: Gross sensation intact to all lumbar and sacral dermatomes.  Reflexes: 2+ at both patellar tendons, 2+ at achilles tendons, Babinski's downgoing.  Strength at foot  Plantar-flexion: 5/5 Dorsi-flexion: 5/5 Eversion: 5/5 Inversion: 4/5  Leg strength  Quad: 5/5 Hamstring: 5/5 Hip flexor: 5/5 Hip abductors: 4/5  Gait unremarkable.    Impression and Recommendations:     This case required medical decision making of moderate complexity.

## 2014-01-03 NOTE — Patient Instructions (Signed)
Very nice to meet you Ice 20 minutes 2 times daily. Usually after activity and before bed. Exercises 3 times a week.  Prednisone for five days.  Take tylenol 650 mg three times a day is the best evidence based medicine we have for arthritis.  Glucosamine sulfate 750mg  twice a day is a supplement that has been shown to help moderate to severe arthritis. Vitamin D 2000 IU daily Fish oil 2 grams daily.  Tumeric 500mg  twice daily.  Capsaicin topically up to four times a day may also help with pain. It's important that you continue to stay active. Consider physical therapy to strengthen muscles around the joint that hurts to take pressure off of the joint itself. Continue the Yoga Call me in 1 week and if not better we will get CT scan of your back.

## 2014-01-05 ENCOUNTER — Ambulatory Visit (INDEPENDENT_AMBULATORY_CARE_PROVIDER_SITE_OTHER)
Admission: RE | Admit: 2014-01-05 | Discharge: 2014-01-05 | Disposition: A | Discharge status: Home / Self Care | Payer: Medicare Other | Source: Ambulatory Visit | Attending: Family Medicine | Admitting: Family Medicine

## 2014-01-05 DIAGNOSIS — M5416 Radiculopathy, lumbar region: Secondary | ICD-10-CM

## 2014-01-10 ENCOUNTER — Encounter: Payer: Self-pay | Admitting: Family Medicine

## 2014-03-16 ENCOUNTER — Encounter (HOSPITAL_COMMUNITY): Payer: Self-pay | Admitting: Interventional Cardiology

## 2014-04-21 ENCOUNTER — Encounter: Payer: Self-pay | Admitting: Internal Medicine

## 2014-04-21 ENCOUNTER — Ambulatory Visit (INDEPENDENT_AMBULATORY_CARE_PROVIDER_SITE_OTHER): Payer: Medicare Other | Admitting: Internal Medicine

## 2014-04-21 ENCOUNTER — Other Ambulatory Visit (INDEPENDENT_AMBULATORY_CARE_PROVIDER_SITE_OTHER): Payer: Medicare Other

## 2014-04-21 ENCOUNTER — Other Ambulatory Visit: Payer: Self-pay | Admitting: Internal Medicine

## 2014-04-21 VITALS — BP 148/80 | HR 87 | Temp 97.5°F | Resp 18 | Ht 62.0 in | Wt 156.1 lb

## 2014-04-21 DIAGNOSIS — Z418 Encounter for other procedures for purposes other than remedying health state: Secondary | ICD-10-CM

## 2014-04-21 DIAGNOSIS — R5382 Chronic fatigue, unspecified: Secondary | ICD-10-CM

## 2014-04-21 DIAGNOSIS — R638 Other symptoms and signs concerning food and fluid intake: Secondary | ICD-10-CM

## 2014-04-21 DIAGNOSIS — E039 Hypothyroidism, unspecified: Secondary | ICD-10-CM

## 2014-04-21 DIAGNOSIS — Z299 Encounter for prophylactic measures, unspecified: Secondary | ICD-10-CM

## 2014-04-21 DIAGNOSIS — Z23 Encounter for immunization: Secondary | ICD-10-CM

## 2014-04-21 LAB — COMPREHENSIVE METABOLIC PANEL
ALT: 14 U/L (ref 0–35)
AST: 16 U/L (ref 0–37)
Albumin: 4.3 g/dL (ref 3.5–5.2)
Alkaline Phosphatase: 50 U/L (ref 39–117)
BILIRUBIN TOTAL: 0.7 mg/dL (ref 0.2–1.2)
BUN: 25 mg/dL — AB (ref 6–23)
CO2: 29 mEq/L (ref 19–32)
Calcium: 9.7 mg/dL (ref 8.4–10.5)
Chloride: 101 mEq/L (ref 96–112)
Creatinine, Ser: 0.99 mg/dL (ref 0.40–1.20)
GFR: 59.14 mL/min — ABNORMAL LOW (ref >60.00)
Glucose, Bld: 106 mg/dL — ABNORMAL HIGH (ref 70–99)
Potassium: 3.9 mEq/L (ref 3.5–5.1)
Sodium: 138 mEq/L (ref 135–145)
Total Protein: 8 g/dL (ref 6.0–8.3)

## 2014-04-21 LAB — CBC
HEMATOCRIT: 34 % — AB (ref 36.0–46.0)
Hemoglobin: 10.9 g/dL — ABNORMAL LOW (ref 12.0–15.0)
MCHC: 32 g/dL (ref 30.0–36.0)
MCV: 78.8 fl (ref 78.0–100.0)
Platelets: 338 10*3/uL (ref 150.0–400.0)
RBC: 4.31 Mil/uL (ref 3.87–5.11)
RDW: 17.3 % — ABNORMAL HIGH (ref 11.5–15.5)
WBC: 3.8 10*3/uL — ABNORMAL LOW (ref 4.0–10.5)

## 2014-04-21 LAB — LIPID PANEL
CHOLESTEROL: 230 mg/dL — AB (ref 0–200)
HDL: 74.3 mg/dL (ref >39.00)
LDL CALC: 126 mg/dL — AB (ref 0–99)
NonHDL: 155.7
TRIGLYCERIDES: 150 mg/dL — AB (ref 0.0–149.0)
Total CHOL/HDL Ratio: 3
VLDL: 30 mg/dL (ref 0.0–40.0)

## 2014-04-21 LAB — T4, FREE: FREE T4: 1.22 ng/dL (ref 0.60–1.60)

## 2014-04-21 LAB — HEMOGLOBIN A1C: HEMOGLOBIN A1C: 6.1 % (ref 4.6–6.5)

## 2014-04-21 LAB — TSH: TSH: 3.75 u[IU]/mL (ref 0.35–4.50)

## 2014-04-21 MED ORDER — DIAZEPAM 5 MG PO TABS: 5.0000 mg | ORAL_TABLET | Freq: Every day | ORAL | Status: DC | PRN

## 2014-04-21 MED ORDER — TRIAMTERENE-HCTZ 37.5-25 MG PO CAPS: 1.0000 | ORAL_CAPSULE | Freq: Every day | ORAL | Status: DC

## 2014-04-21 MED ORDER — LISINOPRIL 20 MG PO TABS: 20.0000 mg | ORAL_TABLET | Freq: Every day | ORAL | Status: DC

## 2014-04-21 NOTE — Patient Instructions (Signed)
We will check your blood work today to make sure that we are not missing any problems and that the thyroid is at the right level.  If we are not successful with increasing exercise to help lose weight there are a couple of medical option for helping.   One is called phentermine which helps to rev up the body's metabolism and suppress appetite to help you lose weight. This is a medicine that is only safe to take up about 3-6 months. If we start this medicine we would want to see you back about 2-3 months after starting to make sure you are doing well with it.   Another medicine is called belviq or lorcaserin which is a medicine that works on the hormones in the brain to decrease your appetite and it does not rev up the body. This makes it safe to take long term. It does not work for everyone and so we have you come back about 3 months after starting to see if it is working for you. This medicine is not generic and can be more expensive which is not always covered by insurance.   We will have you try increasing exercise for the next 2-3 months and if you are still not making any progress we could try one of these medicines.   Exercise to Lose Weight Exercise and a healthy diet may help you lose weight. Your doctor may suggest specific exercises. EXERCISE IDEAS AND TIPS  Choose low-cost things you enjoy doing, such as walking, bicycling, or exercising to workout videos.  Take stairs instead of the elevator.  Walk during your lunch break.  Park your car further away from work or school.  Go to a gym or an exercise class.  Start with 5 to 10 minutes of exercise each day. Build up to 30 minutes of exercise 4 to 6 days a week.  Wear shoes with good support and comfortable clothes.  Stretch before and after working out.  Work out until you breathe harder and your heart beats faster.  Drink extra water when you exercise.  Do not do so much that you hurt yourself, feel dizzy, or get very  short of breath. Exercises that burn about 150 calories:  Running 1  miles in 15 minutes.  Playing volleyball for 45 to 60 minutes.  Washing and waxing a car for 45 to 60 minutes.  Playing touch football for 45 minutes.  Walking 1  miles in 35 minutes.  Pushing a stroller 1  miles in 30 minutes.  Playing basketball for 30 minutes.  Raking leaves for 30 minutes.  Bicycling 5 miles in 30 minutes.  Walking 2 miles in 30 minutes.  Dancing for 30 minutes.  Shoveling snow for 15 minutes.  Swimming laps for 20 minutes.  Walking up stairs for 15 minutes.  Bicycling 4 miles in 15 minutes.  Gardening for 30 to 45 minutes.  Jumping rope for 15 minutes.  Washing windows or floors for 45 to 60 minutes. Document Released: 04/26/2010 Document Revised: 06/16/2011 Document Reviewed: 04/26/2010 [facility] Patient Information 2015 [place], [place]. This information is not intended to replace advice given to you by your health care provider. Make sure you discuss any questions you have with your health care provider.

## 2014-04-21 NOTE — Telephone Encounter (Signed)
Sent maintenance meds pls advise on refill for diazepam.../lmb

## 2014-04-21 NOTE — Telephone Encounter (Signed)
Patient requesting that these meds be renewed because she forgot to mention during visit. Thank you.   lisinopril (PRINIVIL,ZESTRIL) 20 MG tablet [[id number]]  triamterene-hydrochlorothiazide (DYAZIDE) 37.5-25 MG per capsule diazepam (VALIUM) 5 MG tablet [[id number]]

## 2014-04-21 NOTE — Telephone Encounter (Signed)
Faxed prescription to pharmacy for diazepam.

## 2014-04-21 NOTE — Progress Notes (Signed)
   Subjective:    Patient ID: Cindy [name], female    DOB: 01-25-1946, 69 y.o.   MRN: [id number]  HPI The patient is here for annual wellness exam. She has PMH of HTN, hypothyroidism, lumbar pain, GERD, pacemaker. She is also having some troubles with inability to lose weight. She has been trying to eat a little better. Exercising more has been a struggle with her pains. She thinks that she has a rare genetic disorder called dercum's disease which has to do with painful lipomas. She does have multiple lipomas and they are painful. She has not really tried much for them at this time. She also does not have as much energy although she isn't sure if this is related to her thyroid. She does see an endocrinologist. She denies other new complaints.   Review of Systems  Constitutional: Negative for chills, activity change, appetite change, fatigue and unexpected weight change.  HENT: Negative.   Respiratory: Negative for cough, chest tightness, shortness of breath and wheezing.   Cardiovascular: Negative for chest pain, palpitations and leg swelling.  Gastrointestinal: Negative for abdominal pain, diarrhea, constipation and abdominal distention.  Musculoskeletal: Positive for myalgias, back pain and arthralgias.  Skin: Negative.   Neurological: Negative.   Psychiatric/Behavioral: Negative.       Objective:   Physical Exam  Constitutional: She is oriented to person, place, and time. She appears well-developed and well-nourished.  HENT:  Head: Normocephalic and atraumatic.  Eyes: EOM are normal.  Neck: Normal range of motion.  Cardiovascular: Normal rate and regular rhythm.   Pulmonary/Chest: Effort normal and breath sounds normal. No respiratory distress. She has no wheezes. She has no rales.  Abdominal: Soft. Bowel sounds are normal. She exhibits no distension. There is no tenderness. There is no rebound.  Musculoskeletal: She exhibits tenderness. She exhibits no edema.  Right leg with  soreness from the groin to below the knee.   Neurological: She is alert and oriented to person, place, and time. Coordination normal.  Skin: Skin is warm and dry.   Filed Vitals:   04/21/14 0842  BP: 148/80  Pulse: 87  Temp: 97.5 F (36.4 C)  TempSrc: Oral  Resp: 18  Height: 5\' 2"  (1.575 m)  Weight: 156 lb 1.9 oz (70.816 kg)  SpO2: 97%      Assessment & Plan:

## 2014-04-24 DIAGNOSIS — R638 Other symptoms and signs concerning food and fluid intake: Secondary | ICD-10-CM | POA: Insufficient documentation

## 2014-04-24 NOTE — Assessment & Plan Note (Signed)
Patient is doing better with diet, will check thyroid function as this can be related to weight gain and difficulty with weight loss. Will also check CMP, HgA1c, lipid panel.

## 2014-04-25 MED ORDER — METFORMIN HCL 500 MG PO TABS: 500.0000 mg | ORAL_TABLET | Freq: Two times a day (BID) | ORAL | Status: DC

## 2014-04-25 NOTE — Addendum Note (Signed)
Addended by: Vertell Novak A on: 04/25/2014 10:54 AM   Modules accepted: Orders

## 2014-05-16 LAB — HM MAMMOGRAPHY

## 2014-05-18 ENCOUNTER — Telehealth: Payer: Self-pay | Admitting: Internal Medicine

## 2014-05-18 NOTE — Telephone Encounter (Signed)
Needs an order signed before tomorrow.  Was originally trying to send to [facility].  Please give a call back in regards. Will refax order over.

## 2014-05-19 NOTE — Telephone Encounter (Signed)
Signed and faxed

## 2014-05-25 ENCOUNTER — Encounter: Payer: Self-pay | Admitting: Internal Medicine

## 2014-06-05 ENCOUNTER — Other Ambulatory Visit: Payer: Self-pay | Admitting: Internal Medicine

## 2014-06-08 ENCOUNTER — Encounter: Payer: Self-pay | Admitting: Internal Medicine

## 2014-06-14 ENCOUNTER — Ambulatory Visit (INDEPENDENT_AMBULATORY_CARE_PROVIDER_SITE_OTHER): Payer: Medicare Other | Admitting: *Deleted

## 2014-06-14 DIAGNOSIS — I442 Atrioventricular block, complete: Secondary | ICD-10-CM | POA: Diagnosis not present

## 2014-06-14 LAB — MDC_IDC_ENUM_SESS_TYPE_INCLINIC
Date Time Interrogation Session: 20160309102225
Implantable Pulse Generator Model: 359524
Implantable Pulse Generator Serial Number: 66089338
Lead Channel Impedance Value: 429 Ohm
Lead Channel Pacing Threshold Amplitude: 0.9 V
Lead Channel Pacing Threshold Pulse Width: 0.4 ms
Lead Channel Sensing Intrinsic Amplitude: 15.6 mV
Lead Channel Sensing Intrinsic Amplitude: 5.4 mV
Lead Channel Setting Pacing Amplitude: 2 V
Lead Channel Setting Pacing Pulse Width: 0.4 ms
MDC IDC MSMT LEADCHNL RA IMPEDANCE VALUE: 370 Ohm
MDC IDC MSMT LEADCHNL RA PACING THRESHOLD AMPLITUDE: 0.7 V
MDC IDC MSMT LEADCHNL RV PACING THRESHOLD PULSEWIDTH: 0.4 ms
MDC IDC SET LEADCHNL RV PACING AMPLITUDE: 2.4 V
MDC IDC STAT BRADY RA PERCENT PACED: 59 %
MDC IDC STAT BRADY RV PERCENT PACED: 2 %

## 2014-06-14 NOTE — Progress Notes (Signed)
Patient presents for device clinic pacemaker check.  No problems with shortness of breath, chest pain, palpitations, or syncope.  Device interrogated and found to be functioning normally.  No changes made today.  See PaceArt report for full details.  Plan ROV with Dr. Caryl Comes in 6 months.  Ranee Gosselin, RN, BSN 06/14/2014 10:22 AM

## 2014-07-05 ENCOUNTER — Encounter: Payer: Self-pay | Admitting: Internal Medicine

## 2014-08-16 ENCOUNTER — Other Ambulatory Visit: Payer: Self-pay | Admitting: Internal Medicine

## 2014-09-20 ENCOUNTER — Telehealth: Payer: Self-pay

## 2014-09-20 NOTE — Telephone Encounter (Signed)
Call to educate on AWV; The patient states she is not interested. Asked if nurse had visited from [facility] and the patient stated she had declined that as well. Let her know if she changed her mind to call any time and schedule

## 2014-10-07 ENCOUNTER — Other Ambulatory Visit: Payer: Self-pay | Admitting: Internal Medicine

## 2014-12-12 ENCOUNTER — Encounter: Payer: Self-pay | Admitting: *Deleted

## 2014-12-14 ENCOUNTER — Encounter: Payer: Self-pay | Admitting: Internal Medicine

## 2014-12-14 ENCOUNTER — Ambulatory Visit (INDEPENDENT_AMBULATORY_CARE_PROVIDER_SITE_OTHER): Payer: Medicare Other | Admitting: Internal Medicine

## 2014-12-14 VITALS — BP 130/64 | HR 76 | Ht 62.0 in | Wt 149.6 lb

## 2014-12-14 DIAGNOSIS — Z950 Presence of cardiac pacemaker: Secondary | ICD-10-CM

## 2014-12-14 DIAGNOSIS — R55 Syncope and collapse: Secondary | ICD-10-CM | POA: Diagnosis not present

## 2014-12-14 NOTE — Progress Notes (Signed)
Patient Care Team: Lottie Dawson, MD as PCP - General (Internal Medicine) Deboraha Sprang, MD (Cardiology) Jacelyn Pi, MD (Endocrinology) Juanita Craver, MD (Gastroenterology)   HPI  Cindy [name] is a 69 y.o. female Seen in follow-up for vasovagal syncope for which she underwent Biotronik CLS pacing. She has had no interval syncope. She does have some orthostatic lightheadedness.  She has a little bit of peripheral edema. She has not had any further chest pain. There is no shortness of breath.  2015  abnormal Myoview having been taken for atypical chest pain prompted a catheterization. This was normal. This report was reviewed  Laboratories have been drawn by her PCP. We do not have access to them; however, she says that all her labs were normal apart from her interval  Past Medical History  Diagnosis Date  . Arthritis   . Pacemaker -Biotronik-CLS 08/20/2010  . Keloid 12/31/2010    incisional   . HYPERTENSION   . GRAVES' DISEASE 2013    I-131 ablation, now post tx hypothyroid state  . GERD   . Impaired glucose tolerance test   . Unspecified vitamin D deficiency   . Sciatica   . Anemia     hx iron def, no GI loss indentifed - resolved with diet change  . Syncope 08/20/2010    s/p PPM 08/2010 for sxc pause on loop recorder causing same  . Postablative hypothyroidism     Qualifier: Diagnosis of  By: Joya Gaskins CMA, [place]    . Abnormal nuclear stress test 12/15/2013    Past Surgical History  Procedure Laterality Date  . Pacemaker insertion    . Appendectomy    . Bunionectomy Left   . Breast biopsy Left 2001    guided excisional bx (L) breast microcalcification at 12 o'clock- Benign  . Right ankle  06/2010    Trimalleolar fracture  . Cataract extraction  2014  . Left heart catheterization with coronary angiogram N/A 12/22/2013    Procedure: LEFT HEART CATHETERIZATION WITH CORONARY ANGIOGRAM;  Surgeon: Sinclair Grooms, MD;  Location: [facility] CATH LAB;   Service: Cardiovascular;  Laterality: N/A;    Current Outpatient Prescriptions  Medication Sig Dispense Refill  . aspirin 81 MG tablet Take 81 mg by mouth daily.      . cholecalciferol (VITAMIN D) 1000 UNITS tablet Take 1,000 Units by mouth daily.      . diazepam (VALIUM) 5 MG tablet Take 1 tablet (5 mg total) by mouth daily as needed for muscle spasms (back pain). 30 tablet 0  . ferrous sulfate 325 (65 FE) MG tablet Take 325 mg by mouth daily with breakfast.    . levothyroxine (SYNTHROID, LEVOTHROID) 100 MCG tablet Take 100 mcg by mouth daily before breakfast.    . lisinopril (PRINIVIL,ZESTRIL) 20 MG tablet TAKE 1 TABLET (20 MG TOTAL) BY MOUTH DAILY. 90 tablet 1  . loratadine (CLARITIN) 10 MG tablet Take 10 mg by mouth daily as needed for allergies.     . Multiple Vitamins-Minerals (MULTIVITAMIN WITH MINERALS) tablet Take 1 tablet by mouth daily.    . naproxen (NAPROSYN) 250 MG tablet Take 250 mg by mouth daily as needed (pain).     . triamterene-hydrochlorothiazide (DYAZIDE) 37.5-25 MG per capsule Take 1 each (1 capsule total) by mouth daily. 90 capsule 1   No current facility-administered medications for this visit.    Allergies  Allergen Reactions  . Codeine Nausea And Vomiting    Review of Systems negative  except from HPI and PMH  Physical Exam BP 130/64 mmHg  Pulse 76  Ht 5\' 2"  (1.575 m)  Wt 149 lb 9.6 oz (67.858 kg)  BMI 27.36 kg/m2 Well developed and well nourished in no acute distress HENT normal E scleral and icterus clear Neck Supple JVP flat; carotids brisk and full Device pocket well healed; without hematoma or erythema.  There is no tethering  Clear to ausculation  Regular rate and rhythm, no murmurs gallops or rub Soft with active bowel sounds No clubbing cyanosis  Tr Edema Alert and oriented, grossly normal motor and sensory function Skin Warm and Dry    Assessment and  Plan     Vasovagal syncope  Pacemaker - biotronik   Overall doing well. In the  event that she needs augmented diuresis a when necessary loop diuretic might be of value.  We discussed the physiology of orthostatic intolerance in the role of the pacemaker.

## 2014-12-14 NOTE — Patient Instructions (Signed)
Medication Instructions: - no changes  Labwork: - none  Procedures/Testing: - none  Follow-Up: - Your physician wants you to follow-up in: 6 months with Device Clinic & 1 year with Chanetta Marshall, NP for Dr. Caryl Comes. You will receive a reminder letter in the mail two months in advance. If you don't receive a letter, please call our office to schedule the follow-up appointment.  Any Additional Special Instructions Will Be Listed Below (If Applicable). - none

## 2014-12-18 LAB — CUP PACEART INCLINIC DEVICE CHECK
Brady Statistic RA Percent Paced: 55 %
Lead Channel Pacing Threshold Amplitude: 0.9 V
Lead Channel Pacing Threshold Pulse Width: 0.4 ms
Lead Channel Pacing Threshold Pulse Width: 0.4 ms
Lead Channel Sensing Intrinsic Amplitude: 16.6 mV
Lead Channel Setting Pacing Amplitude: 2 V
Lead Channel Setting Pacing Amplitude: 2.4 V
Lead Channel Setting Pacing Pulse Width: 0.4 ms
MDC IDC MSMT LEADCHNL RA IMPEDANCE VALUE: 370 Ohm
MDC IDC MSMT LEADCHNL RA PACING THRESHOLD AMPLITUDE: 0.7 V
MDC IDC MSMT LEADCHNL RA SENSING INTR AMPL: 5.7 mV
MDC IDC MSMT LEADCHNL RV IMPEDANCE VALUE: 468 Ohm
MDC IDC PG SERIAL: 66089338
MDC IDC SESS DTM: 20160908135400
MDC IDC STAT BRADY RV PERCENT PACED: 2 %
Pulse Gen Model: 359524

## 2015-01-30 ENCOUNTER — Ambulatory Visit
Admission: RE | Admit: 2015-01-30 | Discharge: 2015-01-30 | Disposition: A | Discharge status: Home / Self Care | Payer: Medicare Other | Source: Ambulatory Visit | Attending: Internal Medicine | Admitting: Internal Medicine

## 2015-01-30 ENCOUNTER — Other Ambulatory Visit: Payer: Self-pay | Admitting: Internal Medicine

## 2015-01-30 DIAGNOSIS — G8929 Other chronic pain: Secondary | ICD-10-CM

## 2015-01-30 DIAGNOSIS — M545 Low back pain, unspecified: Secondary | ICD-10-CM

## 2015-04-23 ENCOUNTER — Encounter: Payer: Self-pay | Admitting: Internal Medicine

## 2015-04-28 DIAGNOSIS — M25551 Pain in right hip: Secondary | ICD-10-CM | POA: Diagnosis not present

## 2015-04-28 DIAGNOSIS — M5441 Lumbago with sciatica, right side: Secondary | ICD-10-CM | POA: Diagnosis not present

## 2015-04-28 DIAGNOSIS — M6281 Muscle weakness (generalized): Secondary | ICD-10-CM | POA: Diagnosis not present

## 2015-04-28 DIAGNOSIS — M4806 Spinal stenosis, lumbar region: Secondary | ICD-10-CM | POA: Diagnosis not present

## 2015-04-28 DIAGNOSIS — M7061 Trochanteric bursitis, right hip: Secondary | ICD-10-CM | POA: Diagnosis not present

## 2015-04-28 DIAGNOSIS — M5136 Other intervertebral disc degeneration, lumbar region: Secondary | ICD-10-CM | POA: Diagnosis not present

## 2015-04-30 ENCOUNTER — Other Ambulatory Visit: Payer: Self-pay | Admitting: Sports Medicine

## 2015-04-30 DIAGNOSIS — M25559 Pain in unspecified hip: Secondary | ICD-10-CM | POA: Diagnosis not present

## 2015-04-30 DIAGNOSIS — M25551 Pain in right hip: Secondary | ICD-10-CM

## 2015-05-07 ENCOUNTER — Ambulatory Visit
Admission: RE | Admit: 2015-05-07 | Discharge: 2015-05-07 | Disposition: A | Discharge status: Home / Self Care | Payer: PPO | Source: Ambulatory Visit | Attending: Sports Medicine | Admitting: Sports Medicine

## 2015-05-07 VITALS — BP 132/64 | HR 61

## 2015-05-07 DIAGNOSIS — D509 Iron deficiency anemia, unspecified: Secondary | ICD-10-CM | POA: Insufficient documentation

## 2015-05-07 DIAGNOSIS — M25551 Pain in right hip: Secondary | ICD-10-CM

## 2015-05-07 DIAGNOSIS — R7303 Prediabetes: Secondary | ICD-10-CM | POA: Insufficient documentation

## 2015-05-07 DIAGNOSIS — E0500 Thyrotoxicosis with diffuse goiter without thyrotoxic crisis or storm: Secondary | ICD-10-CM | POA: Insufficient documentation

## 2015-05-07 DIAGNOSIS — M4806 Spinal stenosis, lumbar region: Secondary | ICD-10-CM | POA: Diagnosis not present

## 2015-05-07 DIAGNOSIS — R55 Syncope and collapse: Secondary | ICD-10-CM | POA: Insufficient documentation

## 2015-05-07 DIAGNOSIS — M5416 Radiculopathy, lumbar region: Secondary | ICD-10-CM

## 2015-05-07 DIAGNOSIS — M8580 Other specified disorders of bone density and structure, unspecified site: Secondary | ICD-10-CM | POA: Insufficient documentation

## 2015-05-07 DIAGNOSIS — M5126 Other intervertebral disc displacement, lumbar region: Secondary | ICD-10-CM | POA: Diagnosis not present

## 2015-05-07 MED ORDER — IOHEXOL 180 MG/ML  SOLN
15.0000 mL | Freq: Once | INTRAMUSCULAR | Status: AC | PRN
  Administered 2015-05-07: 15 mL via INTRATHECAL

## 2015-05-07 MED ORDER — DIAZEPAM 5 MG PO TABS
5.0000 mg | ORAL_TABLET | Freq: Once | ORAL | Status: AC
  Administered 2015-05-07: 5 mg via ORAL

## 2015-05-07 NOTE — Discharge Instructions (Addendum)
Myelogram Discharge Instructions  1. Go home and rest quietly for the next 24 hours.  It is important to lie flat for the next 24 hours.  Get up only to go to the restroom.  You may lie in the bed or on a couch on your back, your stomach, your left side or your right side.  You may have one pillow under your head.  You may have pillows between your knees while you are on your side or under your knees while you are on your back.  2. DO NOT drive today.  Recline the seat as far back as it will go, while still wearing your seat belt, on the way home.  3. You may get up to go to the bathroom as needed.  You may sit up for 10 minutes to eat.  You may resume your normal diet and medications unless otherwise indicated.  Drink lots of extra fluids today and tomorrow.  4. The incidence of headache, nausea, or vomiting is about 5% (one in 20 patients).  If you develop a headache, lie flat and drink plenty of fluids until the headache goes away.  Caffeinated beverages may be helpful.  If you develop severe nausea and vomiting or a headache that does not go away with flat bed rest, call [phone].  5. You may resume normal activities after your 24 hours of bed rest is over; however, do not exert yourself strongly or do any heavy lifting tomorrow. If when you get up you have a headache when standing, go back to bed and force fluids for another 24 hours.  6. Call your physician for a follow-up appointment.  The results of your myelogram will be sent directly to your physician by the following day.  7. If you have any questions or if complications develop after you arrive home, please call [phone].  Discharge instructions have been explained to the patient.  The patient, or the person responsible for the patient, fully understands these instructions.       May resume Paxil on Jan. 31, 2017, after 9:30 am.

## 2015-05-07 NOTE — Progress Notes (Signed)
Patient states she has been off Paxil for at least the past two days.  Brita Romp, RN

## 2015-05-15 DIAGNOSIS — M7061 Trochanteric bursitis, right hip: Secondary | ICD-10-CM | POA: Diagnosis not present

## 2015-05-15 DIAGNOSIS — M5441 Lumbago with sciatica, right side: Secondary | ICD-10-CM | POA: Diagnosis not present

## 2015-05-15 DIAGNOSIS — G8929 Other chronic pain: Secondary | ICD-10-CM | POA: Diagnosis not present

## 2015-05-15 DIAGNOSIS — M5136 Other intervertebral disc degeneration, lumbar region: Secondary | ICD-10-CM | POA: Diagnosis not present

## 2015-06-06 DIAGNOSIS — Z1231 Encounter for screening mammogram for malignant neoplasm of breast: Secondary | ICD-10-CM | POA: Diagnosis not present

## 2015-06-20 ENCOUNTER — Encounter: Payer: Self-pay | Admitting: Internal Medicine

## 2015-06-20 ENCOUNTER — Ambulatory Visit (INDEPENDENT_AMBULATORY_CARE_PROVIDER_SITE_OTHER): Payer: PPO | Admitting: *Deleted

## 2015-06-20 DIAGNOSIS — I442 Atrioventricular block, complete: Secondary | ICD-10-CM

## 2015-06-20 LAB — CUP PACEART INCLINIC DEVICE CHECK
Brady Statistic RA Percent Paced: 58 %
Brady Statistic RV Percent Paced: 2 %
Date Time Interrogation Session: 20170315113547
Implantable Lead Implant Date: 20120606
Implantable Lead Location: 753859
Implantable Lead Model: 5076
Implantable Lead Model: 5076
Lead Channel Impedance Value: 429 Ohm
Lead Channel Pacing Threshold Amplitude: 0.8 V
Lead Channel Pacing Threshold Amplitude: 0.9 V
Lead Channel Pacing Threshold Pulse Width: 0.4 ms
Lead Channel Sensing Intrinsic Amplitude: 17 mV
Lead Channel Sensing Intrinsic Amplitude: 6.3 mV
Lead Channel Setting Pacing Amplitude: 2 V
Lead Channel Setting Pacing Pulse Width: 0.4 ms
MDC IDC LEAD IMPLANT DT: 20120606
MDC IDC LEAD LOCATION: 753860
MDC IDC MSMT LEADCHNL RA IMPEDANCE VALUE: 370 Ohm
MDC IDC MSMT LEADCHNL RA PACING THRESHOLD AMPLITUDE: 0.8 V
MDC IDC MSMT LEADCHNL RA PACING THRESHOLD PULSEWIDTH: 0.4 ms
MDC IDC MSMT LEADCHNL RA SENSING INTR AMPL: 6.3 mV
MDC IDC MSMT LEADCHNL RV PACING THRESHOLD AMPLITUDE: 0.9 V
MDC IDC MSMT LEADCHNL RV PACING THRESHOLD PULSEWIDTH: 0.4 ms
MDC IDC MSMT LEADCHNL RV PACING THRESHOLD PULSEWIDTH: 0.4 ms
MDC IDC SET LEADCHNL RV PACING AMPLITUDE: 2.4 V
Pulse Gen Serial Number: 66089338

## 2015-06-20 NOTE — Progress Notes (Signed)
Pacemaker check in clinic. Normal device function. Thresholds, sensing, impedances consistent with previous measurements. Device programmed to maximize longevity. 20 new episodes recorded---max dur. 3 mins 48 sec--1:1 SVT per EGMs. Device programmed at appropriate safety margins. Histogram distribution appropriate for patient activity level. Device programmed to optimize intrinsic conduction. Estimated longevity 8 years 4 months. Patient will follow up with AS in 6 months.

## 2015-07-12 DIAGNOSIS — Z961 Presence of intraocular lens: Secondary | ICD-10-CM | POA: Diagnosis not present

## 2015-07-12 DIAGNOSIS — H02834 Dermatochalasis of left upper eyelid: Secondary | ICD-10-CM | POA: Diagnosis not present

## 2015-07-12 DIAGNOSIS — H26493 Other secondary cataract, bilateral: Secondary | ICD-10-CM | POA: Diagnosis not present

## 2015-07-12 DIAGNOSIS — H02831 Dermatochalasis of right upper eyelid: Secondary | ICD-10-CM | POA: Diagnosis not present

## 2015-07-18 DIAGNOSIS — H26491 Other secondary cataract, right eye: Secondary | ICD-10-CM | POA: Diagnosis not present

## 2015-07-23 DIAGNOSIS — D179 Benign lipomatous neoplasm, unspecified: Secondary | ICD-10-CM | POA: Diagnosis not present

## 2015-07-23 DIAGNOSIS — D509 Iron deficiency anemia, unspecified: Secondary | ICD-10-CM | POA: Diagnosis not present

## 2015-07-23 DIAGNOSIS — I10 Essential (primary) hypertension: Secondary | ICD-10-CM | POA: Diagnosis not present

## 2015-07-23 DIAGNOSIS — M8588 Other specified disorders of bone density and structure, other site: Secondary | ICD-10-CM | POA: Diagnosis not present

## 2015-07-23 DIAGNOSIS — Z0000 Encounter for general adult medical examination without abnormal findings: Secondary | ICD-10-CM | POA: Diagnosis not present

## 2015-07-23 DIAGNOSIS — Z1389 Encounter for screening for other disorder: Secondary | ICD-10-CM | POA: Diagnosis not present

## 2015-07-23 DIAGNOSIS — E890 Postprocedural hypothyroidism: Secondary | ICD-10-CM | POA: Diagnosis not present

## 2015-07-23 DIAGNOSIS — R739 Hyperglycemia, unspecified: Secondary | ICD-10-CM | POA: Diagnosis not present

## 2015-07-31 DIAGNOSIS — D171 Benign lipomatous neoplasm of skin and subcutaneous tissue of trunk: Secondary | ICD-10-CM | POA: Diagnosis not present

## 2015-08-06 DIAGNOSIS — M5441 Lumbago with sciatica, right side: Secondary | ICD-10-CM | POA: Diagnosis not present

## 2015-08-06 DIAGNOSIS — M7061 Trochanteric bursitis, right hip: Secondary | ICD-10-CM | POA: Diagnosis not present

## 2015-08-06 DIAGNOSIS — M5136 Other intervertebral disc degeneration, lumbar region: Secondary | ICD-10-CM | POA: Diagnosis not present

## 2015-08-06 DIAGNOSIS — M25551 Pain in right hip: Secondary | ICD-10-CM | POA: Diagnosis not present

## 2015-08-20 DIAGNOSIS — H02421 Myogenic ptosis of right eyelid: Secondary | ICD-10-CM | POA: Diagnosis not present

## 2015-08-20 DIAGNOSIS — H53483 Generalized contraction of visual field, bilateral: Secondary | ICD-10-CM | POA: Diagnosis not present

## 2015-08-20 DIAGNOSIS — H04222 Epiphora due to insufficient drainage, left lacrimal gland: Secondary | ICD-10-CM | POA: Diagnosis not present

## 2015-08-20 DIAGNOSIS — H02422 Myogenic ptosis of left eyelid: Secondary | ICD-10-CM | POA: Diagnosis not present

## 2015-08-20 DIAGNOSIS — H02834 Dermatochalasis of left upper eyelid: Secondary | ICD-10-CM | POA: Diagnosis not present

## 2015-08-20 DIAGNOSIS — H04221 Epiphora due to insufficient drainage, right lacrimal gland: Secondary | ICD-10-CM | POA: Diagnosis not present

## 2015-08-20 DIAGNOSIS — H02132 Senile ectropion of right lower eyelid: Secondary | ICD-10-CM | POA: Diagnosis not present

## 2015-08-20 DIAGNOSIS — H02135 Senile ectropion of left lower eyelid: Secondary | ICD-10-CM | POA: Diagnosis not present

## 2015-08-20 DIAGNOSIS — D485 Neoplasm of uncertain behavior of skin: Secondary | ICD-10-CM | POA: Diagnosis not present

## 2015-08-20 DIAGNOSIS — L908 Other atrophic disorders of skin: Secondary | ICD-10-CM | POA: Diagnosis not present

## 2015-08-20 DIAGNOSIS — H02831 Dermatochalasis of right upper eyelid: Secondary | ICD-10-CM | POA: Diagnosis not present

## 2015-08-21 DIAGNOSIS — M8589 Other specified disorders of bone density and structure, multiple sites: Secondary | ICD-10-CM | POA: Diagnosis not present

## 2015-08-21 DIAGNOSIS — M859 Disorder of bone density and structure, unspecified: Secondary | ICD-10-CM | POA: Diagnosis not present

## 2015-08-22 DIAGNOSIS — M5136 Other intervertebral disc degeneration, lumbar region: Secondary | ICD-10-CM | POA: Diagnosis not present

## 2015-08-24 DIAGNOSIS — H53483 Generalized contraction of visual field, bilateral: Secondary | ICD-10-CM | POA: Diagnosis not present

## 2015-09-10 DIAGNOSIS — M6281 Muscle weakness (generalized): Secondary | ICD-10-CM | POA: Diagnosis not present

## 2015-09-10 DIAGNOSIS — M4806 Spinal stenosis, lumbar region: Secondary | ICD-10-CM | POA: Diagnosis not present

## 2015-09-10 DIAGNOSIS — G8929 Other chronic pain: Secondary | ICD-10-CM | POA: Diagnosis not present

## 2015-09-10 DIAGNOSIS — M5441 Lumbago with sciatica, right side: Secondary | ICD-10-CM | POA: Diagnosis not present

## 2015-09-25 ENCOUNTER — Encounter: Payer: Self-pay | Admitting: Internal Medicine

## 2015-10-05 ENCOUNTER — Encounter: Payer: Self-pay | Admitting: Nurse Practitioner

## 2015-10-05 ENCOUNTER — Ambulatory Visit (INDEPENDENT_AMBULATORY_CARE_PROVIDER_SITE_OTHER): Payer: PPO | Admitting: Nurse Practitioner

## 2015-10-05 ENCOUNTER — Encounter: Payer: Self-pay | Admitting: Internal Medicine

## 2015-10-05 VITALS — BP 116/58 | HR 76 | Ht 62.0 in | Wt 152.0 lb

## 2015-10-05 DIAGNOSIS — D649 Anemia, unspecified: Secondary | ICD-10-CM | POA: Diagnosis not present

## 2015-10-05 DIAGNOSIS — Z01818 Encounter for other preprocedural examination: Secondary | ICD-10-CM

## 2015-10-05 DIAGNOSIS — R55 Syncope and collapse: Secondary | ICD-10-CM

## 2015-10-05 NOTE — Progress Notes (Signed)
Electrophysiology Office Note Date: 10/05/2015  ID:  Lilee, Rementer [birth year], MRN [id number]  PCP: Lottie Dawson, MD Electrophysiologist: Caryl Comes  CC: Pacemaker follow-up and surgical clearance   ANTONITA MCKENDRY is a 70 y.o. female seen today for Dr Caryl Comes.  She presents today for routine electrophysiology followup and surgical clearance for eyelid surgery under general anesthesia.  Since last being seen in our clinic, the patient reports doing very well. She has had back problems requiring epidural and decreased exercise for the last several months. This has resulted in deconditioning and some shortness of breath with exertion.  She denies chest pain, palpitations, PND, orthopnea, nausea, vomiting, dizziness, syncope, edema, weight gain, or early satiety.  Cath 2015 demonstrated normal coronary arteries with normal LV function   Device History: Biotronik dual chamber PPM implanted 2012 for vasovagal syncope   Past Medical History  Diagnosis Date  . Arthritis   . Pacemaker -Biotronik-CLS 08/20/2010  . Keloid 12/31/2010    incisional   . HYPERTENSION   . GRAVES' DISEASE 2013    I-131 ablation, now post tx hypothyroid state  . GERD   . Impaired glucose tolerance test   . Unspecified vitamin D deficiency   . Sciatica   . Anemia     hx iron def, no GI loss indentifed - resolved with diet change  . Syncope 08/20/2010    s/p PPM 08/2010 for sxc pause on loop recorder causing same  . Postablative hypothyroidism     Qualifier: Diagnosis of  By: Joya Gaskins CMA, [place]    . Abnormal nuclear stress test 12/15/2013   Past Surgical History  Procedure Laterality Date  . Pacemaker insertion    . Appendectomy    . Bunionectomy Left   . Breast biopsy Left 2001    guided excisional bx (L) breast microcalcification at 12 o'clock- Benign  . Right ankle  06/2010    Trimalleolar fracture  . Cataract extraction  2014  . Left heart catheterization with coronary angiogram N/A  12/22/2013    Procedure: LEFT HEART CATHETERIZATION WITH CORONARY ANGIOGRAM;  Surgeon: Sinclair Grooms, MD;  Location: [facility] CATH LAB;  Service: Cardiovascular;  Laterality: N/A;    Current Outpatient Prescriptions  Medication Sig Dispense Refill  . aspirin 81 MG tablet Take 81 mg by mouth daily.      . cholecalciferol (VITAMIN D) 1000 UNITS tablet Take 1,000 Units by mouth daily. Reported on 06/20/2015    . levothyroxine (SYNTHROID, LEVOTHROID) 112 MCG tablet Take 112 mcg by mouth daily before breakfast.    . lisinopril (PRINIVIL,ZESTRIL) 20 MG tablet TAKE 1 TABLET (20 MG TOTAL) BY MOUTH DAILY. 90 tablet 1  . loratadine (CLARITIN) 10 MG tablet Take 10 mg by mouth daily as needed for allergies. Reported on 06/20/2015    . Multiple Vitamins-Minerals (MULTIVITAMIN WITH MINERALS) tablet Take 1 tablet by mouth daily.    . naproxen (NAPROSYN) 250 MG tablet Take 250 mg by mouth daily as needed (pain).     . triamterene-hydrochlorothiazide (DYAZIDE) 37.5-25 MG per capsule Take 1 each (1 capsule total) by mouth daily. 90 capsule 1   No current facility-administered medications for this visit.    Allergies:   Codeine   Social History: Social History   Social History  . Marital Status: Married    Spouse Name: N/A  . Number of Children: N/A  . Years of Education: N/A   Occupational History  . Not on file.   Social History Main Topics  .  Smoking status: Former Smoker    Types: Cigarettes    Quit date: 04/08/1967  . Smokeless tobacco: Never Used  . Alcohol Use: Yes     Comment: OCCASIONAL  . Drug Use: No  . Sexual Activity: Not on file   Other Topics Concern  . Not on file   Social History Narrative   ** Merged History Encounter **        Family History: Family History  Problem Relation Age of Onset  . Rheum arthritis Mother   . Emphysema Mother   . Hypertension Mother   . Osteoporosis Mother   . Prostate cancer Father   . Hypertension Father   . Osteoporosis Maternal  Grandmother   . Stroke Other   . Heart attack Father   . COPD Mother      Review of Systems: All other systems reviewed and are otherwise negative except as noted above.   Physical Exam: VS:  BP 116/58 mmHg  Pulse 76  Ht 5\' 2"  (1.575 m)  Wt 152 lb (68.947 kg)  BMI 27.79 kg/m2  SpO2 98% , BMI Body mass index is 27.79 kg/(m^2).  GEN- The patient is well appearing, alert and oriented x 3 today.   HEENT: normocephalic, atraumatic; sclera clear, conjunctiva pink; hearing intact; oropharynx clear; neck supple  Lungs- Clear to ausculation bilaterally, normal work of breathing.  No wheezes, rales, rhonchi Heart- Regular rate and rhythm, no murmurs, rubs or gallops  GI- soft, non-tender, non-distended, bowel sounds present  Extremities- no clubbing, cyanosis, or edema; DP/PT/radial pulses 2+ bilaterally MS- no significant deformity or atrophy Skin- warm and dry, no rash or lesion; PPM pocket well healed Psych- euthymic mood, full affect Neuro- strength and sensation are intact  PPM Interrogation- reviewed in detail today,  See PACEART report  EKG is ordered today EKG today demonstrates atrial pacing with intrinsic ventricular conduction  Recent Labs: No results found for requested labs within last 365 days.   Wt Readings from Last 3 Encounters:  10/05/15 152 lb (68.947 kg)  12/14/14 149 lb 9.6 oz (67.858 kg)  04/21/14 156 lb 1.9 oz (70.816 kg)     Other studies Reviewed: Additional studies/ records that were reviewed today include: Dr Olin Pia office notes  Assessment and Plan:  1.  Vasovagal syncope Normal PPM function See Pace Art report No changes today  2.  Surgical clearance  Pt is having eyelid surgery  She was able to achieve 4 METS without functional limitations prior to back issues Catheterization 2015 with no coronary disease Discussed with Dr Angelena Form - pt is at acceptable cardiac risk for surgery We are available if needed in perioperative  period   Current medicines are reviewed at length with the patient today.   The patient does not have concerns regarding her medicines.  The following changes were made today:  none  Labs/ tests ordered today include: none  Orders Placed This Encounter  Procedures  . CBC     Disposition:   Follow up with Dr Caryl Comes as scheduled    Signed, Chanetta Marshall, NP 10/05/2015 3:03 PM  [place] [address] [phone] (office) [phone] (fax)

## 2015-10-05 NOTE — Patient Instructions (Addendum)
Medication Instructions:   Your physician recommends that you continue on your current medications as directed. Please refer to the Current Medication list given to you today.   If you need a refill on your cardiac medications before your next appointment, please call your pharmacy.  Labwork: CBC    Testing/Procedures:  NONE ORDER TODAY    Follow-Up: Your physician wants you to follow-up in: [place].Marland Kitchen You will receive a reminder letter in the mail two months in advance. If you don't receive a letter, please call our office to schedule the follow-up appointment.     Any Other Special Instructions Will Be Listed Below (If Applicable).

## 2015-10-06 LAB — CBC
HEMATOCRIT: 33.8 % — AB (ref 35.0–45.0)
Hemoglobin: 11 g/dL — ABNORMAL LOW (ref 11.7–15.5)
MCH: 29.8 pg (ref 27.0–33.0)
MCHC: 32.5 g/dL (ref 32.0–36.0)
MCV: 91.6 fL (ref 80.0–100.0)
MPV: 9.8 fL (ref 7.5–12.5)
PLATELETS: 344 10*3/uL (ref 140–400)
RBC: 3.69 MIL/uL — ABNORMAL LOW (ref 3.80–5.10)
RDW: 14.2 % (ref 11.0–15.0)
WBC: 6.2 10*3/uL (ref 3.8–10.8)

## 2015-10-08 ENCOUNTER — Other Ambulatory Visit: Payer: Self-pay | Admitting: Ophthalmology

## 2015-10-08 ENCOUNTER — Telehealth: Payer: Self-pay | Admitting: *Deleted

## 2015-10-08 DIAGNOSIS — H02135 Senile ectropion of left lower eyelid: Secondary | ICD-10-CM | POA: Diagnosis not present

## 2015-10-08 DIAGNOSIS — D2311 Other benign neoplasm of skin of right eyelid, including canthus: Secondary | ICD-10-CM | POA: Diagnosis not present

## 2015-10-08 DIAGNOSIS — D485 Neoplasm of uncertain behavior of skin: Secondary | ICD-10-CM | POA: Diagnosis not present

## 2015-10-08 DIAGNOSIS — H04563 Stenosis of bilateral lacrimal punctum: Secondary | ICD-10-CM | POA: Diagnosis not present

## 2015-10-08 DIAGNOSIS — H02831 Dermatochalasis of right upper eyelid: Secondary | ICD-10-CM | POA: Diagnosis not present

## 2015-10-08 DIAGNOSIS — D2312 Other benign neoplasm of skin of left eyelid, including canthus: Secondary | ICD-10-CM | POA: Diagnosis not present

## 2015-10-08 DIAGNOSIS — H02132 Senile ectropion of right lower eyelid: Secondary | ICD-10-CM | POA: Diagnosis not present

## 2015-10-08 DIAGNOSIS — H02834 Dermatochalasis of left upper eyelid: Secondary | ICD-10-CM | POA: Diagnosis not present

## 2015-10-08 NOTE — Telephone Encounter (Signed)
-----   Message from Patsey Berthold, NP sent at 10/06/2015  9:28 AM EDT ----- Please notify patient of stable labs.

## 2015-10-11 ENCOUNTER — Telehealth: Payer: Self-pay | Admitting: *Deleted

## 2015-10-11 NOTE — Telephone Encounter (Signed)
-----   Message from Patsey Berthold, NP sent at 10/06/2015  9:28 AM EDT ----- Please notify patient of stable labs.

## 2015-10-17 LAB — CUP PACEART INCLINIC DEVICE CHECK
Date Time Interrogation Session: 20170712095607
Implantable Lead Location: 753859
Implantable Lead Model: 5076
MDC IDC LEAD IMPLANT DT: 20120606
MDC IDC LEAD IMPLANT DT: 20120606
MDC IDC LEAD LOCATION: 753860
MDC IDC PG SERIAL: 66089338

## 2016-01-11 DIAGNOSIS — M545 Low back pain: Secondary | ICD-10-CM | POA: Diagnosis not present

## 2016-01-17 ENCOUNTER — Other Ambulatory Visit: Payer: Self-pay | Admitting: Orthopedic Surgery

## 2016-01-17 DIAGNOSIS — M545 Low back pain: Secondary | ICD-10-CM

## 2016-01-23 DIAGNOSIS — E890 Postprocedural hypothyroidism: Secondary | ICD-10-CM | POA: Diagnosis not present

## 2016-01-23 DIAGNOSIS — I10 Essential (primary) hypertension: Secondary | ICD-10-CM | POA: Diagnosis not present

## 2016-01-23 DIAGNOSIS — R1310 Dysphagia, unspecified: Secondary | ICD-10-CM | POA: Diagnosis not present

## 2016-01-23 DIAGNOSIS — R7309 Other abnormal glucose: Secondary | ICD-10-CM | POA: Diagnosis not present

## 2016-01-23 DIAGNOSIS — E785 Hyperlipidemia, unspecified: Secondary | ICD-10-CM | POA: Diagnosis not present

## 2016-01-23 DIAGNOSIS — I4430 Unspecified atrioventricular block: Secondary | ICD-10-CM | POA: Diagnosis not present

## 2016-01-23 DIAGNOSIS — D509 Iron deficiency anemia, unspecified: Secondary | ICD-10-CM | POA: Diagnosis not present

## 2016-01-30 ENCOUNTER — Ambulatory Visit
Admission: RE | Admit: 2016-01-30 | Discharge: 2016-01-30 | Disposition: A | Discharge status: Home / Self Care | Payer: PPO | Source: Ambulatory Visit | Attending: Orthopedic Surgery | Admitting: Orthopedic Surgery

## 2016-01-30 DIAGNOSIS — M545 Low back pain: Secondary | ICD-10-CM | POA: Diagnosis not present

## 2016-02-01 DIAGNOSIS — M533 Sacrococcygeal disorders, not elsewhere classified: Secondary | ICD-10-CM | POA: Diagnosis not present

## 2016-02-06 DIAGNOSIS — N189 Chronic kidney disease, unspecified: Secondary | ICD-10-CM | POA: Diagnosis not present

## 2016-02-06 DIAGNOSIS — I129 Hypertensive chronic kidney disease with stage 1 through stage 4 chronic kidney disease, or unspecified chronic kidney disease: Secondary | ICD-10-CM | POA: Diagnosis not present

## 2016-02-06 DIAGNOSIS — E669 Obesity, unspecified: Secondary | ICD-10-CM | POA: Diagnosis not present

## 2016-02-06 DIAGNOSIS — M47816 Spondylosis without myelopathy or radiculopathy, lumbar region: Secondary | ICD-10-CM | POA: Diagnosis not present

## 2016-02-08 DIAGNOSIS — M533 Sacrococcygeal disorders, not elsewhere classified: Secondary | ICD-10-CM | POA: Diagnosis not present

## 2016-02-14 DIAGNOSIS — E890 Postprocedural hypothyroidism: Secondary | ICD-10-CM | POA: Diagnosis not present

## 2016-02-21 DIAGNOSIS — E890 Postprocedural hypothyroidism: Secondary | ICD-10-CM | POA: Diagnosis not present

## 2016-02-21 DIAGNOSIS — M533 Sacrococcygeal disorders, not elsewhere classified: Secondary | ICD-10-CM | POA: Diagnosis not present

## 2016-02-25 DIAGNOSIS — I10 Essential (primary) hypertension: Secondary | ICD-10-CM | POA: Diagnosis not present

## 2016-02-25 DIAGNOSIS — E890 Postprocedural hypothyroidism: Secondary | ICD-10-CM | POA: Diagnosis not present

## 2016-02-25 DIAGNOSIS — E663 Overweight: Secondary | ICD-10-CM | POA: Diagnosis not present

## 2016-02-25 DIAGNOSIS — Z6827 Body mass index (BMI) 27.0-27.9, adult: Secondary | ICD-10-CM | POA: Diagnosis not present

## 2016-03-07 DIAGNOSIS — M5416 Radiculopathy, lumbar region: Secondary | ICD-10-CM | POA: Diagnosis not present

## 2016-03-25 DIAGNOSIS — M5416 Radiculopathy, lumbar region: Secondary | ICD-10-CM | POA: Diagnosis not present

## 2016-03-27 DIAGNOSIS — R05 Cough: Secondary | ICD-10-CM | POA: Diagnosis not present

## 2016-03-27 DIAGNOSIS — B349 Viral infection, unspecified: Secondary | ICD-10-CM | POA: Diagnosis not present

## 2016-04-15 DIAGNOSIS — M5416 Radiculopathy, lumbar region: Secondary | ICD-10-CM | POA: Diagnosis not present

## 2016-04-22 DIAGNOSIS — E890 Postprocedural hypothyroidism: Secondary | ICD-10-CM | POA: Diagnosis not present

## 2016-04-27 ENCOUNTER — Encounter: Payer: Self-pay | Admitting: Student

## 2016-05-07 DIAGNOSIS — M47816 Spondylosis without myelopathy or radiculopathy, lumbar region: Secondary | ICD-10-CM | POA: Diagnosis not present

## 2016-05-07 DIAGNOSIS — M5416 Radiculopathy, lumbar region: Secondary | ICD-10-CM | POA: Diagnosis not present

## 2016-05-13 ENCOUNTER — Ambulatory Visit: Payer: PPO | Attending: Physical Medicine and Rehabilitation | Admitting: Physical Therapy

## 2016-05-13 DIAGNOSIS — M545 Low back pain, unspecified: Secondary | ICD-10-CM

## 2016-05-13 NOTE — Therapy (Signed)
[place], [place], [address] Phone: [phone]   Fax:  [phone]  Physical Therapy Evaluation  Patient Details  Name: Cindy [name] MRN: [id number] Date of Birth: December 21, 1945 Referring Provider: Dr. Mina Marble   Encounter Date: 05/13/2016      PT End of Session - 05/13/16 1153    Visit Number 1   Number of Visits 12   Date for PT Re-Evaluation 06/24/16   PT Start Time 1147   PT Stop Time 1240   PT Time Calculation (min) 53 min   Activity Tolerance Patient tolerated treatment well   Behavior During Therapy [facility] for tasks assessed/performed      Past Medical History:  Diagnosis Date  . Abnormal nuclear stress test 12/15/2013  . Anemia    hx iron def, no GI loss indentifed - resolved with diet change  . Arthritis   . GERD   . GRAVES' DISEASE 2013   I-131 ablation, now post tx hypothyroid state  . HYPERTENSION   . Impaired glucose tolerance test   . Keloid 12/31/2010   incisional   . Pacemaker -Biotronik-CLS 08/20/2010  . Postablative hypothyroidism    Qualifier: Diagnosis of  By: Joya Gaskins CMA, [place]    . Sciatica   . Syncope 08/20/2010   s/p PPM 08/2010 for sxc pause on loop recorder causing same  . Unspecified vitamin D deficiency     Past Surgical History:  Procedure Laterality Date  . APPENDECTOMY    . BREAST BIOPSY Left 2001   guided excisional bx (L) breast microcalcification at 12 o'clock- Benign  . BUNIONECTOMY Left   . CATARACT EXTRACTION  2014  . LEFT HEART CATHETERIZATION WITH CORONARY ANGIOGRAM N/A 12/22/2013   Procedure: LEFT HEART CATHETERIZATION WITH CORONARY ANGIOGRAM;  Surgeon: Sinclair Grooms, MD;  Location[facility] CATH LAB;  Service: Cardiovascular;  Laterality: N/A;  . PACEMAKER INSERTION    . Right ankle  06/2010   Trimalleolar fracture    There were no vitals filed for this visit.       Subjective Assessment - 05/13/16 1152    Subjective Pt with chronic low back pain.   It has worsened since she retired in 2.5 yrs ago.  Her symptoms include pain, stiffness, sensory sx and radicular pain has resolved.  Can't stand up straight in AM.  She has occ spasms.  Has to change positons.  Maintains soreness in mid to low back on Rt. side.  Denies red flags and LE weakness.  She has been doing Pilates for several months which has helped her.   Patient has had 2 injections which has helped her radiating pain.    Pertinent History pacemaker, HTN, Graves disease   Limitations Standing;Other (comment);Sitting;Walking;House hold activities;Lifting  has to have good positioning, sleeping , walking on uneven ground    How long can you sit comfortably? has to sit edge of chair or with back support    How long can you stand comfortably? not really limited < 1-2 hours    How long can you walk comfortably? not limited, could walk 20 min    Diagnostic tests CT and myelogram    Patient Stated Goals Pt would like to be able to travel without limitation of pain. Have less fear.    Currently in Pain? Yes   Pain Score 2    Pain Location Back   Pain Orientation Right;Lower;Mid   Pain Descriptors / Indicators Tightness;Sore   Pain Type Chronic pain  Pain Onset More than a month ago   Aggravating Factors  malpositioning    Pain Relieving Factors ice, heat, TENS unit, rest, meds , Pilates ex    Effect of Pain on Daily Activities pt has gained weight and would like to be able to move without fear of pain.             [facility] PT Assessment - 05/13/16 1205      Assessment   Medical Diagnosis low back pain    Referring Provider Dr. Mina Marble    Onset Date/Surgical Date --  chronic   Next MD Visit unknown   Prior Therapy Yes      Precautions   Precautions None     Restrictions   Weight Bearing Restrictions No     Balance Screen   Has the patient fallen in the past 6 months No  notices a decline    Has the patient had a decrease in activity level because of a fear of falling?  No    Is the patient reluctant to leave their home because of a fear of falling?  No     Home Ecologist residence     Prior Function   Level of Independence Independent     Cognition   Overall Cognitive Status Within Functional Limits for tasks assessed     Observation/Other Assessments   Focus on Therapeutic Outcomes (FOTO)  47%     Sensation   Light Touch Appears Intact     Posture/Postural Control   Posture/Postural Control Postural limitations   Postural Limitations Decreased lumbar lordosis     AROM   Lumbar Flexion 70   Lumbar Extension 35  discomfort    Lumbar - Right Side Bend 20  pain on R    Lumbar - Left Side Bend 30   Lumbar - Right Rotation 25%   Lumbar - Left Rotation 25%     Strength   Right Hip Flexion 4-/5   Left Hip Flexion 4-/5   Right Knee Flexion 4+/5   Right Knee Extension 5/5   Left Knee Flexion 5/5   Left Knee Extension 5/5     Palpation   Palpation comment TTP Rt. lumbar paraspinals and superior glutes                    [facility] Adult PT Treatment/Exercise - 05/13/16 1205      Self-Care   Self-Care Posture   Posture standing, sitting, PIlates    Other Self-Care Comments  HEP, Reformer     Lumbar Exercises: Machines for Strengthening   Other Lumbar Machine Exercise Pilates Reformer introcduction: pelvic tilt, neutral Tr A , Foot work 2 Red 1 blue heels and forefoot in parallel and with turnout , bridging 3 springs articulating with good technique.       Moist Heat Therapy   Number Minutes Moist Heat 10 Minutes   Moist Heat Location Lumbar Spine                PT Education - 05/13/16 1313    Education provided Yes   Education Details PT. POC, HEP, pilates Reformer    Person(s) Educated Patient   Methods Explanation;Handout;Demonstration;Tactile cues;Verbal cues   Comprehension Verbalized understanding;Need further instruction          PT Short Term Goals - 05/13/16 1321      PT  SHORT TERM GOAL #1   Title Pt will be I with HEP for  stabilization and flexibilty.    Time 2   Period Weeks   Status New     PT SHORT TERM GOAL #2   Title Pt will understand and demo body mechanics, Pilates principles and apply them to mat exercises, transferring on and off equipment in PT gym.    Time 2   Period Weeks   Status New           PT Long Term Goals - 2016-06-05 1322      PT LONG TERM GOAL #1   Title Pt will be I with more advanced  HEP at end of PT episode    Time 6   Period Weeks   Status New     PT LONG TERM GOAL #2   Title Pt will score <40% limited on FOTO to demo functional improvement.    Time 6   Period Weeks   Status New     PT LONG TERM GOAL #3   Title Pt will be able to stand from her bed in the AM with 25-50% less stiffness in spine.    Time 6   Period Weeks   Status New     PT LONG TERM GOAL #4   Title Pt will be able to report more confidence in her abilty to exercise, travel out of town without injuring her back.    Time 6   Period Weeks   Status New               Plan - Jun 05, 2016 1314    Clinical Impression Statement Patient presents for low complexity eval of midline to Rt. sided low back pain without radiculopathy.  She is familiar to me as I have been leading her Pilates Mat class for a few months.  She has decent LE and core strength, 4/5 .  She has spinal stiffness in all planes but flexion, pain with Rt. sidebending and ext.  She will do well with corrective exercises and cont work on core and abdominal strengthening to maximize her function.    Rehab Potential Excellent   PT Frequency 2x / week   PT Duration 6 weeks   PT Treatment/Interventions ADLs/Self Care Home Management;Moist Heat;Traction;Therapeutic activities;Therapeutic exercise;Ultrasound;Manual techniques;Passive range of motion;Functional mobility training;Cryotherapy;Neuromuscular re-education   PT Next Visit Plan check HEP, does she use her TENS unit (has  pacemaker), try manual to Rt. side , test MMT hip ext and abd    PT Home Exercise Plan L stab 1, bridging    Consulted and Agree with Plan of Care Patient      Patient will benefit from skilled therapeutic intervention in order to improve the following deficits and impairments:  Decreased mobility, Hypomobility, Impaired flexibility, Improper body mechanics, Pain, Increased fascial restricitons, Decreased range of motion, Decreased strength  Visit Diagnosis: Right-sided low back pain without sciatica, unspecified chronicity      G-Codes - 06/05/16 1326    Functional Assessment Tool Used FOTO   Functional Limitation Mobility: Walking and moving around   Mobility: Walking and Moving Around Current Status [phone]) At least 40 percent but less than 60 percent impaired, limited or restricted   Mobility: Walking and Moving Around Goal Status [phone]) At least 20 percent but less than 40 percent impaired, limited or restricted       Problem List Patient Active Problem List   Diagnosis Date Noted  . 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 Anemia, iron deficiency 05/07/2015  . 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 Osteopenia 05/07/2015  .  Neurocardiogenic syncope 05/07/2015  . Unable to lose weight 04/24/2014  . Lumbar radiculopathy, chronic 01/03/2014  . Abnormal nuclear stress test 12/15/2013  . Hyperthyroidism 10/06/2011  . Syncope 08/20/2010  . Pacemaker -Biotronik-CLS 08/20/2010  . HYPERTENSION 04/15/2010  . PREMATURE VENTRICULAR CONTRACTIONS 04/15/2010  . GERD 04/15/2010  . Postablative hypothyroidism     Daivik Overley 05/13/2016, 1:27 PM  [facility] [address] [place], [place], [address] Phone: [phone]   Fax:  [phone]  Name: Cindy [name] MRN: [id number] Date of Birth: 10/11/1945   Raeford Razor, PT 05/13/16 1:28 PM Phone: [phone] Fax: [phone]

## 2016-05-13 NOTE — Patient Instructions (Signed)
Gave patient Lumbar stab 1 handout, shoulder bridge and info on Transverse Abdominus muscle.

## 2016-05-20 ENCOUNTER — Ambulatory Visit: Payer: PPO | Admitting: Physical Therapy

## 2016-05-20 DIAGNOSIS — M545 Low back pain, unspecified: Secondary | ICD-10-CM

## 2016-05-20 NOTE — Therapy (Signed)
[place], [place], [address] Phone: [phone]   Fax:  [phone]  Physical Therapy Treatment  Patient Details  Name: Cindy [name] MRN: [id number] Date of Birth: November 21, 1945 Referring Provider: Dr. Mina Marble   Encounter Date: 05/20/2016      PT End of Session - 05/20/16 1018    Visit Number 2   Number of Visits 12   Date for PT Re-Evaluation 06/24/16   PT Start Time 0109   PT Stop Time 1100   PT Time Calculation (min) 45 min      Past Medical History:  Diagnosis Date  . Abnormal nuclear stress test 12/15/2013  . Anemia    hx iron def, no GI loss indentifed - resolved with diet change  . Arthritis   . GERD   . GRAVES' DISEASE 2013   I-131 ablation, now post tx hypothyroid state  . HYPERTENSION   . Impaired glucose tolerance test   . Keloid 12/31/2010   incisional   . Pacemaker -Biotronik-CLS 08/20/2010  . Postablative hypothyroidism    Qualifier: Diagnosis of  By: Joya Gaskins CMA, [place]    . Sciatica   . Syncope 08/20/2010   s/p PPM 08/2010 for sxc pause on loop recorder causing same  . Unspecified vitamin D deficiency     Past Surgical History:  Procedure Laterality Date  . APPENDECTOMY    . BREAST BIOPSY Left 2001   guided excisional bx (L) breast microcalcification at 12 o'clock- Benign  . BUNIONECTOMY Left   . CATARACT EXTRACTION  2014  . LEFT HEART CATHETERIZATION WITH CORONARY ANGIOGRAM N/A 12/22/2013   Procedure: LEFT HEART CATHETERIZATION WITH CORONARY ANGIOGRAM;  Surgeon: Sinclair Grooms, MD;  Location: [facility] CATH LAB;  Service: Cardiovascular;  Laterality: N/A;  . PACEMAKER INSERTION    . Right ankle  06/2010   Trimalleolar fracture    There were no vitals filed for this visit.      Subjective Assessment - 05/20/16 1016    Subjective I had a terrible back spasm saturday. I had to sleep on the sofa, on my side to prevent another spasm.    Currently in Pain? Yes   Pain Score 4    Pain Location Back   Pain Orientation Right;Mid;Lower   Pain Descriptors / Indicators Sore   Aggravating Factors  malpositioning.    Pain Relieving Factors ice, hmp, tens, rest, meds                          [facility] Adult PT Treatment/Exercise - 05/20/16 0001      Lumbar Exercises: Stretches   Passive Hamstring Stretch 3 reps;30 seconds   Passive Hamstring Stretch Limitations contract relax x 2 , right only    Lower Trunk Rotation 5 reps;10 seconds     Lumbar Exercises: Supine   Clam 20 reps   Clam Limitations cues for smaller clam, focusing core engagement    Bent Knee Raise Limitations Scissors- table top (one leg at a time) then down one leg at a time.    Other Supine Lumbar Exercises ball squeeze with posterior pelvic tilt     Knee/Hip Exercises: Stretches   Other Knee/Hip Stretches QL stretch in left sidelying x 1 minute     Moist Heat Therapy   Number Minutes Moist Heat 15 Minutes   Moist Heat Location Lumbar Spine     Manual Therapy   Manual Therapy Muscle Energy Technique;Soft tissue mobilization  Soft tissue mobilization right QL and right lumbar paraspinals, right gluteals, right ITB, peroformed in sidelying using massage roller, light pressure tolerated.    Muscle Energy Technique posteriorly rotated inominate -performed sidelying isometric hip flexion with anterior inominate mobilization                  PT Short Term Goals - 05/13/16 1321      PT SHORT TERM GOAL #1   Title Pt will be I with HEP for stabilization and flexibilty.    Time 2   Period Weeks   Status New     PT SHORT TERM GOAL #2   Title Pt will understand and demo body mechanics, Pilates principles and apply them to mat exercises, transferring on and off equipment in PT gym.    Time 2   Period Weeks   Status New           PT Long Term Goals - 05/13/16 1322      PT LONG TERM GOAL #1   Title Pt will be I with more advanced  HEP at end of PT episode    Time 6    Period Weeks   Status New     PT LONG TERM GOAL #2   Title Pt will score <40% limited on FOTO to demo functional improvement.    Time 6   Period Weeks   Status New     PT LONG TERM GOAL #3   Title Pt will be able to stand from her bed in the AM with 25-50% less stiffness in spine.    Time 6   Period Weeks   Status New     PT LONG TERM GOAL #4   Title Pt will be able to report more confidence in her abilty to exercise, travel out of town without injuring her back.    Time 6   Period Weeks   Status New               Plan - 05/20/16 1112    Clinical Impression Statement Discussed TENS contraindication due to pacemaker. Pt plans to discuss with her cardiologist. Pt hesitant to attend PT today due to having severe back spasm of 9/10 pain 3 days ago. She was unable to perform HEP afterward for fear of repeat spasm. Her pain remains mostly right sided. She appears to have a left posteriorly rotated inominate so MET performed in sidelying. Upon standing pt reports pain decreased to 1/10 and feels pain is more centralized.    PT Next Visit Plan assess pelvic alignment, check HEP, does she use her TENS unit (has pacemaker), try manual to Rt. side , test MMT hip ext and abd    PT Home Exercise Plan L stab 1, bridging    Consulted and Agree with Plan of Care Patient      Patient will benefit from skilled therapeutic intervention in order to improve the following deficits and impairments:  Decreased mobility, Hypomobility, Impaired flexibility, Improper body mechanics, Pain, Increased fascial restricitons, Decreased range of motion, Decreased strength  Visit Diagnosis: Right-sided low back pain without sciatica, unspecified chronicity     Problem List Patient Active Problem List   Diagnosis Date Noted  . 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 Anemia, iron deficiency 05/07/2015  . 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 Osteopenia 05/07/2015  . Neurocardiogenic syncope 05/07/2015  . Unable to  lose weight 04/24/2014  . Lumbar radiculopathy, chronic 01/03/2014  . Abnormal nuclear stress test 12/15/2013  .  Hyperthyroidism 10/06/2011  . Syncope 08/20/2010  . Pacemaker -Biotronik-CLS 08/20/2010  . HYPERTENSION 04/15/2010  . PREMATURE VENTRICULAR CONTRACTIONS 04/15/2010  . GERD 04/15/2010  . Postablative hypothyroidism     Dorene Ar, [place] 05/20/2016, 11:23 AM  [facility] [address] [place], [place], [address] Phone: [phone]   Fax:  [phone]  Name: Cindy [name] MRN: [id number] Date of Birth: 1945/05/01

## 2016-05-22 ENCOUNTER — Ambulatory Visit: Payer: PPO | Admitting: Physical Therapy

## 2016-05-27 ENCOUNTER — Encounter: Payer: Self-pay | Admitting: Nurse Practitioner

## 2016-05-27 ENCOUNTER — Ambulatory Visit: Payer: PPO | Admitting: Physical Therapy

## 2016-05-27 DIAGNOSIS — M545 Low back pain, unspecified: Secondary | ICD-10-CM

## 2016-05-27 NOTE — Patient Instructions (Addendum)
BACK: Child's Pose (Sciatica)    Sit in knee-chest position and reach arms forward. Separate knees for comfort. Hold position for _5__ breaths. Repeat __1-2_ times. Do __1_ times per day. OR AS NEEDED   Copyright  VHI. All rights reserved.    Piriformis Stretch    Lying on back, pull right knee toward opposite shoulder. Hold ___30_ seconds. Repeat __3__ times. Do ___2_ sessions per day.  http://[email or web address].exer.us/258   Copyright  VHI. All rights reserved.

## 2016-05-27 NOTE — Therapy (Signed)
[place], [place], [address] Phone: [phone]   Fax:  [phone]  Physical Therapy Treatment  Patient Details  Name: Cindy [name] MRN: [id number] Date of Birth: 10/28/1945 Referring Provider: Dr. Mina Marble   Encounter Date: 05/27/2016      PT End of Session - 05/27/16 1031    Visit Number 3   Number of Visits 12   Date for PT Re-Evaluation 06/24/16   PT Start Time 1015   PT Stop Time 1120   PT Time Calculation (min) 65 min   Activity Tolerance Patient tolerated treatment well   Behavior During Therapy [facility] for tasks assessed/performed      Past Medical History:  Diagnosis Date  . Abnormal nuclear stress test 12/15/2013  . Anemia    hx iron def, no GI loss indentifed - resolved with diet change  . Arthritis   . GERD   . GRAVES' DISEASE 2013   I-131 ablation, now post tx hypothyroid state  . HYPERTENSION   . Impaired glucose tolerance test   . Keloid 12/31/2010   incisional   . Pacemaker -Biotronik-CLS 08/20/2010  . Postablative hypothyroidism    Qualifier: Diagnosis of  By: Joya Gaskins CMA, [place]    . Sciatica   . Syncope 08/20/2010   s/p PPM 08/2010 for sxc pause on loop recorder causing same  . Unspecified vitamin D deficiency     Past Surgical History:  Procedure Laterality Date  . APPENDECTOMY    . BREAST BIOPSY Left 2001   guided excisional bx (L) breast microcalcification at 12 o'clock- Benign  . BUNIONECTOMY Left   . CATARACT EXTRACTION  2014  . LEFT HEART CATHETERIZATION WITH CORONARY ANGIOGRAM N/A 12/22/2013   Procedure: LEFT HEART CATHETERIZATION WITH CORONARY ANGIOGRAM;  Surgeon: Sinclair Grooms, MD;  Location: [facility] CATH LAB;  Service: Cardiovascular;  Laterality: N/A;  . PACEMAKER INSERTION    . Right ankle  06/2010   Trimalleolar fracture    There were no vitals filed for this visit.      Subjective Assessment - 05/27/16 1022    Subjective I'm about the same, status quo,  1/10.  I have pain wherever you touch me.              [facility] PT Assessment - 05/27/16 1026      Strength   Right Hip Extension 4/5   Right Hip ABduction 4/5   Left Hip Extension 4/5   Left Hip ABduction 4+/5             [facility] Adult PT Treatment/Exercise - 05/27/16 1036      Lumbar Exercises: Supine   Ab Set 10 reps   AB Set Limitations ball squeeze   Clam 10 reps   Bridge 10 reps   Bridge Limitations iso hold with ball squeeze, 3 sets of varied stab. ec    Straight Leg Raise 10 reps   Straight Leg Raises Limitations ball under sacrum    Other Supine Lumbar Exercises supine march with ball under sacrum to destabilize      Knee/Hip Exercises: Stretches   Active Hamstring Stretch Both;2 reps;30 seconds   Piriformis Stretch Both;2 reps;30 seconds     Moist Heat Therapy   Number Minutes Moist Heat 15 Minutes   Moist Heat Location Lumbar Spine     Manual Therapy   Soft tissue mobilization bilateral lumbar paraspinals and into superior glutes, tender small trigger points along Rt. SIJ    Muscle  Energy Technique posteriorly rotated inominate -performed supine isometric hip flexion with anterior inominate mobilization  used manual and with a cane                 PT Education - 05/27/16 1108    Education provided Yes   Education Details HEP , stretch vs pain , MET    Person(s) Educated Patient   Methods Explanation;Demonstration;Handout   Comprehension Verbalized understanding;Returned demonstration          PT Short Term Goals - 05/27/16 1045      PT SHORT TERM GOAL #1   Title Pt will be I with HEP for stabilization and flexibilty.    Status Achieved     PT SHORT TERM GOAL #2   Title Pt will understand and demo body mechanics, Pilates principles and apply them to mat exercises, transferring on and off equipment in PT gym.    Status Achieved           PT Long Term Goals - 05/27/16 1046      PT LONG TERM GOAL #1   Title Pt will be I with more  advanced  HEP at end of PT episode    Status On-going     PT LONG TERM GOAL #2   Title Pt will score <40% limited on FOTO to demo functional improvement.    Status Unable to assess     PT LONG TERM GOAL #3   Title Pt will be able to stand from her bed in the AM with 25-50% less stiffness in spine.    Status On-going     PT LONG TERM GOAL #4   Title Pt will be able to report more confidence in her abilty to exercise, travel out of town without injuring her back.    Status On-going               Plan - 05/27/16 1111    Clinical Impression Statement Pt plans to email cardiologist Re: TENS and pacemaker.  She is I with Lumbar stab 1 exercises, added in MET to correct Rt post innominate which may be a compensation for Rt. sided lumbar and hip pain. She normally does not tolerate soft tissue work well but today she had no pain while in prone after soft tissue.     PT Next Visit Plan repeat manual and stability, resume Reformer ot Tower   PT Home Exercise Plan L stab 1, bridging , piriformis and MET and child pose for rest    Consulted and Agree with Plan of Care Patient      Patient will benefit from skilled therapeutic intervention in order to improve the following deficits and impairments:  Decreased mobility, Hypomobility, Impaired flexibility, Improper body mechanics, Pain, Increased fascial restricitons, Decreased range of motion, Decreased strength  Visit Diagnosis: Right-sided low back pain without sciatica, unspecified chronicity     Problem List Patient Active Problem List   Diagnosis Date Noted  . 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 Anemia, iron deficiency 05/07/2015  . 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 Osteopenia 05/07/2015  . Neurocardiogenic syncope 05/07/2015  . Unable to lose weight 04/24/2014  . Lumbar radiculopathy, chronic 01/03/2014  . Abnormal nuclear stress test 12/15/2013  . Hyperthyroidism 10/06/2011  . Syncope 08/20/2010  . Pacemaker -Biotronik-CLS  08/20/2010  . HYPERTENSION 04/15/2010  . PREMATURE VENTRICULAR CONTRACTIONS 04/15/2010  . GERD 04/15/2010  . Postablative hypothyroidism     Cindy [name] 05/27/2016, 11:20 AM  [facility]  [place], [place], [address] Phone: [phone]   Fax:  [phone]  Name: Cindy [name] MRN: [id number] Date of Birth: 04-Nov-1945  Raeford Razor, PT 05/27/16 11:20 AM Phone: [phone] Fax: [phone]

## 2016-05-29 ENCOUNTER — Ambulatory Visit: Payer: PPO | Admitting: Physical Therapy

## 2016-05-29 ENCOUNTER — Encounter: Payer: Self-pay | Admitting: Physical Therapy

## 2016-05-29 DIAGNOSIS — R42 Dizziness and giddiness: Secondary | ICD-10-CM | POA: Diagnosis not present

## 2016-05-29 DIAGNOSIS — M545 Low back pain, unspecified: Secondary | ICD-10-CM

## 2016-05-29 DIAGNOSIS — D509 Iron deficiency anemia, unspecified: Secondary | ICD-10-CM | POA: Diagnosis not present

## 2016-05-29 DIAGNOSIS — E785 Hyperlipidemia, unspecified: Secondary | ICD-10-CM | POA: Diagnosis not present

## 2016-05-29 NOTE — Therapy (Signed)
[place], [place], [address] Phone: [phone]   Fax:  [phone]  Physical Therapy Treatment  Patient Details  Name: Cindy [name] MRN: [id number] Date of Birth: Nov 24, 1945 Referring Provider: Dr. Mina Marble   Encounter Date: 05/29/2016      PT End of Session - 05/29/16 1151    Visit Number 4   Number of Visits 12   Date for PT Re-Evaluation 06/24/16   PT Start Time 1147   PT Stop Time 1244   PT Time Calculation (min) 57 min   Activity Tolerance Patient tolerated treatment well   Behavior During Therapy [facility] for tasks assessed/performed      Past Medical History:  Diagnosis Date  . Abnormal nuclear stress test 12/15/2013  . Anemia    hx iron def, no GI loss indentifed - resolved with diet change  . Arthritis   . GERD   . GRAVES' DISEASE 2013   I-131 ablation, now post tx hypothyroid state  . HYPERTENSION   . Impaired glucose tolerance test   . Keloid 12/31/2010   incisional   . Pacemaker -Biotronik-CLS 08/20/2010  . Postablative hypothyroidism    Qualifier: Diagnosis of  By: Joya Gaskins CMA, [place]    . Sciatica   . Syncope 08/20/2010   s/p PPM 08/2010 for sxc pause on loop recorder causing same  . Unspecified vitamin D deficiency     Past Surgical History:  Procedure Laterality Date  . APPENDECTOMY    . BREAST BIOPSY Left 2001   guided excisional bx (L) breast microcalcification at 12 o'clock- Benign  . BUNIONECTOMY Left   . CATARACT EXTRACTION  2014  . LEFT HEART CATHETERIZATION WITH CORONARY ANGIOGRAM N/A 12/22/2013   Procedure: LEFT HEART CATHETERIZATION WITH CORONARY ANGIOGRAM;  Surgeon: Sinclair Grooms, MD;  Location: [facility] CATH LAB;  Service: Cardiovascular;  Laterality: N/A;  . PACEMAKER INSERTION    . Right ankle  06/2010   Trimalleolar fracture    There were no vitals filed for this visit.      Subjective Assessment - 05/29/16 1146    Subjective Cardiologist OK's TENS to low back  because she is not pacemaker dependent. Possibility of anemia, waiting for lab results. Tried sleeping in bed and it was tough.  Has to sleep on sofa so I couldn't roll.    Currently in Pain? Yes   Pain Location Back   Pain Orientation Right;Mid;Lower   Pain Descriptors / Indicators Sore   Pain Type Chronic pain   Pain Onset More than a month ago            [facility] Adult PT Treatment/Exercise - 05/29/16 1202      Lumbar Exercises: Stretches   Quadruped Mid Back Stretch 3 reps;30 seconds   Quadruped Mid Back Stretch Limitations childs pose , added a lateral trunk stretch to each side      Lumbar Exercises: Aerobic   Stationary Bike NuStep L 5 for 7 min      Lumbar Exercises: Supine   Clam 10 reps   Bent Knee Raise 20 reps   Bent Knee Raise Limitations add dead bug    Other Supine Lumbar Exercises foam roller exercises for stab see above    Other Supine Lumbar Exercises horiz abd red band x 12      Lumbar Exercises: Quadruped   Other Quadruped Lumbar Exercises childs pose      Knee/Hip Exercises: Stretches   Hip Flexor Stretch Both;2 reps;30 seconds  Moist Heat Therapy   Number Minutes Moist Heat 12 Minutes   Moist Heat Location Lumbar Spine     Manual Therapy   Soft tissue mobilization bilateral lumbar paraspinals and into superior glutes, tender small trigger points along Rt. SIJ                 PT Education - 05/29/16 1211    Education provided Yes   Education Details gentle stability ex on foam roller    Person(s) Educated Patient   Methods Explanation;Demonstration   Comprehension Verbalized understanding;Returned demonstration          PT Short Term Goals - 05/27/16 1045      PT SHORT TERM GOAL #1   Title Pt will be I with HEP for stabilization and flexibilty.    Status Achieved     PT SHORT TERM GOAL #2   Title Pt will understand and demo body mechanics, Pilates principles and apply them to mat exercises, transferring on and off equipment in  PT gym.    Status Achieved           PT Long Term Goals - 05/27/16 1046      PT LONG TERM GOAL #1   Title Pt will be I with more advanced  HEP at end of PT episode    Status On-going     PT LONG TERM GOAL #2   Title Pt will score <40% limited on FOTO to demo functional improvement.    Status Unable to assess     PT LONG TERM GOAL #3   Title Pt will be able to stand from her bed in the AM with 25-50% less stiffness in spine.    Status On-going     PT LONG TERM GOAL #4   Title Pt will be able to report more confidence in her abilty to exercise, travel out of town without injuring her back.    Status On-going               Plan - 05/29/16 1214    Clinical Impression Statement Pt with other medical issues may be causing episodes of fatigue and dizziness.  She complained of mild back pain today but was able to do foam roller exercises with ease.  Reinforced body mechanics to preserve her back.  Contemplating dry needling.     PT Next Visit Plan repeat manual and stability, resume Reformer or Tower   PT Home Exercise Plan L stab 1, bridging , piriformis and MET and child pose for rest    Consulted and Agree with Plan of Care Patient      Patient will benefit from skilled therapeutic intervention in order to improve the following deficits and impairments:  Decreased mobility, Hypomobility, Impaired flexibility, Improper body mechanics, Pain, Increased fascial restricitons, Decreased range of motion, Decreased strength  Visit Diagnosis: Right-sided low back pain without sciatica, unspecified chronicity     Problem List Patient Active Problem List   Diagnosis Date Noted  . 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 Anemia, iron deficiency 05/07/2015  . 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 Osteopenia 05/07/2015  . Neurocardiogenic syncope 05/07/2015  . Unable to lose weight 04/24/2014  . Lumbar radiculopathy, chronic 01/03/2014  . Abnormal nuclear stress test 12/15/2013  .  Hyperthyroidism 10/06/2011  . Syncope 08/20/2010  . Pacemaker -Biotronik-CLS 08/20/2010  . HYPERTENSION 04/15/2010  . PREMATURE VENTRICULAR CONTRACTIONS 04/15/2010  . GERD 04/15/2010  . Postablative hypothyroidism     Cindy [name] 05/29/2016, 12:42 PM  [place]  [facility] [address] [place], [place], [address] Phone: [phone]   Fax:  [phone]  Name: Cindy [name] MRN: [id number] Date of Birth: July 04, 1945  Raeford Razor, PT 05/29/16 12:42 PM Phone: [phone] Fax: [phone]

## 2016-06-03 ENCOUNTER — Encounter: Payer: PPO | Admitting: Physical Therapy

## 2016-06-03 DIAGNOSIS — Z1211 Encounter for screening for malignant neoplasm of colon: Secondary | ICD-10-CM | POA: Diagnosis not present

## 2016-06-03 DIAGNOSIS — R194 Change in bowel habit: Secondary | ICD-10-CM | POA: Diagnosis not present

## 2016-06-03 DIAGNOSIS — D508 Other iron deficiency anemias: Secondary | ICD-10-CM | POA: Diagnosis not present

## 2016-06-03 DIAGNOSIS — K449 Diaphragmatic hernia without obstruction or gangrene: Secondary | ICD-10-CM | POA: Diagnosis not present

## 2016-06-05 ENCOUNTER — Ambulatory Visit: Payer: PPO | Admitting: Physical Therapy

## 2016-06-10 ENCOUNTER — Ambulatory Visit: Payer: PPO | Attending: Physical Medicine and Rehabilitation | Admitting: Physical Therapy

## 2016-06-10 DIAGNOSIS — M545 Low back pain, unspecified: Secondary | ICD-10-CM

## 2016-06-10 NOTE — Therapy (Signed)
[place], [place], [address] Phone: [phone]   Fax:  [phone]  Physical Therapy Treatment  Patient Details  Name: Cindy [name] MRN: [id number] Date of Birth: 11-24-1945 Referring Provider: Dr. Mina Marble   Encounter Date: 06/10/2016      PT End of Session - 06/10/16 1109    Visit Number 5   Number of Visits 12   Date for PT Re-Evaluation 06/24/16   PT Start Time 1108   PT Stop Time 1156   PT Time Calculation (min) 48 min   Activity Tolerance Patient tolerated treatment well;Patient limited by fatigue   Behavior During Therapy [facility] for tasks assessed/performed      Past Medical History:  Diagnosis Date  . Abnormal nuclear stress test 12/15/2013  . Anemia    hx iron def, no GI loss indentifed - resolved with diet change  . Arthritis   . GERD   . GRAVES' DISEASE 2013   I-131 ablation, now post tx hypothyroid state  . HYPERTENSION   . Impaired glucose tolerance test   . Keloid 12/31/2010   incisional   . Pacemaker -Biotronik-CLS 08/20/2010  . Postablative hypothyroidism    Qualifier: Diagnosis of  By: Joya Gaskins CMA, [place]    . Sciatica   . Syncope 08/20/2010   s/p PPM 08/2010 for sxc pause on loop recorder causing same  . Unspecified vitamin D deficiency     Past Surgical History:  Procedure Laterality Date  . APPENDECTOMY    . BREAST BIOPSY Left 2001   guided excisional bx (L) breast microcalcification at 12 o'clock- Benign  . BUNIONECTOMY Left   . CATARACT EXTRACTION  2014  . LEFT HEART CATHETERIZATION WITH CORONARY ANGIOGRAM N/A 12/22/2013   Procedure: LEFT HEART CATHETERIZATION WITH CORONARY ANGIOGRAM;  Surgeon: Sinclair Grooms, MD;  Location: [facility] CATH LAB;  Service: Cardiovascular;  Laterality: N/A;  . PACEMAKER INSERTION    . Right ankle  06/2010   Trimalleolar fracture    There were no vitals filed for this visit.      Subjective Assessment - 06/10/16 1107    Subjective Back has  been "in and out".  She has been on vacation at [employer], annual work related trip.  Just feeling blah, out of energy.  Anemia?    Currently in Pain? Yes   Pain Score 5    Pain Location Back   Pain Orientation Right;Mid;Lower   Pain Descriptors / Indicators Sore   Pain Type Chronic pain   Pain Onset More than a month ago   Aggravating Factors  stting too long    Pain Relieving Factors ice, heat, meds            [facility] Adult PT Treatment/Exercise - 06/10/16 1115      Lumbar Exercises: Stretches   Active Hamstring Stretch 2 reps;30 seconds   ITB Stretch 2 reps;30 seconds   Piriformis Stretch 2 reps;30 seconds     Lumbar Exercises: Aerobic   Stationary Bike NuStep L 5 for 3 min, out of breath, O 2 sat 96-97%      Lumbar Exercises: Standing   Other Standing Lumbar Exercises Freemotion core: unilateral shoudler ext on foam , single leg stance on foam , diagonal pull 1 plate for all      Lumbar Exercises: Supine   Heel Slides 10 reps   Heel Slides Limitations on ball    Bridge 10 reps   Bridge Limitations on ball , 2 sets  1 without UE (holding magic circle)      Moist Heat Therapy   Number Minutes Moist Heat 10 Minutes   Moist Heat Location Lumbar Spine                PT Education - 06/10/16 1256    Education provided Yes   Education Details core in standing    Person(s) Educated Patient   Methods Explanation   Comprehension Verbalized understanding          PT Short Term Goals - 06/10/16 1128      PT SHORT TERM GOAL #1   Title Pt will be I with HEP for stabilization and flexibilty.    Status Achieved     PT SHORT TERM GOAL #2   Title Pt will understand and demo body mechanics, Pilates principles and apply them to mat exercises, transferring on and off equipment in PT gym.    Status Achieved           PT Long Term Goals - 06/10/16 1129      PT LONG TERM GOAL #1   Title Pt will be I with more advanced  HEP at end of PT episode    Status On-going      PT LONG TERM GOAL #2   Title Pt will score <40% limited on FOTO to demo functional improvement.    Status Unable to assess     PT LONG TERM GOAL #3   Title Pt will be able to stand from her bed in the AM with 25-50% less stiffness in spine.    Status On-going     PT LONG TERM GOAL #4   Title Pt will be able to report more confidence in her abilty to exercise, travel out of town without injuring her back.    Status On-going               Plan - 06/10/16 1256    Clinical Impression Statement Pt reports mood improved after PT.  Back pain increased today due to long hours in the car yesterday.  Limited exercise today because of fatigue and general pain.    PT Next Visit Plan repeat manual and stability, resume Reformer or Tower   PT Home Exercise Plan L stab 1, bridging , piriformis and MET and child pose for rest    Consulted and Agree with Plan of Care Patient      Patient will benefit from skilled therapeutic intervention in order to improve the following deficits and impairments:  Decreased mobility, Hypomobility, Impaired flexibility, Improper body mechanics, Pain, Increased fascial restricitons, Decreased range of motion, Decreased strength  Visit Diagnosis: Right-sided low back pain without sciatica, unspecified chronicity     Problem List Patient Active Problem List   Diagnosis Date Noted  . 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 Anemia, iron deficiency 05/07/2015  . 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 Osteopenia 05/07/2015  . Neurocardiogenic syncope 05/07/2015  . Unable to lose weight 04/24/2014  . Lumbar radiculopathy, chronic 01/03/2014  . Abnormal nuclear stress test 12/15/2013  . Hyperthyroidism 10/06/2011  . Syncope 08/20/2010  . Pacemaker -Biotronik-CLS 08/20/2010  . HYPERTENSION 04/15/2010  . PREMATURE VENTRICULAR CONTRACTIONS 04/15/2010  . GERD 04/15/2010  . Postablative hypothyroidism     PAA,JENNIFER 06/10/2016, 1:00 PM  [facility] [address] [place], [place], [address] Phone: [phone]   Fax:  [phone]  Name: Cindy [name] MRN: [id number] Date of Birth: 01-08-1946  Raeford Razor, PT 06/10/16  1:00 PM Phone: [phone] Fax: [phone]

## 2016-06-12 ENCOUNTER — Ambulatory Visit: Payer: PPO | Admitting: Physical Therapy

## 2016-06-12 DIAGNOSIS — M545 Low back pain, unspecified: Secondary | ICD-10-CM

## 2016-06-12 NOTE — Therapy (Signed)
[place], [place], [address] Phone: [phone]   Fax:  [phone]  Physical Therapy Treatment  Patient Details  Name: Cindy [name] DOBLER MRN: [id number] Date of Birth: [birth year] Referring Provider: Dr. Mina Marble   Encounter Date: 06/12/2016      PT End of Session - 06/12/16 1353    Visit Number 6   Number of Visits 12   Date for PT Re-Evaluation 06/24/16   PT Start Time 6314   PT Stop Time 1415   PT Time Calculation (min) 42 min   Activity Tolerance Patient tolerated treatment well   Behavior During Therapy [facility] for tasks assessed/performed      Past Medical History:  Diagnosis Date  . Abnormal nuclear stress test 12/15/2013  . Anemia    hx iron def, no GI loss indentifed - resolved with diet change  . Arthritis   . GERD   . GRAVES' DISEASE 2013   I-131 ablation, now post tx hypothyroid state  . HYPERTENSION   . Impaired glucose tolerance test   . Keloid 12/31/2010   incisional   . Pacemaker -Biotronik-CLS 08/20/2010  . Postablative hypothyroidism    Qualifier: Diagnosis of  By: Joya Gaskins CMA, [place]    . Sciatica   . Syncope 08/20/2010   s/p PPM 08/2010 for sxc pause on loop recorder causing same  . Unspecified vitamin D deficiency     Past Surgical History:  Procedure Laterality Date  . APPENDECTOMY    . BREAST BIOPSY Left 2001   guided excisional bx (L) breast microcalcification at 12 o'clock- Benign  . BUNIONECTOMY Left   . CATARACT EXTRACTION  2014  . LEFT HEART CATHETERIZATION WITH CORONARY ANGIOGRAM N/A 12/22/2013   Procedure: LEFT HEART CATHETERIZATION WITH CORONARY ANGIOGRAM;  Surgeon: Sinclair Grooms, MD;  Location: [facility] CATH LAB;  Service: Cardiovascular;  Laterality: N/A;  . PACEMAKER INSERTION    . Right ankle  06/2010   Trimalleolar fracture    There were no vitals filed for this visit.      Subjective Assessment - 06/12/16 1336    Subjective Yesterday was a really bad day  (7/10) .  Today 1/10-2/10.     Currently in Pain? Yes   Pain Score 2    Pain Location Back   Pain Orientation Lower   Pain Descriptors / Indicators Aching   Pain Type Chronic pain   Pain Onset More than a month ago   Pain Frequency Intermittent   Aggravating Factors  sitting   Pain Relieving Factors stretching, heat, meds        Pilates Reformer used for LE/core strength, postural strength, lumbopelvic disassociation and core control.  Exercises included: Footwork 2 Red 1 blue parallel, turnout heels and forefoot x 15-20 reps  Heel raises x 10  Bridging 2 Red 1 blue  Hamstring with strap x 30 sec  Supine Arm work 1 [employer], T and parallel, circles  Feet in Straps 1 Red 1 Yellow, Arcs in parallel and hip ER, circles         [facility] Adult PT Treatment/Exercise - 06/12/16 0001      Manual Therapy   Manual Therapy Manual Traction   Manual therapy comments long axis distraction   Muscle Energy Technique posteriorly rotated inominate -performed supine isometric hip flexion with anterior inominate mobilization  used manual and with a cane                 PT Education -  06/12/16 1609    Education provided Yes   Education Details Pilates Reformer    Person(s) Educated Patient   Methods Explanation   Comprehension Verbalized understanding          PT Short Term Goals - 06/10/16 1128      PT SHORT TERM GOAL #1   Title Pt will be I with HEP for stabilization and flexibilty.    Status Achieved     PT SHORT TERM GOAL #2   Title Pt will understand and demo body mechanics, Pilates principles and apply them to mat exercises, transferring on and off equipment in PT gym.    Status Achieved           PT Long Term Goals - 06/10/16 1129      PT LONG TERM GOAL #1   Title Pt will be I with more advanced  HEP at end of PT episode    Status On-going     PT LONG TERM GOAL #2   Title Pt will score <40% limited on FOTO to demo functional improvement.    Status Unable to  assess     PT LONG TERM GOAL #3   Title Pt will be able to stand from her bed in the AM with 25-50% less stiffness in spine.    Status On-going     PT LONG TERM GOAL #4   Title Pt will be able to report more confidence in her abilty to exercise, travel out of town without injuring her back.    Status On-going               Plan - 06/12/16 1610    Clinical Impression Statement Pt with pain back to baseline after a painful day yesterday.  Progress is slow but she is able to do beginner level Reformer exercises with good form and good awareness, proprioception of core and lumbar spine.     PT Next Visit Plan check specific goals, Reformer, Tower and/or mat    PT Home Exercise Plan L stab 1, bridging , piriformis and MET and child pose for rest    Consulted and Agree with Plan of Care Patient      Patient will benefit from skilled therapeutic intervention in order to improve the following deficits and impairments:  Decreased mobility, Hypomobility, Impaired flexibility, Improper body mechanics, Pain, Increased fascial restricitons, Decreased range of motion, Decreased strength  Visit Diagnosis: Right-sided low back pain without sciatica, unspecified chronicity     Problem List Patient Active Problem List   Diagnosis Date Noted  . 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 Anemia, iron deficiency 05/07/2015  . 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 Osteopenia 05/07/2015  . Neurocardiogenic syncope 05/07/2015  . Unable to lose weight 04/24/2014  . Lumbar radiculopathy, chronic 01/03/2014  . Abnormal nuclear stress test 12/15/2013  . Hyperthyroidism 10/06/2011  . Syncope 08/20/2010  . Pacemaker -Biotronik-CLS 08/20/2010  . HYPERTENSION 04/15/2010  . PREMATURE VENTRICULAR CONTRACTIONS 04/15/2010  . GERD 04/15/2010  . Postablative hypothyroidism     Lanea Vankirk 06/12/2016, 4:12 PM  [facility] [address] [place], [place],  [address] Phone: [phone]   Fax:  [phone]  Name: JASMARIE COPPOCK MRN: [id number] Date of Birth: Sep 14, 1945  Raeford Razor, PT 06/12/16 4:13 PM Phone: [phone] Fax: [phone]

## 2016-06-24 ENCOUNTER — Ambulatory Visit: Payer: PPO | Admitting: Physical Therapy

## 2016-06-24 DIAGNOSIS — M545 Low back pain, unspecified: Secondary | ICD-10-CM

## 2016-06-24 NOTE — Therapy (Signed)
[place], [place], [address] Phone: [phone]   Fax:  [phone]  Physical Therapy Treatment  Patient Details  Name: Cindy [name] MRN: [id number] Date of Birth: [birth year] Referring Provider: Dr. Mina Marble   Encounter Date: 06/24/2016      PT End of Session - 06/24/16 1007    Visit Number 7   Number of Visits 20   Date for PT Re-Evaluation 07/22/16   PT Start Time 0935   PT Stop Time 1017   PT Time Calculation (min) 42 min   Activity Tolerance Patient tolerated treatment well   Behavior During Therapy [facility] for tasks assessed/performed      Past Medical History:  Diagnosis Date  . Abnormal nuclear stress test 12/15/2013  . Anemia    hx iron def, no GI loss indentifed - resolved with diet change  . Arthritis   . GERD   . GRAVES' DISEASE 2013   I-131 ablation, now post tx hypothyroid state  . HYPERTENSION   . Impaired glucose tolerance test   . Keloid 12/31/2010   incisional   . Pacemaker -Biotronik-CLS 08/20/2010  . Postablative hypothyroidism    Qualifier: Diagnosis of  By: Joya Gaskins CMA, [place]    . Sciatica   . Syncope 08/20/2010   s/p PPM 08/2010 for sxc pause on loop recorder causing same  . Unspecified vitamin D deficiency     Past Surgical History:  Procedure Laterality Date  . APPENDECTOMY    . BREAST BIOPSY Left 2001   guided excisional bx (L) breast microcalcification at 12 o'clock- Benign  . BUNIONECTOMY Left   . CATARACT EXTRACTION  2014  . LEFT HEART CATHETERIZATION WITH CORONARY ANGIOGRAM N/A 12/22/2013   Procedure: LEFT HEART CATHETERIZATION WITH CORONARY ANGIOGRAM;  Surgeon: Sinclair Grooms, MD;  Location: [facility] CATH LAB;  Service: Cardiovascular;  Laterality: N/A;  . PACEMAKER INSERTION    . Right ankle  06/2010   Trimalleolar fracture    There were no vitals filed for this visit.      Subjective Assessment - 06/24/16 0942    Subjective I have had a week without pain.   Just some mild stiffness from time to time.    Currently in Pain? Yes   Pain Score 2    Pain Location Back   Pain Orientation Lower   Pain Descriptors / Indicators Tightness   Pain Type Chronic pain   Pain Onset More than a month ago   Pain Frequency Intermittent        Pilates Reformer used for LE/core strength, postural strength, lumbopelvic disassociation and core control.  Exercises included:  Footwork  2 Red 1 Blue heels in parallel and turn out.  Calf stretch 2 Red 1 green and prancing  Bridging 3 springs x 8 , added clam alternating unilateral.   Supine Arms 1 Red 1 Yellow hold ball between knees   Supine Abs hundred prep 1 Red 1 Yellow x 5 (1 red to reduce work)   Quadruped 1 red then 1 blue LE only   Long box Prone pulling straps 1 Red needed to modify to 1 Blue due to scapular weakness  Glute press 1 Red each leg, parallel and hip ER each side                      PT Education - 06/24/16 1339    Education provided Yes   Education Details core control and activation Pilate Reformer  Person(s) Educated Patient   Methods Explanation   Comprehension Verbalized understanding;Need further instruction;Returned demonstration          PT Short Term Goals - 06/24/16 0938      PT SHORT TERM GOAL #1   Title Pt will be I with HEP for stabilization and flexibilty.    Status Achieved     PT SHORT TERM GOAL #2   Title Pt will understand and demo body mechanics, Pilates principles and apply them to mat exercises, transferring on and off equipment in PT gym.    Status Achieved           PT Long Term Goals - 06/24/16 1610      PT LONG TERM GOAL #1   Title Pt will be I with more advanced  HEP at end of PT episode    Status On-going     PT LONG TERM GOAL #2   Title Pt will score <40% limited on FOTO to demo functional improvement.    Status On-going     PT LONG TERM GOAL #3   Title Pt will be able to stand from her bed in the AM with 25-50%  less stiffness in spine.    Status Achieved     PT LONG TERM GOAL #4   Title Pt will be able to report more confidence in her abilty to exercise, travel out of town without injuring her back.    Status Partially Met               Plan - 06/24/16 1014    Clinical Impression Statement Patient has had a good week and is hopeful she can continue this as she works towards goals of functional strength and pain reduction.  Pilates equipment has given her the means to strengthen without pain increase.     PT Frequency 2x / week   PT Duration 4 weeks   PT Treatment/Interventions ADLs/Self Care Home Management;Moist Heat;Traction;Therapeutic activities;Therapeutic exercise;Ultrasound;Manual techniques;Passive range of motion;Functional mobility training;Cryotherapy;Neuromuscular re-education   PT Next Visit Plan Pilates Reformer/Tower for strength, manage pain with manual and modalities as needed    PT Home Exercise Plan L stab 1, bridging , piriformis and MET and child pose for rest    Consulted and Agree with Plan of Care Patient      Patient will benefit from skilled therapeutic intervention in order to improve the following deficits and impairments:  Decreased mobility, Hypomobility, Impaired flexibility, Improper body mechanics, Pain, Increased fascial restricitons, Decreased range of motion, Decreased strength  Visit Diagnosis: Right-sided low back pain without sciatica, unspecified chronicity     Problem List Patient Active Problem List   Diagnosis Date Noted  . 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 Anemia, iron deficiency 05/07/2015  . 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 Osteopenia 05/07/2015  . Neurocardiogenic syncope 05/07/2015  . Unable to lose weight 04/24/2014  . Lumbar radiculopathy, chronic 01/03/2014  . Abnormal nuclear stress test 12/15/2013  . Hyperthyroidism 10/06/2011  . Syncope 08/20/2010  . Pacemaker -Biotronik-CLS 08/20/2010  . HYPERTENSION 04/15/2010  . PREMATURE  VENTRICULAR CONTRACTIONS 04/15/2010  . GERD 04/15/2010  . Postablative hypothyroidism     Shaneika Rossa 06/24/2016, 1:41 PM  [facility] [address] [place], [place], [address] Phone: [phone]   Fax:  [phone]  Name: Cindy [name] MRN: [id number] Date of Birth: Jul 12, 1945  Raeford Razor, PT 06/24/16 1:42 PM Phone: [phone] Fax: [phone]

## 2016-06-26 ENCOUNTER — Ambulatory Visit: Payer: PPO | Admitting: Physical Therapy

## 2016-06-26 DIAGNOSIS — M545 Low back pain, unspecified: Secondary | ICD-10-CM

## 2016-06-26 DIAGNOSIS — Z1231 Encounter for screening mammogram for malignant neoplasm of breast: Secondary | ICD-10-CM | POA: Diagnosis not present

## 2016-06-26 NOTE — Therapy (Signed)
[place], [place], [address] Phone: [phone]   Fax:  [phone]  Physical Therapy Treatment  Patient Details  Name: Cindy [name] MRN: [id number] Date of Birth: September 19, 1945 Referring Provider: Dr. Mina Marble   Encounter Date: 06/26/2016      PT End of Session - 06/26/16 1028    Visit Number 8   Number of Visits 20   Date for PT Re-Evaluation 07/22/16   PT Start Time 1020   PT Stop Time 1100   PT Time Calculation (min) 40 min      Past Medical History:  Diagnosis Date  . Abnormal nuclear stress test 12/15/2013  . Anemia    hx iron def, no GI loss indentifed - resolved with diet change  . Arthritis   . GERD   . GRAVES' DISEASE 2013   I-131 ablation, now post tx hypothyroid state  . HYPERTENSION   . Impaired glucose tolerance test   . Keloid 12/31/2010   incisional   . Pacemaker -Biotronik-CLS 08/20/2010  . Postablative hypothyroidism    Qualifier: Diagnosis of  By: Joya Gaskins CMA, [place]    . Sciatica   . Syncope 08/20/2010   s/p PPM 08/2010 for sxc pause on loop recorder causing same  . Unspecified vitamin D deficiency     Past Surgical History:  Procedure Laterality Date  . APPENDECTOMY    . BREAST BIOPSY Left 2001   guided excisional bx (L) breast microcalcification at 12 o'clock- Benign  . BUNIONECTOMY Left   . CATARACT EXTRACTION  2014  . LEFT HEART CATHETERIZATION WITH CORONARY ANGIOGRAM N/A 12/22/2013   Procedure: LEFT HEART CATHETERIZATION WITH CORONARY ANGIOGRAM;  Surgeon: Sinclair Grooms, MD;  Location: [facility] CATH LAB;  Service: Cardiovascular;  Laterality: N/A;  . PACEMAKER INSERTION    . Right ankle  06/2010   Trimalleolar fracture    There were no vitals filed for this visit.      Subjective Assessment - 06/26/16 1021    Subjective Its no nice to have soreness in your body that is not related to your pain.    Currently in Pain? Yes   Pain Score 2    Pain Location Back   Pain  Orientation Lower   Pain Descriptors / Indicators Tightness   Aggravating Factors  sitting on soft surfaces, morning pain   Pain Relieving Factors sitting on support, memory foam mattress topper       Pilates Reformer used for LE/core strength, postural strength, lumbopelvic disassociation and core control.  Exercises included:  Footwork  2 Red 1 Blue heels in parallel and turn out, on toes parallel  Calf stretch 2 Red 1 green and prancing, heel raises   Bridging 3 springs x 8 , added clam alternating unilateral.   Supine Arms 1 Red 1 Yellow hold ball between knees   Supine Abs hundred prep 1 Red 1 Yellow x 5 (1 red to reduce work)   Quadruped 1 red then 1 blue LE only   Long box Prone pulling straps 1 Red needed to modify to 1 Blue due to scapular weakness  Glute press 1 Red each leg, parallel and hip ER each side          PT Short Term Goals - 06/24/16 6389      PT SHORT TERM GOAL #1   Title Pt will be I with HEP for stabilization and flexibilty.    Status Achieved     PT SHORT TERM  GOAL #2   Title Pt will understand and demo body mechanics, Pilates principles and apply them to mat exercises, transferring on and off equipment in PT gym.    Status Achieved           PT Long Term Goals - 06/26/16 1029      PT LONG TERM GOAL #1   Title Pt will be I with more advanced  HEP at end of PT episode    Time 6   Period Weeks   Status On-going     PT LONG TERM GOAL #2   Title Pt will score <40% limited on FOTO to demo functional improvement.    Baseline 25%   Time 6   Period Weeks   Status Achieved     PT LONG TERM GOAL #3   Title Pt will be able to stand from her bed in the AM with 25-50% less stiffness in spine.    Time 6   Period Weeks   Status Achieved     PT LONG TERM GOAL #4   Title Pt will be able to report more confidence in her abilty to exercise, travel out of town without injuring her back.    Time 6   Period Weeks   Status Achieved                Plan - 06/26/16 1027    Clinical Impression Statement Pt reports driving for 7 hours over 2 days which was not very bothersome. She wakes in the morning with 65-70% less pain in lower back. LTG# 2, #4 Met. Continued Reformer exercises without increased pain. Pt reports abdominal soreness after last visit.    PT Next Visit Plan Pilates Reformer/Tower for strength, manage pain with manual and modalities as needed    PT Home Exercise Plan L stab 1, bridging , piriformis and MET and child pose for rest    Consulted and Agree with Plan of Care Patient      Patient will benefit from skilled therapeutic intervention in order to improve the following deficits and impairments:  Decreased mobility, Hypomobility, Impaired flexibility, Improper body mechanics, Pain, Increased fascial restricitons, Decreased range of motion, Decreased strength  Visit Diagnosis: Right-sided low back pain without sciatica, unspecified chronicity     Problem List Patient Active Problem List   Diagnosis Date Noted  . 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 Anemia, iron deficiency 05/07/2015  . 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 Osteopenia 05/07/2015  . Neurocardiogenic syncope 05/07/2015  . Unable to lose weight 04/24/2014  . Lumbar radiculopathy, chronic 01/03/2014  . Abnormal nuclear stress test 12/15/2013  . Hyperthyroidism 10/06/2011  . Syncope 08/20/2010  . Pacemaker -Biotronik-CLS 08/20/2010  . HYPERTENSION 04/15/2010  . PREMATURE VENTRICULAR CONTRACTIONS 04/15/2010  . GERD 04/15/2010  . Postablative hypothyroidism     Dorene Ar, [place] 06/26/2016, 10:41 AM  [facility] [address] [place], [place], [address] Phone: [phone]   Fax:  [phone]  Name: Cindy [name] MRN: [id number] Date of Birth: January 05, 1946

## 2016-07-01 ENCOUNTER — Encounter: Payer: Self-pay | Admitting: Physical Therapy

## 2016-07-01 ENCOUNTER — Ambulatory Visit: Payer: PPO | Admitting: Physical Therapy

## 2016-07-01 DIAGNOSIS — M545 Low back pain, unspecified: Secondary | ICD-10-CM

## 2016-07-01 NOTE — Therapy (Signed)
[place], [place], [address] Phone: [phone]   Fax:  [phone]  Physical Therapy Treatment  Patient Details  Name: Cindy [name] MRN: [id number] Date of Birth: [birth year] Referring Provider: Dr. Mina Marble   Encounter Date: 07/01/2016      PT End of Session - 07/01/16 1019    Visit Number 9   Number of Visits 20   Date for PT Re-Evaluation 07/22/16   PT Start Time 1020   PT Stop Time 1101   PT Time Calculation (min) 41 min   Activity Tolerance Patient tolerated treatment well   Behavior During Therapy [facility] for tasks assessed/performed      Past Medical History:  Diagnosis Date  . Abnormal nuclear stress test 12/15/2013  . Anemia    hx iron def, no GI loss indentifed - resolved with diet change  . Arthritis   . GERD   . GRAVES' DISEASE 2013   I-131 ablation, now post tx hypothyroid state  . HYPERTENSION   . Impaired glucose tolerance test   . Keloid 12/31/2010   incisional   . Pacemaker -Biotronik-CLS 08/20/2010  . Postablative hypothyroidism    Qualifier: Diagnosis of  By: Joya Gaskins CMA, [place]    . Sciatica   . Syncope 08/20/2010   s/p PPM 08/2010 for sxc pause on loop recorder causing same  . Unspecified vitamin D deficiency     Past Surgical History:  Procedure Laterality Date  . APPENDECTOMY    . BREAST BIOPSY Left 2001   guided excisional bx (L) breast microcalcification at 12 o'clock- Benign  . BUNIONECTOMY Left   . CATARACT EXTRACTION  2014  . LEFT HEART CATHETERIZATION WITH CORONARY ANGIOGRAM N/A 12/22/2013   Procedure: LEFT HEART CATHETERIZATION WITH CORONARY ANGIOGRAM;  Surgeon: Sinclair Grooms, MD;  Location: [facility] CATH LAB;  Service: Cardiovascular;  Laterality: N/A;  . PACEMAKER INSERTION    . Right ankle  06/2010   Trimalleolar fracture    There were no vitals filed for this visit.      Subjective Assessment - 07/01/16 1022    Subjective Pt reports she got out of bed this  morning without pain.    Currently in Pain? Yes   Pain Score 2    Pain Location Back   Pain Orientation Lower   Pain Descriptors / Indicators Aching   Aggravating Factors  getting up in the morning, bending to don shoes                         [facility] Adult PT Treatment/Exercise - 07/01/16 0001      Lumbar Exercises: Aerobic   Stationary Bike nu step L4 5 min     Knee/Hip Exercises: Stretches   Active Hamstring Stretch Limitations both x10   Other Knee/Hip Stretches figure 4       Pilates Reformer used for LE/core strength, postural strength, lumbopelvic disassociation and core control. Exercises included:  Footwork 2 Red 1 Blue heels in parallel and turn out, on toes parallel Calf stretch 2 Red 1 green and prancing, heel raises   Bridging 3 springs x 8 , added clam alternating unilateral.   Supine Arms 1 Red 1 Yellow hold ball between knees   Supine Abs hundred prep 1 Red 1 Yellow x 5 (1 red to reduce work) (not done 3/27)  Quadruped 1 red then 1 blue LE only (not done 3/27  Long box Prone pulling straps 1 Red  needed to modify to 1 Blue due to scapular weakness (not done 3/27)  Glute press 1 Red each leg, parallel and hip ER each side            PT Education - 07/01/16 1100    Education provided Yes   Education Details exercise form/rationale, feeling similar contractions during functional activies   Person(s) Educated Patient   Methods Explanation;Demonstration;Verbal cues;Tactile cues   Comprehension Verbalized understanding;Returned demonstration;Verbal cues required;Need further instruction;Tactile cues required          PT Short Term Goals - 06/24/16 4235      PT SHORT TERM GOAL #1   Title Pt will be I with HEP for stabilization and flexibilty.    Status Achieved     PT SHORT TERM GOAL #2   Title Pt will understand and demo body mechanics, Pilates principles and apply them to mat exercises, transferring on and off equipment  in PT gym.    Status Achieved           PT Long Term Goals - 06/26/16 1029      PT LONG TERM GOAL #1   Title Pt will be I with more advanced  HEP at end of PT episode    Time 6   Period Weeks   Status On-going     PT LONG TERM GOAL #2   Title Pt will score <40% limited on FOTO to demo functional improvement.    Baseline 25%   Time 6   Period Weeks   Status Achieved     PT LONG TERM GOAL #3   Title Pt will be able to stand from her bed in the AM with 25-50% less stiffness in spine.    Time 6   Period Weeks   Status Achieved     PT LONG TERM GOAL #4   Title Pt will be able to report more confidence in her abilty to exercise, travel out of town without injuring her back.    Time 6   Period Weeks   Status Achieved               Plan - 07/01/16 1200    Clinical Impression Statement Pt able to complete exercises with difficulty today. "Twinge" sensation reported in one of the final repetitions of arm work- notable thoracic extension and loss of neutral spine in overhead with fatigue. Pt unable to perform more than 15 sidelying glut press on either side due to fatigue. Pt denied modalities following treatment.    PT Next Visit Plan Pilates Reformer/Tower for strength, manage pain with manual and modalities as needed    PT Home Exercise Plan L stab 1, bridging , piriformis and MET and child pose for rest    Consulted and Agree with Plan of Care Patient      Patient will benefit from skilled therapeutic intervention in order to improve the following deficits and impairments:     Visit Diagnosis: Right-sided low back pain without sciatica, unspecified chronicity     Problem List Patient Active Problem List   Diagnosis Date Noted  . 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 Anemia, iron deficiency 05/07/2015  . 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 Osteopenia 05/07/2015  . Neurocardiogenic syncope 05/07/2015  . Unable to lose weight 04/24/2014  . Lumbar radiculopathy, chronic  01/03/2014  . Abnormal nuclear stress test 12/15/2013  . Hyperthyroidism 10/06/2011  . Syncope 08/20/2010  . Pacemaker -Biotronik-CLS 08/20/2010  . HYPERTENSION 04/15/2010  . PREMATURE VENTRICULAR CONTRACTIONS 04/15/2010  .  GERD 04/15/2010  . Postablative hypothyroidism    Shadiyah Wernli C. Tachina Spoonemore PT, DPT 07/01/16 12:06 PM   [facility] [address] [place], [place], [address] Phone: [phone]   Fax:  [phone]  Name: ERIANNA JOLLY MRN: [id number] Date of Birth: [birth year]

## 2016-07-03 ENCOUNTER — Ambulatory Visit: Payer: PPO | Admitting: Physical Therapy

## 2016-07-03 DIAGNOSIS — M545 Low back pain, unspecified: Secondary | ICD-10-CM

## 2016-07-03 NOTE — Therapy (Signed)
Cindy [name], [place], [address] Phone: [phone]   Fax:  [phone]  Physical Therapy Treatment  Patient Details  Name: Cindy [name] MRN: [id number] Date of Birth: [birth year] Referring Provider: Dr. Mina Marble   Encounter Date: 07/03/2016      PT End of Session - 07/03/16 1344    Visit Number 10   Number of Visits 20   Date for PT Re-Evaluation 07/22/16   PT Start Time 1336   PT Stop Time 1419   PT Time Calculation (min) 43 min   Activity Tolerance Patient tolerated treatment well   Behavior During Therapy [facility] for tasks assessed/performed      Past Medical History:  Diagnosis Date  . Abnormal nuclear stress test 12/15/2013  . Anemia    hx iron def, no GI loss indentifed - resolved with diet change  . Arthritis   . GERD   . GRAVES' DISEASE 2013   I-131 ablation, now post tx hypothyroid state  . HYPERTENSION   . Impaired glucose tolerance test   . Keloid 12/31/2010   incisional   . Pacemaker -Biotronik-CLS 08/20/2010  . Postablative hypothyroidism    Qualifier: Diagnosis of  By: Joya Gaskins CMA, [place]    . Sciatica   . Syncope 08/20/2010   s/p PPM 08/2010 for sxc pause on loop recorder causing same  . Unspecified vitamin D deficiency     Past Surgical History:  Procedure Laterality Date  . APPENDECTOMY    . BREAST BIOPSY Left 2001   guided excisional bx (L) breast microcalcification at 12 o'clock- Benign  . BUNIONECTOMY Left   . CATARACT EXTRACTION  2014  . LEFT HEART CATHETERIZATION WITH CORONARY ANGIOGRAM N/A 12/22/2013   Procedure: LEFT HEART CATHETERIZATION WITH CORONARY ANGIOGRAM;  Surgeon: Sinclair Grooms, MD;  Location: [facility] CATH LAB;  Service: Cardiovascular;  Laterality: N/A;  . PACEMAKER INSERTION    . Right ankle  06/2010   Trimalleolar fracture    There were no vitals filed for this visit.      Subjective Assessment - 07/03/16 1339    Subjective Glutes are sore, back feels good!             [place] Adult PT Treatment/Exercise - 07/03/16 0001      Lumbar Exercises: Machines for Strengthening   Other Lumbar Machine Exercise Pilates Tower see note       Pilates Tower for LE/Core strength, postural strength, lumbopelvic disassociation and core control.  Exercises included:  Supine Leg [place] yellow arcs x 10 x 2 (hip ER) and hip circles   Sidelying Leg [place] adduction, sidekicks, stretch for gluteals   Piriformis stretch, figure 4   Arm [place]/Abdominals: Arcs, T with legs in hooklying, ext and then in tabletop. Yellow springs   Roll down bar: yellow x 10, cues for thoracic flexion    Cat Kneeling/Seated with push through bar  Used Peanut therapy ball to stretch obliques, QL and psoas 30 sec each side/. Overpressure to pelvis to elongate trunk   No pain post, declined modalities           PT Education - 07/03/16 1342    Education provided Yes   Education Details Pilates Tower and abdominals    Person(s) Educated Patient   Methods Explanation   Comprehension Verbalized understanding;Need further instruction;Verbal cues required;Tactile cues required          PT Short Term Goals - 06/24/16 0938      PT SHORT  TERM GOAL #1   Title Pt will be I with HEP for stabilization and flexibilty.    Status Achieved     PT SHORT TERM GOAL #2   Title Pt will understand and demo body mechanics, Pilates principles and apply them to mat exercises, transferring on and off equipment in PT gym.    Status Achieved           PT Long Term Goals - 08/01/16 1349      PT LONG TERM GOAL #1   Title Pt will be I with more advanced  HEP at end of PT episode    Status On-going     PT LONG TERM GOAL #2   Title Pt will score <40% limited on FOTO to demo functional improvement.    Status Achieved     PT LONG TERM GOAL #3   Title Pt will be able to stand from her bed in the AM with 25-50% less stiffness in spine.    Status Achieved     PT LONG TERM GOAL #4    Title Pt will be able to report more confidence in her abilty to exercise, travel out of town without injuring her back.    Status Achieved               Plan - 01-Aug-2016 1348    Clinical Impression Statement No pain increase today, has such a good awareness of core in supine and neutral with exercises.  Declined modalities. She will be starting another PIlates Mat class, will finish POC to ensure consistency and management of pain flare ups.    PT Next Visit Plan Pilates Reformer/Tower for strength, manage pain with manual and modalities as needed    PT Home Exercise Plan L stab 1, bridging , piriformis and MET and child pose for rest    Consulted and Agree with Plan of Care Patient      Patient will benefit from skilled therapeutic intervention in order to improve the following deficits and impairments:  Decreased mobility, Hypomobility, Impaired flexibility, Improper body mechanics, Pain, Increased fascial restricitons, Decreased range of motion, Decreased strength  Visit Diagnosis: Right-sided low back pain without sciatica, unspecified chronicity       G-Codes - 08/01/2016 1350    Functional Assessment Tool Used (Outpatient Only) FOTO   Functional Limitation Mobility: Walking and moving around   Mobility: Walking and Moving Around Current Status [phone]) At least 20 percent but less than 40 percent impaired, limited or restricted   Mobility: Walking and Moving Around Goal Status [phone]) At least 20 percent but less than 40 percent impaired, limited or restricted      Problem List Patient Active Problem List   Diagnosis Date Noted  . 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 Anemia, iron deficiency 05/07/2015  . 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 Osteopenia 05/07/2015  . Neurocardiogenic syncope 05/07/2015  . Unable to lose weight 04/24/2014  . Lumbar radiculopathy, chronic 01/03/2014  . Abnormal nuclear stress test 12/15/2013  . Hyperthyroidism 10/06/2011  . Syncope 08/20/2010   . Pacemaker -Biotronik-CLS 08/20/2010  . HYPERTENSION 04/15/2010  . PREMATURE VENTRICULAR CONTRACTIONS 04/15/2010  . GERD 04/15/2010  . Postablative hypothyroidism     Cindy [name] 08/01/2016, 2:31 PM  [facility] [address] [place], [place], [address] Phone: [phone]   Fax:  [phone]  Name: Cindy [name] MRN: [id number] Date of Birth: 01/06/1946  Raeford Razor, PT 08-01-16 2:31 PM Phone: [phone] Fax: [phone]

## 2016-07-08 ENCOUNTER — Ambulatory Visit: Payer: PPO | Attending: Physical Medicine and Rehabilitation | Admitting: Physical Therapy

## 2016-07-08 DIAGNOSIS — M545 Low back pain, unspecified: Secondary | ICD-10-CM

## 2016-07-08 NOTE — Therapy (Signed)
[place], [place], [address] Phone: [phone]   Fax:  [phone]  Physical Therapy Treatment  Patient Details  Name: Cindy [name] MRN: [id number] Date of Birth: [birth year] Referring Provider: Dr. Mina Marble   Encounter Date: 07/08/2016      PT End of Session - 07/08/16 1105    Visit Number 11   Number of Visits 20   Date for PT Re-Evaluation 07/22/16   PT Start Time 1020   PT Stop Time 1101   PT Time Calculation (min) 41 min   Activity Tolerance Patient tolerated treatment well   Behavior During Therapy [facility] for tasks assessed/performed      Past Medical History:  Diagnosis Date  . Abnormal nuclear stress test 12/15/2013  . Anemia    hx iron def, no GI loss indentifed - resolved with diet change  . Arthritis   . GERD   . GRAVES' DISEASE 2013   I-131 ablation, now post tx hypothyroid state  . HYPERTENSION   . Impaired glucose tolerance test   . Keloid 12/31/2010   incisional   . Pacemaker -Biotronik-CLS 08/20/2010  . Postablative hypothyroidism    Qualifier: Diagnosis of  By: Joya Gaskins CMA, [place]    . Sciatica   . Syncope 08/20/2010   s/p PPM 08/2010 for sxc pause on loop recorder causing same  . Unspecified vitamin D deficiency     Past Surgical History:  Procedure Laterality Date  . APPENDECTOMY    . BREAST BIOPSY Left 2001   guided excisional bx (L) breast microcalcification at 12 o'clock- Benign  . BUNIONECTOMY Left   . CATARACT EXTRACTION  2014  . LEFT HEART CATHETERIZATION WITH CORONARY ANGIOGRAM N/A 12/22/2013   Procedure: LEFT HEART CATHETERIZATION WITH CORONARY ANGIOGRAM;  Surgeon: Sinclair Grooms, MD;  Location: [facility] CATH LAB;  Service: Cardiovascular;  Laterality: N/A;  . PACEMAKER INSERTION    . Right ankle  06/2010   Trimalleolar fracture    There were no vitals filed for this visit.      Subjective Assessment - 07/08/16 1021    Subjective Went hiking.  Had a couple  mornings when I needed the cane to stand up.  The hike did not aggravate her back but did have some hip pain deep in the joint.             [place] Adult PT Treatment/Exercise - 07/08/16 1029      Lumbar Exercises: Machines for Strengthening   Other Lumbar Machine Exercise Pilates Reformer: see note        Pilates Reformer used for LE/core strength, postural strength, lumbopelvic disassociation and core control.  Exercises included:  Footwork  Bridging 3 springs with ball x 10, added press out x 5 .  Single leg bridge x 5 each leg, good LE alignment.    Supine Arm/ Abs 1 red 1 Yellow arcs and lower body/abd challenge: single leg stretch , double leg   Feet in Straps 1 Red 1 yellow adduction and Arcs with magic circle    Reverse Abdominals with short box 1 Blue double knee pull in, unable to do single leg even with spring modification.   Long box Prone 1 Blue pulling straps and T press x 8-10 reps, mod cues   Standing "cat stretch" for spinal articulation, added slight thoracic rotation 1 red spring        PT Education - 07/08/16 1105    Education provided Yes   Education Details  cont to reinforce alignment and abdominals    Person(s) Educated Patient   Methods Explanation;Demonstration;Tactile cues;Verbal cues   Comprehension Verbalized understanding          PT Short Term Goals - 06/24/16 3220      PT SHORT TERM GOAL #1   Title Pt will be I with HEP for stabilization and flexibilty.    Status Achieved     PT SHORT TERM GOAL #2   Title Pt will understand and demo body mechanics, Pilates principles and apply them to mat exercises, transferring on and off equipment in PT gym.    Status Achieved           PT Long Term Goals - 07/08/16 1114      PT LONG TERM GOAL #1   Title Pt will be I with more advanced  HEP at end of PT episode    Status On-going     PT LONG TERM GOAL #2   Title Pt will score <40% limited on FOTO to demo functional improvement.     Status Achieved     PT LONG TERM GOAL #3   Title Pt will be able to stand from her bed in the AM with 25-50% less stiffness in spine.    Baseline depends on the day.    Status Partially Met     PT LONG TERM GOAL #4   Title Pt will be able to report more confidence in her abilty to exercise, travel out of town without injuring her back.    Status Achieved               Plan - 07/08/16 1106    Clinical Impression Statement Pt doing well, dealing with intermittent back pain which has been ongoing and interfering with ADLs.  She has been more active with improved confidence.  She may try to get an appt with [facility] to see if he can inject the lumbar disc that aggravates her.     PT Next Visit Plan Pilates Reformer/Tower for strength, manage pain with manual and modalities as needed    PT Home Exercise Plan L stab 1, bridging , piriformis and MET and child pose for rest    Consulted and Agree with Plan of Care Patient      Patient will benefit from skilled therapeutic intervention in order to improve the following deficits and impairments:  Decreased mobility, Hypomobility, Impaired flexibility, Improper body mechanics, Pain, Increased fascial restricitons, Decreased range of motion, Decreased strength  Visit Diagnosis: Right-sided low back pain without sciatica, unspecified chronicity     Problem List Patient Active Problem List   Diagnosis Date Noted  . 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 Anemia, iron deficiency 05/07/2015  . 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 Osteopenia 05/07/2015  . Neurocardiogenic syncope 05/07/2015  . Unable to lose weight 04/24/2014  . Lumbar radiculopathy, chronic 01/03/2014  . Abnormal nuclear stress test 12/15/2013  . Hyperthyroidism 10/06/2011  . Syncope 08/20/2010  . Pacemaker -Biotronik-CLS 08/20/2010  . HYPERTENSION 04/15/2010  . PREMATURE VENTRICULAR CONTRACTIONS 04/15/2010  . GERD 04/15/2010  . Postablative hypothyroidism      [name] 07/08/2016, 11:15 AM  [facility] [address] [place], [place], [address] Phone: [phone]   Fax:  [phone]  Name: Cindy [name] MRN: [id number] Date of Birth: [birth year]  Raeford Razor, PT 07/08/16 11:16 AM Phone: [phone] Fax: [phone]

## 2016-07-10 ENCOUNTER — Ambulatory Visit: Payer: PPO | Admitting: Physical Therapy

## 2016-07-10 ENCOUNTER — Encounter: Payer: Self-pay | Admitting: Physical Therapy

## 2016-07-10 DIAGNOSIS — M545 Low back pain, unspecified: Secondary | ICD-10-CM

## 2016-07-10 NOTE — Therapy (Signed)
[place], [place], [address] Phone: [phone]   Fax:  [phone]  Physical Therapy Treatment  Patient Details  Name: Cindy [name] MRN: [id number] Date of Birth: Jul 27, 1945 Referring Provider: Dr. Mina Marble   Encounter Date: 07/10/2016      PT End of Session - 07/10/16 1103    Visit Number 12   Number of Visits 20   Date for PT Re-Evaluation 07/22/16   PT Start Time 1103   PT Stop Time 1145   PT Time Calculation (min) 42 min   Activity Tolerance Patient tolerated treatment well   Behavior During Therapy [facility] for tasks assessed/performed      Past Medical History:  Diagnosis Date  . Abnormal nuclear stress test 12/15/2013  . Anemia    hx iron def, no GI loss indentifed - resolved with diet change  . Arthritis   . GERD   . GRAVES' DISEASE 2013   I-131 ablation, now post tx hypothyroid state  . HYPERTENSION   . Impaired glucose tolerance test   . Keloid 12/31/2010   incisional   . Pacemaker -Biotronik-CLS 08/20/2010  . Postablative hypothyroidism    Qualifier: Diagnosis of  By: Joya Gaskins CMA, [place]    . Sciatica   . Syncope 08/20/2010   s/p PPM 08/2010 for sxc pause on loop recorder causing same  . Unspecified vitamin D deficiency     Past Surgical History:  Procedure Laterality Date  . APPENDECTOMY    . BREAST BIOPSY Left 2001   guided excisional bx (L) breast microcalcification at 12 o'clock- Benign  . BUNIONECTOMY Left   . CATARACT EXTRACTION  2014  . LEFT HEART CATHETERIZATION WITH CORONARY ANGIOGRAM N/A 12/22/2013   Procedure: LEFT HEART CATHETERIZATION WITH CORONARY ANGIOGRAM;  Surgeon: Sinclair Grooms, MD;  Location: [facility] CATH LAB;  Service: Cardiovascular;  Laterality: N/A;  . PACEMAKER INSERTION    . Right ankle  06/2010   Trimalleolar fracture    There were no vitals filed for this visit.      Subjective Assessment - 07/10/16 1103    Subjective Reports back has been better, had  a rough day yesterday and last night. 3/10 today after taking pain meds. Feels some soreness in obliques. Reports bilateral, lateral thigh pain. Thinks she is getting closer to a herniation.    How long can you sit comfortably? 1 hour +   How long can you stand comfortably? max 1 hour   How long can you walk comfortably? was able to walk 1 mile on a trail of varying terrain; not limited on flat surfaces.    Patient Stated Goals Pt would like to be able to travel without limitation of pain. Have less fear.    Currently in Pain? Yes   Pain Score 3    Pain Location Back   Pain Orientation Lower   Pain Descriptors / Indicators Sore   Aggravating Factors  standing up from toilet                         [facility] Adult PT Treatment/Exercise - 07/10/16 0001      Therapeutic Activites    Therapeutic Activities ADL's   ADL's log roll getting OOB     Lumbar Exercises: Aerobic   Stationary Bike nu step L5 4 min     Lumbar Exercises: Standing   Other Standing Lumbar Exercises hip hing with sit to stand  Lumbar Exercises: Supine   Bridge Limitations single leg bridge   Other Supine Lumbar Exercises glut sets with pillows bw knees                  PT Short Term Goals - 06/24/16 9450      PT SHORT TERM GOAL #1   Title Pt will be I with HEP for stabilization and flexibilty.    Status Achieved     PT SHORT TERM GOAL #2   Title Pt will understand and demo body mechanics, Pilates principles and apply them to mat exercises, transferring on and off equipment in PT gym.    Status Achieved           PT Long Term Goals - 07/08/16 1114      PT LONG TERM GOAL #1   Title Pt will be I with more advanced  HEP at end of PT episode    Status On-going     PT LONG TERM GOAL #2   Title Pt will score <40% limited on FOTO to demo functional improvement.    Status Achieved     PT LONG TERM GOAL #3   Title Pt will be able to stand from her bed in the AM with 25-50% less  stiffness in spine.    Baseline depends on the day.    Status Partially Met     PT LONG TERM GOAL #4   Title Pt will be able to report more confidence in her abilty to exercise, travel out of town without injuring her back.    Status Achieved               Plan - 07/10/16 1146    Clinical Impression Statement progressed HEP to incorporate stability strength into functional daily activities such as sit<>stand and bed mobility.    PT Next Visit Plan Pilates Reformer/Tower for strength, manage pain with manual and modalities as needed    PT Home Exercise Plan L stab 1, bridging , piriformis and MET and child pose for rest; qped & seated hip hinge, log roll, single leg bridge   Consulted and Agree with Plan of Care Patient      Patient will benefit from skilled therapeutic intervention in order to improve the following deficits and impairments:     Visit Diagnosis: Right-sided low back pain without sciatica, unspecified chronicity     Problem List Patient Active Problem List   Diagnosis Date Noted  . 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 Anemia, iron deficiency 05/07/2015  . 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 Osteopenia 05/07/2015  . Neurocardiogenic syncope 05/07/2015  . Unable to lose weight 04/24/2014  . Lumbar radiculopathy, chronic 01/03/2014  . Abnormal nuclear stress test 12/15/2013  . Hyperthyroidism 10/06/2011  . Syncope 08/20/2010  . Pacemaker -Biotronik-CLS 08/20/2010  . HYPERTENSION 04/15/2010  . PREMATURE VENTRICULAR CONTRACTIONS 04/15/2010  . GERD 04/15/2010  . Postablative hypothyroidism     Mycal Conde C. Gailya Tauer PT, DPT 07/10/16 12:37 PM   [facility] [address] [place], [place], [address] Phone: [phone]   Fax:  [phone]  Name: SEJAL COFIELD MRN: [id number] Date of Birth: [birth year]

## 2016-07-15 ENCOUNTER — Encounter: Payer: Self-pay | Admitting: Physical Therapy

## 2016-07-15 ENCOUNTER — Ambulatory Visit: Payer: PPO | Admitting: Physical Therapy

## 2016-07-15 DIAGNOSIS — M545 Low back pain, unspecified: Secondary | ICD-10-CM

## 2016-07-15 NOTE — Therapy (Signed)
[place], [place], [address] Phone: [phone]   Fax:  [phone]  Physical Therapy Treatment  Patient Details  Name: Cindy [name] MRN: [id number] Date of Birth: 1945-04-26 Referring Provider: Dr. Mina Marble   Encounter Date: 07/15/2016      PT End of Session - 07/15/16 1012    Visit Number 13   Number of Visits 20   Date for PT Re-Evaluation 07/22/16   PT Start Time 4496   PT Stop Time 1102   PT Time Calculation (min) 47 min   Activity Tolerance Patient tolerated treatment well   Behavior During Therapy [facility] for tasks assessed/performed      Past Medical History:  Diagnosis Date  . Abnormal nuclear stress test 12/15/2013  . Anemia    hx iron def, no GI loss indentifed - resolved with diet change  . Arthritis   . GERD   . GRAVES' DISEASE 2013   I-131 ablation, now post tx hypothyroid state  . HYPERTENSION   . Impaired glucose tolerance test   . Keloid 12/31/2010   incisional   . Pacemaker -Biotronik-CLS 08/20/2010  . Postablative hypothyroidism    Qualifier: Diagnosis of  By: Joya Gaskins CMA, [place]    . Sciatica   . Syncope 08/20/2010   s/p PPM 08/2010 for sxc pause on loop recorder causing same  . Unspecified vitamin D deficiency     Past Surgical History:  Procedure Laterality Date  . APPENDECTOMY    . BREAST BIOPSY Left 2001   guided excisional bx (L) breast microcalcification at 12 o'clock- Benign  . BUNIONECTOMY Left   . CATARACT EXTRACTION  2014  . LEFT HEART CATHETERIZATION WITH CORONARY ANGIOGRAM N/A 12/22/2013   Procedure: LEFT HEART CATHETERIZATION WITH CORONARY ANGIOGRAM;  Surgeon: Sinclair Grooms, MD;  Location: [facility] CATH LAB;  Service: Cardiovascular;  Laterality: N/A;  . PACEMAKER INSERTION    . Right ankle  06/2010   Trimalleolar fracture    There were no vitals filed for this visit.      Subjective Assessment - 07/15/16 1024    Subjective I have been working on the hip  hinge and it helps me first thing in the AM.    Currently in Pain? Yes   Pain Score 2    Pain Location Back   Pain Orientation Lower   Pain Descriptors / Indicators Sore   Pain Type Chronic pain   Pain Onset More than a month ago   Pain Frequency Intermittent            [facility] Adult PT Treatment/Exercise - 07/15/16 0001      Lumbar Exercises: Machines for Strengthening   Other Lumbar Machine Exercise Pilates Reformer: see note       Pilates Reformer used for LE/core strength, postural strength, lumbopelvic disassociation and core control.  Exercises included:  Footwork 2 Red 1 blue parallel: heels and forefoot, adducted.  Descending ankle work (heel raise and lower for stretch)  Used ball under sacrum for core challenge for single leg work 2 Red   Supine Arms with ball under sacrum 1 red spring legs in table top X 10   Feet in Straps 1 Red 1 Yellow Arcs with parallel and turnout used ball between knees to improve deep core contraction , Circles in each direction.    Scooter Red spring x 10 added arms off for 2-3 reps but unable to do in proper hinge position and maintain balance  Ant hip  stretch each side 30 sec x 3  Seated piriformis stretch             PT Education - 07/15/16 1029    Education provided Yes   Education Details reinforced PIlates principles    Person(s) Educated Patient   Methods Explanation   Comprehension Verbalized understanding          PT Short Term Goals - 06/24/16 0938      PT SHORT TERM GOAL #1   Title Pt will be I with HEP for stabilization and flexibilty.    Status Achieved     PT SHORT TERM GOAL #2   Title Pt will understand and demo body mechanics, Pilates principles and apply them to mat exercises, transferring on and off equipment in PT gym.    Status Achieved           PT Long Term Goals - 07/08/16 1114      PT LONG TERM GOAL #1   Title Pt will be I with more advanced  HEP at end of PT episode    Status On-going      PT LONG TERM GOAL #2   Title Pt will score <40% limited on FOTO to demo functional improvement.    Status Achieved     PT LONG TERM GOAL #3   Title Pt will be able to stand from her bed in the AM with 25-50% less stiffness in spine.    Baseline depends on the day.    Status Partially Met     PT LONG TERM GOAL #4   Title Pt will be able to report more confidence in her abilty to exercise, travel out of town without injuring her back.    Status Achieved               Plan - 07/15/16 1018    Clinical Impression Statement No pain increase with Reformer exercises.  Back fatigued and she has min difficulty with sensing level pelvis in standing (Scooter).  Finishing up this week with plan to continue group Pilates Mat Class.    PT Next Visit Plan FOTO?  DC. Answer any questions she has about HEP, hip hinge, etc.    PT Home Exercise Plan L stab 1, bridging , piriformis and MET and child pose for rest; qped & seated hip hinge, log roll, single leg bridge   Consulted and Agree with Plan of Care Patient      Patient will benefit from skilled therapeutic intervention in order to improve the following deficits and impairments:  Decreased mobility, Hypomobility, Impaired flexibility, Improper body mechanics, Pain, Increased fascial restricitons, Decreased range of motion, Decreased strength  Visit Diagnosis: Right-sided low back pain without sciatica, unspecified chronicity     Problem List Patient Active Problem List   Diagnosis Date Noted  . 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 Anemia, iron deficiency 05/07/2015  . 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 Osteopenia 05/07/2015  . Neurocardiogenic syncope 05/07/2015  . Unable to lose weight 04/24/2014  . Lumbar radiculopathy, chronic 01/03/2014  . Abnormal nuclear stress test 12/15/2013  . Hyperthyroidism 10/06/2011  . Syncope 08/20/2010  . Pacemaker -Biotronik-CLS 08/20/2010  . HYPERTENSION 04/15/2010  . PREMATURE VENTRICULAR  CONTRACTIONS 04/15/2010  . GERD 04/15/2010  . Postablative hypothyroidism     Mary Hockey 07/15/2016, 11:21 AM  [facility] [address] [place], [place], [address] Phone: [phone]   Fax:  [phone]  Name: Cindy [name] MRN: [id number] Date of Birth:  Aug 11, 1945   Raeford Razor, PT 07/15/16 11:22 AM Phone: [phone] Fax: [phone]

## 2016-07-16 DIAGNOSIS — J301 Allergic rhinitis due to pollen: Secondary | ICD-10-CM | POA: Diagnosis not present

## 2016-07-17 ENCOUNTER — Encounter: Payer: Self-pay | Admitting: Physical Therapy

## 2016-07-17 ENCOUNTER — Ambulatory Visit: Payer: PPO | Admitting: Physical Therapy

## 2016-07-17 DIAGNOSIS — M545 Low back pain, unspecified: Secondary | ICD-10-CM

## 2016-07-17 NOTE — Therapy (Addendum)
[place], [place], [address] Phone: [phone]   Fax:  [phone]  Physical Therapy Treatment and Discharge   Patient Details  Name: Cindy [name] MRN: [id number] Date of Birth: [birth year] Referring Provider: Dr. Mina Marble   Encounter Date: 07/17/2016      PT End of Session - 07/17/16 1011    Visit Number 14   Number of Visits 20   Date for PT Re-Evaluation 07/22/16   PT Start Time 8938   PT Stop Time 1041   PT Time Calculation (min) 26 min   Activity Tolerance Patient tolerated treatment well   Behavior During Therapy [facility] for tasks assessed/performed      Past Medical History:  Diagnosis Date  . Abnormal nuclear stress test 12/15/2013  . Anemia    hx iron def, no GI loss indentifed - resolved with diet change  . Arthritis   . GERD   . GRAVES' DISEASE 2013   I-131 ablation, now post tx hypothyroid state  . HYPERTENSION   . Impaired glucose tolerance test   . Keloid 12/31/2010   incisional   . Pacemaker -Biotronik-CLS 08/20/2010  . Postablative hypothyroidism    Qualifier: Diagnosis of  By: Joya Gaskins CMA, [place]    . Sciatica   . Syncope 08/20/2010   s/p PPM 08/2010 for sxc pause on loop recorder causing same  . Unspecified vitamin D deficiency     Past Surgical History:  Procedure Laterality Date  . APPENDECTOMY    . BREAST BIOPSY Left 2001   guided excisional bx (L) breast microcalcification at 12 o'clock- Benign  . BUNIONECTOMY Left   . CATARACT EXTRACTION  2014  . LEFT HEART CATHETERIZATION WITH CORONARY ANGIOGRAM N/A 12/22/2013   Procedure: LEFT HEART CATHETERIZATION WITH CORONARY ANGIOGRAM;  Surgeon: Sinclair Grooms, MD;  Location: [facility] CATH LAB;  Service: Cardiovascular;  Laterality: N/A;  . PACEMAKER INSERTION    . Right ankle  06/2010   Trimalleolar fracture    There were no vitals filed for this visit.      Subjective Assessment - 07/17/16 1018    Subjective Has developed a  strategy to get out of bed in the morning so she is able to get out of bed in the morning. Is prepared to progress to mat pilates classes and yoga. Reports feeling that she has made good improvements that she is able to keep up with. Verbalized soreness in bilateral glut meds after increasing hike distance.    Patient Stated Goals Pt would like to be able to travel without limitation of pain. Have less fear.    Currently in Pain? Yes   Pain Score 1    Pain Location Back   Pain Orientation Lower   Pain Descriptors / Indicators Sore   Aggravating Factors  just the usual pain that does not go away   Pain Relieving Factors good posture, hip hinge            [facility] PT Assessment - 07/17/16 0001      AROM   Lumbar Flexion 80   Lumbar Extension 30  no pain   Lumbar - Right Side Bend 20   Lumbar - Left Side Bend 20     Strength   Right Hip Extension 4+/5   Right Hip ABduction 5/5   Left Hip Extension 4+/5   Left Hip ABduction 5/5     Palpation   Palpation comment just some soreness in superior gluts, TTP bilateral  ITB.                   PT Education - 07/22/16 1043    Education provided Yes   Education Details progress with strength and goals, beginning yoga and pilates classes, daily activities with postural awareness, HEP   Person(s) Educated Patient   Methods Explanation   Comprehension Verbalized understanding          PT Short Term Goals - 06/24/16 5284      PT SHORT TERM GOAL #1   Title Pt will be I with HEP for stabilization and flexibilty.    Status Achieved     PT SHORT TERM GOAL #2   Title Pt will understand and demo body mechanics, Pilates principles and apply them to mat exercises, transferring on and off equipment in PT gym.    Status Achieved           PT Long Term Goals - 22-Jul-2016 1022      PT LONG TERM GOAL #1   Title Pt will be I with more advanced  HEP at end of PT episode    Baseline independent and progressing to mat pilates class    Status Achieved     PT LONG TERM GOAL #2   Title Pt will score <40% limited on FOTO to demo functional improvement.    Baseline 25%   Status Achieved     PT LONG TERM GOAL #3   Title Pt will be able to stand from her bed in the AM with 25-50% less stiffness in spine.    Baseline able   Status Achieved     PT LONG TERM GOAL #4   Title Pt will be able to report more confidence in her abilty to exercise, travel out of town without injuring her back.    Baseline verbalizes confidence   Status Achieved               Plan - 2016-07-22 1044    Clinical Impression Statement Pt has made significant improvements in strength, mobility and functional ability. She has met all goals and will be d/c to independent program. Pt verbalized comfort and understanding of HEP and was instructed to contact us with any further questions.    Consulted and Agree with Plan of Care Patient      Patient will benefit from skilled therapeutic intervention in order to improve the following deficits and impairments:     Visit Diagnosis: Right-sided low back pain without sciatica, unspecified chronicity       G-Codes - 2016-07-22 1048    Functional Assessment Tool Used (Outpatient Only) FOTO   Functional Limitation Mobility: Walking and moving around   Mobility: Walking and Moving Around Goal Status [phone]) At least 20 percent but less than 40 percent impaired, limited or restricted   Mobility: Walking and Moving Around Discharge Status [phone]) At least 20 percent but less than 40 percent impaired, limited or restricted      Problem List Patient Active Problem List   Diagnosis Date Noted  . 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 Anemia, iron deficiency 05/07/2015  . 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 Osteopenia 05/07/2015  . Neurocardiogenic syncope 05/07/2015  . Unable to lose weight 04/24/2014  . Lumbar radiculopathy, chronic 01/03/2014  . Abnormal nuclear stress test 12/15/2013  . Hyperthyroidism  10/06/2011  . Syncope 08/20/2010  . Pacemaker -Biotronik-CLS 08/20/2010  . HYPERTENSION 04/15/2010  . PREMATURE VENTRICULAR CONTRACTIONS 04/15/2010  . GERD 04/15/2010  . Postablative hypothyroidism  PHYSICAL THERAPY DISCHARGE SUMMARY  Visits from Start of Care: 14  Current functional level related to goals / functional outcomes: See above   Remaining deficits: See above   Education / Equipment: Anatomy of condition, POC, HEP, exercise form/rationale  Plan: Patient agrees to discharge.  Patient goals were met. Patient is being discharged due to meeting the stated rehab goals.  ?????       Thorvald Orsino C. Aisha Greenberger PT, DPT 07/17/16 10:49 AM   [facility] [address] [place], [place], [address] Phone: [phone]   Fax:  [phone]  Name: JOLANDA MCCANN MRN: [id number] Date of Birth: 1945-06-01

## 2016-07-21 NOTE — Progress Notes (Signed)
Electrophysiology Office Note Date: 07/24/2016  ID:  Cindy [name], DOB [birth year], MRN [id number]  PCP: Leeroy Cha, MD Electrophysiologist: Caryl Comes  CC: Pacemaker follow-up   Cindy [name] is a 71 y.o. female seen today for Dr Caryl Comes.  She presents today for routine electrophysiology followup.  Since last being seen in our clinic, the patient reports doing very well. She continues to struggle with back pain and is now using a TENS unit with some relief. She denies chest pain, palpitations, PND, orthopnea, nausea, vomiting, dizziness, syncope, edema, weight gain, or early satiety. She has had some tenderness around the device pocket itself.   Cath 2015 demonstrated normal coronary arteries with normal LV function   Device History: Biotronik dual chamber PPM implanted 2012 for vasovagal syncope   Past Medical History:  Diagnosis Date  . Abnormal nuclear stress test 12/15/2013  . Anemia    hx iron def, no GI loss indentifed - resolved with diet change  . Arthritis   . GERD   . GRAVES' DISEASE 2013   I-131 ablation, now post tx hypothyroid state  . HYPERTENSION   . Impaired glucose tolerance test   . Keloid 12/31/2010   incisional   . Pacemaker -Biotronik-CLS 08/20/2010  . Postablative hypothyroidism    Qualifier: Diagnosis of  By: Joya Gaskins CMA, [place]    . Sciatica   . Syncope 08/20/2010   s/p PPM 08/2010 for sxc pause on loop recorder causing same  . Unspecified vitamin D deficiency    Past Surgical History:  Procedure Laterality Date  . APPENDECTOMY    . BREAST BIOPSY Left 2001   guided excisional bx (L) breast microcalcification at 12 o'clock- Benign  . BUNIONECTOMY Left   . CATARACT EXTRACTION  2014  . LEFT HEART CATHETERIZATION WITH CORONARY ANGIOGRAM N/A 12/22/2013   Procedure: LEFT HEART CATHETERIZATION WITH CORONARY ANGIOGRAM;  Surgeon: Sinclair Grooms, MD;  Location: [facility] CATH LAB;  Service: Cardiovascular;  Laterality: N/A;  . PACEMAKER INSERTION      . Right ankle  06/2010   Trimalleolar fracture    Current Outpatient Prescriptions  Medication Sig Dispense Refill  . aspirin 81 MG tablet Take 81 mg by mouth daily.      . cholecalciferol (VITAMIN D) 1000 UNITS tablet Take 1,000 Units by mouth daily. Reported on 06/20/2015    . diazepam (VALIUM) 5 MG tablet Take 5 mg by mouth every 6 (six) hours as needed for anxiety.    Marland Kitchen levothyroxine (SYNTHROID, LEVOTHROID) 112 MCG tablet Take 112 mcg by mouth daily before breakfast.    . lisinopril (PRINIVIL,ZESTRIL) 20 MG tablet TAKE 1 TABLET (20 MG TOTAL) BY MOUTH DAILY. 90 tablet 1  . loratadine (CLARITIN) 10 MG tablet Take 10 mg by mouth daily as needed for allergies. Reported on 06/20/2015    . methocarbamol (ROBAXIN) 500 MG tablet Take 500 mg by mouth 4 (four) times daily.    . Multiple Vitamins-Minerals (MULTIVITAMIN WITH MINERALS) tablet Take 1 tablet by mouth daily.    . naproxen (NAPROSYN) 250 MG tablet Take 250 mg by mouth daily as needed (pain).     . triamterene-hydrochlorothiazide (DYAZIDE) 37.5-25 MG per capsule Take 1 each (1 capsule total) by mouth daily. 90 capsule 1   No current facility-administered medications for this visit.     Allergies:   Codeine   Social History: Social History   Social History  . Marital status: Married    Spouse name: N/A  . Number of children: N/A  .  Years of education: N/A   Occupational History  . Not on file.   Social History Main Topics  . Smoking status: Former Smoker    Types: Cigarettes    Quit date: 04/08/1967  . Smokeless tobacco: Never Used  . Alcohol use Yes     Comment: OCCASIONAL  . Drug use: No  . Sexual activity: Not on file   Other Topics Concern  . Not on file   Social History Narrative   ** Merged History Encounter **        Family History: Family History  Problem Relation Age of Onset  . Rheum arthritis Mother   . Emphysema Mother   . Hypertension Mother   . Osteoporosis Mother   . COPD Mother   . Prostate  cancer Father   . Hypertension Father   . Heart attack Father   . Osteoporosis Maternal Grandmother   . Stroke Other      Review of Systems: All other systems reviewed and are otherwise negative except as noted above.   Physical Exam: VS:  BP 120/80   Pulse 82   Ht 5\' 2"  (1.575 m)   Wt 156 lb (70.8 kg)   SpO2 98%   BMI 28.53 kg/m  , BMI Body mass index is 28.53 kg/m.  GEN- The patient is well appearing, alert and oriented x 3 today.   HEENT: normocephalic, atraumatic; sclera clear, conjunctiva pink; hearing intact; oropharynx clear; neck supple  Lungs- Clear to ausculation bilaterally, normal work of breathing.  No wheezes, rales, rhonchi Heart- Regular rate and rhythm, no murmurs, rubs or gallops  GI- soft, non-tender, non-distended, bowel sounds present  Extremities- no clubbing, cyanosis, or edema; DP/PT/radial pulses 2+ bilaterally MS- no significant deformity or atrophy Skin- warm and dry, no rash or lesion; PPM pocket well healed, no tethering Psych- euthymic mood, full affect Neuro- strength and sensation are intact  PPM Interrogation- reviewed in detail today,  See PACEART report  EKG is not ordered today  Recent Labs: 10/05/2015: Hemoglobin 11.0; Platelets 344   Wt Readings from Last 3 Encounters:  07/24/16 156 lb (70.8 kg)  10/05/15 152 lb (68.9 kg)  12/14/14 149 lb 9.6 oz (67.9 kg)     Other studies Reviewed: Additional studies/ records that were reviewed today include: Dr Olin Pia office notes  Assessment and Plan:  1.  Vasovagal syncope Normal PPM function See Pace Art report No changes today  2.  Atrial tachycardia Burden low and relatively asymptomatic Will follow  Current medicines are reviewed at length with the patient today.   The patient does not have concerns regarding her medicines.  The following changes were made today:  none  Labs/ tests ordered today include: none  Orders Placed This Encounter  Procedures  . CUP PACEART  INCLINIC DEVICE CHECK     Disposition:   Follow up with device clinic in 6 months and Dr Caryl Comes 1 year    Signed, Chanetta Marshall, NP 07/24/2016 8:48 AM  [place] [address] [place] [address] [phone] (office) [phone] (fax)

## 2016-07-24 ENCOUNTER — Ambulatory Visit (INDEPENDENT_AMBULATORY_CARE_PROVIDER_SITE_OTHER): Payer: PPO | Admitting: Nurse Practitioner

## 2016-07-24 ENCOUNTER — Encounter: Payer: Self-pay | Admitting: Nurse Practitioner

## 2016-07-24 ENCOUNTER — Other Ambulatory Visit (HOSPITAL_COMMUNITY)
Admission: RE | Admit: 2016-07-24 | Discharge: 2016-07-24 | Disposition: A | Discharge status: Home / Self Care | Payer: PPO | Source: Ambulatory Visit | Attending: Internal Medicine | Admitting: Internal Medicine

## 2016-07-24 ENCOUNTER — Other Ambulatory Visit: Payer: Self-pay | Admitting: Internal Medicine

## 2016-07-24 VITALS — BP 120/80 | HR 82 | Ht 62.0 in | Wt 156.0 lb

## 2016-07-24 DIAGNOSIS — Z01419 Encounter for gynecological examination (general) (routine) without abnormal findings: Secondary | ICD-10-CM | POA: Insufficient documentation

## 2016-07-24 DIAGNOSIS — D631 Anemia in chronic kidney disease: Secondary | ICD-10-CM | POA: Diagnosis not present

## 2016-07-24 DIAGNOSIS — Z1389 Encounter for screening for other disorder: Secondary | ICD-10-CM | POA: Diagnosis not present

## 2016-07-24 DIAGNOSIS — D509 Iron deficiency anemia, unspecified: Secondary | ICD-10-CM | POA: Diagnosis not present

## 2016-07-24 DIAGNOSIS — M8588 Other specified disorders of bone density and structure, other site: Secondary | ICD-10-CM | POA: Diagnosis not present

## 2016-07-24 DIAGNOSIS — E890 Postprocedural hypothyroidism: Secondary | ICD-10-CM | POA: Diagnosis not present

## 2016-07-24 DIAGNOSIS — N183 Chronic kidney disease, stage 3 (moderate): Secondary | ICD-10-CM | POA: Diagnosis not present

## 2016-07-24 DIAGNOSIS — R55 Syncope and collapse: Secondary | ICD-10-CM

## 2016-07-24 DIAGNOSIS — Z1211 Encounter for screening for malignant neoplasm of colon: Secondary | ICD-10-CM | POA: Diagnosis not present

## 2016-07-24 DIAGNOSIS — I4430 Unspecified atrioventricular block: Secondary | ICD-10-CM | POA: Diagnosis not present

## 2016-07-24 DIAGNOSIS — Z113 Encounter for screening for infections with a predominantly sexual mode of transmission: Secondary | ICD-10-CM | POA: Insufficient documentation

## 2016-07-24 DIAGNOSIS — Z1159 Encounter for screening for other viral diseases: Secondary | ICD-10-CM | POA: Diagnosis not present

## 2016-07-24 DIAGNOSIS — I10 Essential (primary) hypertension: Secondary | ICD-10-CM | POA: Diagnosis not present

## 2016-07-24 DIAGNOSIS — E0500 Thyrotoxicosis with diffuse goiter without thyrotoxic crisis or storm: Secondary | ICD-10-CM | POA: Diagnosis not present

## 2016-07-24 DIAGNOSIS — Z1231 Encounter for screening mammogram for malignant neoplasm of breast: Secondary | ICD-10-CM | POA: Diagnosis not present

## 2016-07-24 DIAGNOSIS — E785 Hyperlipidemia, unspecified: Secondary | ICD-10-CM | POA: Diagnosis not present

## 2016-07-24 DIAGNOSIS — Z0000 Encounter for general adult medical examination without abnormal findings: Secondary | ICD-10-CM | POA: Diagnosis not present

## 2016-07-24 LAB — CUP PACEART INCLINIC DEVICE CHECK
Date Time Interrogation Session: 20180419083723
Implantable Lead Implant Date: 20120606
Implantable Lead Location: 753859
Implantable Lead Model: 5076
Implantable Lead Model: 5076
MDC IDC LEAD IMPLANT DT: 20120606
MDC IDC LEAD LOCATION: 753860
MDC IDC PG IMPLANT DT: 20120606
Pulse Gen Serial Number: 66089338

## 2016-07-24 NOTE — Patient Instructions (Addendum)
  Medication Instructions:  None Ordered   Labwork: None Ordered   Testing/Procedures: None Ordered   Follow-Up: Your physician recommends that you schedule a follow-up appointment in: 6 months with the device clinic.   Your physician wants you to follow-up in: 1 year with Dr. Caryl Comes. You will receive a reminder letter in the mail two months in advance. If you don't receive a letter, please call our office to schedule the follow-up appointment.   Any Other Special Instructions Will Be Listed Below (If Applicable).     If you need a refill on your cardiac medications before your next appointment, please call your pharmacy.

## 2016-07-25 DIAGNOSIS — H5203 Hypermetropia, bilateral: Secondary | ICD-10-CM | POA: Diagnosis not present

## 2016-07-28 LAB — CYTOLOGY - PAP
Chlamydia: NEGATIVE
Diagnosis: NEGATIVE

## 2016-11-28 DIAGNOSIS — F329 Major depressive disorder, single episode, unspecified: Secondary | ICD-10-CM | POA: Diagnosis not present

## 2016-11-28 DIAGNOSIS — M791 Myalgia: Secondary | ICD-10-CM | POA: Diagnosis not present

## 2016-11-28 DIAGNOSIS — I10 Essential (primary) hypertension: Secondary | ICD-10-CM | POA: Diagnosis not present

## 2016-11-28 DIAGNOSIS — G894 Chronic pain syndrome: Secondary | ICD-10-CM | POA: Diagnosis not present

## 2016-11-28 DIAGNOSIS — N183 Chronic kidney disease, stage 3 (moderate): Secondary | ICD-10-CM | POA: Diagnosis not present

## 2016-12-02 DIAGNOSIS — F4323 Adjustment disorder with mixed anxiety and depressed mood: Secondary | ICD-10-CM | POA: Diagnosis not present

## 2016-12-09 DIAGNOSIS — F4323 Adjustment disorder with mixed anxiety and depressed mood: Secondary | ICD-10-CM | POA: Diagnosis not present

## 2016-12-12 DIAGNOSIS — M5416 Radiculopathy, lumbar region: Secondary | ICD-10-CM | POA: Diagnosis not present

## 2016-12-23 DIAGNOSIS — F4323 Adjustment disorder with mixed anxiety and depressed mood: Secondary | ICD-10-CM | POA: Diagnosis not present

## 2016-12-25 DIAGNOSIS — M5416 Radiculopathy, lumbar region: Secondary | ICD-10-CM | POA: Diagnosis not present

## 2017-01-06 DIAGNOSIS — F4323 Adjustment disorder with mixed anxiety and depressed mood: Secondary | ICD-10-CM | POA: Diagnosis not present

## 2017-01-20 DIAGNOSIS — F4323 Adjustment disorder with mixed anxiety and depressed mood: Secondary | ICD-10-CM | POA: Diagnosis not present

## 2017-01-26 ENCOUNTER — Ambulatory Visit (INDEPENDENT_AMBULATORY_CARE_PROVIDER_SITE_OTHER): Payer: PPO | Admitting: *Deleted

## 2017-01-26 DIAGNOSIS — N183 Chronic kidney disease, stage 3 (moderate): Secondary | ICD-10-CM | POA: Diagnosis not present

## 2017-01-26 DIAGNOSIS — R55 Syncope and collapse: Secondary | ICD-10-CM

## 2017-01-26 DIAGNOSIS — T7840XD Allergy, unspecified, subsequent encounter: Secondary | ICD-10-CM | POA: Diagnosis not present

## 2017-01-26 DIAGNOSIS — G894 Chronic pain syndrome: Secondary | ICD-10-CM | POA: Diagnosis not present

## 2017-01-26 DIAGNOSIS — T783XXS Angioneurotic edema, sequela: Secondary | ICD-10-CM | POA: Diagnosis not present

## 2017-01-26 DIAGNOSIS — I10 Essential (primary) hypertension: Secondary | ICD-10-CM | POA: Diagnosis not present

## 2017-01-26 NOTE — Progress Notes (Signed)
Pacemaker check in clinic. Normal device function. Thresholds, sensing, impedances consistent with previous measurements. Device programmed to maximize longevity. 20 mode switches - EGMs show atach. 1 high ventricular rate noted - EGM shows SVT. Device programmed at appropriate safety margins. Histogram distribution appropriate for patient activity level. Device programmed to optimize intrinsic conduction. Estimated longevity 60%. ROV with SK recall 07/2017

## 2017-02-03 DIAGNOSIS — F4323 Adjustment disorder with mixed anxiety and depressed mood: Secondary | ICD-10-CM | POA: Diagnosis not present

## 2017-02-13 DIAGNOSIS — E890 Postprocedural hypothyroidism: Secondary | ICD-10-CM | POA: Diagnosis not present

## 2017-02-17 DIAGNOSIS — F4323 Adjustment disorder with mixed anxiety and depressed mood: Secondary | ICD-10-CM | POA: Diagnosis not present

## 2017-02-20 DIAGNOSIS — E890 Postprocedural hypothyroidism: Secondary | ICD-10-CM | POA: Diagnosis not present

## 2017-02-20 DIAGNOSIS — R7301 Impaired fasting glucose: Secondary | ICD-10-CM | POA: Diagnosis not present

## 2017-02-20 DIAGNOSIS — I10 Essential (primary) hypertension: Secondary | ICD-10-CM | POA: Diagnosis not present

## 2017-03-03 DIAGNOSIS — F4323 Adjustment disorder with mixed anxiety and depressed mood: Secondary | ICD-10-CM | POA: Diagnosis not present

## 2017-03-23 DIAGNOSIS — F4323 Adjustment disorder with mixed anxiety and depressed mood: Secondary | ICD-10-CM | POA: Diagnosis not present

## 2017-04-03 DIAGNOSIS — R7301 Impaired fasting glucose: Secondary | ICD-10-CM | POA: Diagnosis not present

## 2017-04-03 DIAGNOSIS — E890 Postprocedural hypothyroidism: Secondary | ICD-10-CM | POA: Diagnosis not present

## 2017-04-14 DIAGNOSIS — F4323 Adjustment disorder with mixed anxiety and depressed mood: Secondary | ICD-10-CM | POA: Diagnosis not present

## 2017-04-17 DIAGNOSIS — R6889 Other general symptoms and signs: Secondary | ICD-10-CM | POA: Diagnosis not present

## 2017-04-23 DIAGNOSIS — M791 Myalgia, unspecified site: Secondary | ICD-10-CM | POA: Diagnosis not present

## 2017-04-23 DIAGNOSIS — I10 Essential (primary) hypertension: Secondary | ICD-10-CM | POA: Diagnosis not present

## 2017-04-23 DIAGNOSIS — N189 Chronic kidney disease, unspecified: Secondary | ICD-10-CM | POA: Diagnosis not present

## 2017-04-23 DIAGNOSIS — R7301 Impaired fasting glucose: Secondary | ICD-10-CM | POA: Diagnosis not present

## 2017-04-23 DIAGNOSIS — E890 Postprocedural hypothyroidism: Secondary | ICD-10-CM | POA: Diagnosis not present

## 2017-05-05 DIAGNOSIS — F4323 Adjustment disorder with mixed anxiety and depressed mood: Secondary | ICD-10-CM | POA: Diagnosis not present

## 2017-05-06 DIAGNOSIS — M1990 Unspecified osteoarthritis, unspecified site: Secondary | ICD-10-CM | POA: Diagnosis not present

## 2017-05-06 DIAGNOSIS — M19042 Primary osteoarthritis, left hand: Secondary | ICD-10-CM | POA: Diagnosis not present

## 2017-05-06 DIAGNOSIS — M79641 Pain in right hand: Secondary | ICD-10-CM | POA: Diagnosis not present

## 2017-05-06 DIAGNOSIS — M25562 Pain in left knee: Secondary | ICD-10-CM | POA: Diagnosis not present

## 2017-05-06 DIAGNOSIS — M25511 Pain in right shoulder: Secondary | ICD-10-CM | POA: Diagnosis not present

## 2017-05-06 DIAGNOSIS — M25512 Pain in left shoulder: Secondary | ICD-10-CM | POA: Diagnosis not present

## 2017-05-06 DIAGNOSIS — M353 Polymyalgia rheumatica: Secondary | ICD-10-CM | POA: Diagnosis not present

## 2017-05-06 DIAGNOSIS — M797 Fibromyalgia: Secondary | ICD-10-CM | POA: Diagnosis not present

## 2017-05-06 DIAGNOSIS — M19041 Primary osteoarthritis, right hand: Secondary | ICD-10-CM | POA: Diagnosis not present

## 2017-05-06 DIAGNOSIS — M25569 Pain in unspecified knee: Secondary | ICD-10-CM | POA: Diagnosis not present

## 2017-05-06 DIAGNOSIS — M791 Myalgia, unspecified site: Secondary | ICD-10-CM | POA: Diagnosis not present

## 2017-05-06 DIAGNOSIS — M19011 Primary osteoarthritis, right shoulder: Secondary | ICD-10-CM | POA: Diagnosis not present

## 2017-05-06 DIAGNOSIS — M19012 Primary osteoarthritis, left shoulder: Secondary | ICD-10-CM | POA: Diagnosis not present

## 2017-05-06 DIAGNOSIS — E7800 Pure hypercholesterolemia, unspecified: Secondary | ICD-10-CM | POA: Diagnosis not present

## 2017-05-06 DIAGNOSIS — M79642 Pain in left hand: Secondary | ICD-10-CM | POA: Diagnosis not present

## 2017-05-06 DIAGNOSIS — M25561 Pain in right knee: Secondary | ICD-10-CM | POA: Diagnosis not present

## 2017-05-13 ENCOUNTER — Encounter: Payer: Self-pay | Admitting: Nurse Practitioner

## 2017-05-13 NOTE — Telephone Encounter (Signed)
Spoke with patient. Patient states her endocrinologist switch her from lisinopril to cardizem last month. Informed patient to follow up with her endocrinologist since they made the changes. Patient verbalized understanding and thanked me for the call.

## 2017-05-14 ENCOUNTER — Other Ambulatory Visit: Payer: Self-pay

## 2017-05-14 ENCOUNTER — Encounter (HOSPITAL_BASED_OUTPATIENT_CLINIC_OR_DEPARTMENT_OTHER): Payer: Self-pay | Admitting: Emergency Medicine

## 2017-05-14 DIAGNOSIS — Z7982 Long term (current) use of aspirin: Secondary | ICD-10-CM | POA: Diagnosis not present

## 2017-05-14 DIAGNOSIS — R0602 Shortness of breath: Secondary | ICD-10-CM | POA: Diagnosis not present

## 2017-05-14 DIAGNOSIS — Z87891 Personal history of nicotine dependence: Secondary | ICD-10-CM | POA: Diagnosis not present

## 2017-05-14 DIAGNOSIS — R600 Localized edema: Secondary | ICD-10-CM | POA: Diagnosis present

## 2017-05-14 DIAGNOSIS — Z950 Presence of cardiac pacemaker: Secondary | ICD-10-CM | POA: Diagnosis not present

## 2017-05-14 DIAGNOSIS — I10 Essential (primary) hypertension: Secondary | ICD-10-CM | POA: Diagnosis not present

## 2017-05-14 DIAGNOSIS — Z79899 Other long term (current) drug therapy: Secondary | ICD-10-CM | POA: Insufficient documentation

## 2017-05-14 DIAGNOSIS — E039 Hypothyroidism, unspecified: Secondary | ICD-10-CM | POA: Diagnosis not present

## 2017-05-14 NOTE — ED Triage Notes (Signed)
PT presents with c/o swelling in legs and hypertension for a couple days.

## 2017-05-15 ENCOUNTER — Emergency Department (HOSPITAL_BASED_OUTPATIENT_CLINIC_OR_DEPARTMENT_OTHER): Payer: PPO

## 2017-05-15 ENCOUNTER — Emergency Department (HOSPITAL_BASED_OUTPATIENT_CLINIC_OR_DEPARTMENT_OTHER)
Admission: EM | Admit: 2017-05-15 | Discharge: 2017-05-15 | Disposition: A | Discharge status: Home / Self Care | Payer: PPO | Attending: Emergency Medicine | Admitting: Emergency Medicine

## 2017-05-15 DIAGNOSIS — I10 Essential (primary) hypertension: Secondary | ICD-10-CM | POA: Diagnosis not present

## 2017-05-15 LAB — BRAIN NATRIURETIC PEPTIDE: B NATRIURETIC PEPTIDE 5: 74.9 pg/mL (ref 0.0–100.0)

## 2017-05-15 LAB — BASIC METABOLIC PANEL
Anion gap: 8 (ref 5–15)
BUN: 30 mg/dL — ABNORMAL HIGH (ref 6–20)
CHLORIDE: 107 mmol/L (ref 101–111)
CO2: 24 mmol/L (ref 22–32)
CREATININE: 0.87 mg/dL (ref 0.44–1.00)
Calcium: 8.9 mg/dL (ref 8.9–10.3)
GFR calc non Af Amer: >60 mL/min (ref >60)
GLUCOSE: 95 mg/dL (ref 65–99)
Potassium: 3.6 mmol/L (ref 3.5–5.1)
Sodium: 139 mmol/L (ref 135–145)

## 2017-05-15 LAB — CBC WITH DIFFERENTIAL/PLATELET
BASOS PCT: 1 %
Basophils Absolute: 0.1 10*3/uL (ref 0.0–0.1)
Eosinophils Absolute: 0.1 10*3/uL (ref 0.0–0.7)
Eosinophils Relative: 2 %
HEMATOCRIT: 28.4 % — AB (ref 36.0–46.0)
HEMOGLOBIN: 9.1 g/dL — AB (ref 12.0–15.0)
LYMPHS ABS: 1.9 10*3/uL (ref 0.7–4.0)
LYMPHS PCT: 38 %
MCH: 29.1 pg (ref 26.0–34.0)
MCHC: 32 g/dL (ref 30.0–36.0)
MCV: 90.7 fL (ref 78.0–100.0)
Monocytes Absolute: 0.4 10*3/uL (ref 0.1–1.0)
Monocytes Relative: 8 %
NEUTROS ABS: 2.5 10*3/uL (ref 1.7–7.7)
NEUTROS PCT: 51 %
Platelets: 366 10*3/uL (ref 150–400)
RBC: 3.13 MIL/uL — ABNORMAL LOW (ref 3.87–5.11)
RDW: 12.6 % (ref 11.5–15.5)
WBC: 5 10*3/uL (ref 4.0–10.5)

## 2017-05-15 LAB — URINALYSIS, ROUTINE W REFLEX MICROSCOPIC
Bilirubin Urine: NEGATIVE
GLUCOSE, UA: NEGATIVE mg/dL
Hgb urine dipstick: NEGATIVE
Ketones, ur: NEGATIVE mg/dL
LEUKOCYTES UA: NEGATIVE
Nitrite: NEGATIVE
Protein, ur: NEGATIVE mg/dL
Specific Gravity, Urine: >1.03 — ABNORMAL HIGH (ref 1.005–1.030)
pH: 5.5 (ref 5.0–8.0)

## 2017-05-15 LAB — TROPONIN I: Troponin I: <0.03 ng/mL (ref <0.03)

## 2017-05-15 MED ORDER — AMLODIPINE BESYLATE 10 MG PO TABS: 10.0000 mg | ORAL_TABLET | Freq: Every day | ORAL | 0 refills | Status: DC

## 2017-05-15 MED ORDER — AMLODIPINE BESYLATE 5 MG PO TABS
10.0000 mg | ORAL_TABLET | Freq: Once | ORAL | Status: AC
  Administered 2017-05-15: 10 mg via ORAL
  Filled 2017-05-15: qty 2

## 2017-05-15 NOTE — Discharge Instructions (Signed)
I recommend that you continue your diltiazem and take amlodipine daily as well.  Please follow-up closely with your primary care physician to ensure that they want to continue on this medication.  I recommend she wear compression stockings daily and keep your legs elevated above the level of your heart when at rest.  At this time I do not feel you need emergent diuresis.

## 2017-05-15 NOTE — ED Notes (Signed)
States ankles and feet swelling x 1 day went to drug store and checked bp and it was high  Also has ha and tightness in shoulders,  bp meds changed a month ago

## 2017-05-15 NOTE — ED Provider Notes (Signed)
TIME SEEN: 3:47 AM  CHIEF COMPLAINT: Shortness of breath, leg swelling, hypertension  HPI: Patient is a 72 year old female with history of hypertension, syncope secondary to bradycardia status post pacemaker in 2012 who presents to the emergency department with complaints of leg swelling.  States she has noted she has had swollen legs for several days.  No calf pain.  States that she went to the pharmacy to get something to help with this fluid and checked her blood pressure and was in the 563 systolic.  States she is normally in the states she has had unchanged shortness of breath over the past 2 months.  No chest pain or chest discomfort.  Has mild headache currently.  No vision changes.  No numbness, tingling or focal weakness.  States that her doctor recently took her off of lisinopril due to angioedema as well as triamterene/HCTZ secondary to acute kidney injury.  She is now just on diltiazem.  Denies any history of heart failure.    ROS: See HPI Constitutional: no fever  Eyes: no drainage  ENT: no runny nose   Cardiovascular:  no chest pain  Resp: no SOB  GI: no vomiting GU: no dysuria Integumentary: no rash  Allergy: no hives  Musculoskeletal: no leg swelling  Neurological: no slurred speech ROS otherwise negative  PAST MEDICAL HISTORY/PAST SURGICAL HISTORY:  Past Medical History:  Diagnosis Date  . Abnormal nuclear stress test 12/15/2013  . Anemia    hx iron def, no GI loss indentifed - resolved with diet change  . Arthritis   . GERD   . GRAVES' DISEASE 2013   I-131 ablation, now post tx hypothyroid state  . HYPERTENSION   . Impaired glucose tolerance test   . Keloid 12/31/2010   incisional   . Pacemaker -Biotronik-CLS 08/20/2010  . Postablative hypothyroidism    Qualifier: Diagnosis of  By: Joya Gaskins CMA, [place]    . Sciatica   . Syncope 08/20/2010   s/p PPM 08/2010 for sxc pause on loop recorder causing same  . Unspecified vitamin D deficiency     MEDICATIONS:  Prior to  Admission medications   Medication Sig Start Date End Date Taking? Authorizing Provider  aspirin 81 MG tablet Take 81 mg by mouth daily.      [provider]  cholecalciferol (VITAMIN D) 1000 UNITS tablet Take 1,000 Units by mouth daily. Reported on 06/20/2015    [provider]  diazepam (VALIUM) 5 MG tablet Take 5 mg by mouth every 6 (six) hours as needed for anxiety.    [provider]  levothyroxine (SYNTHROID, LEVOTHROID) 112 MCG tablet Take 112 mcg by mouth daily before breakfast.    [provider]  lisinopril (PRINIVIL,ZESTRIL) 20 MG tablet TAKE 1 TABLET (20 MG TOTAL) BY MOUTH DAILY. 08/16/14   Hoyt Koch, MD  loratadine (CLARITIN) 10 MG tablet Take 10 mg by mouth daily as needed for allergies. Reported on 06/20/2015    [provider]  methocarbamol (ROBAXIN) 500 MG tablet Take 500 mg by mouth 4 (four) times daily.    [provider]  Multiple Vitamins-Minerals (MULTIVITAMIN WITH MINERALS) tablet Take 1 tablet by mouth daily.    [provider]  naproxen (NAPROSYN) 250 MG tablet Take 250 mg by mouth daily as needed (pain).     [provider]  triamterene-hydrochlorothiazide (DYAZIDE) 37.5-25 MG per capsule Take 1 each (1 capsule total) by mouth daily. 04/21/14   Hoyt Koch, MD    ALLERGIES:  Allergies  Allergen Reactions  . Codeine Nausea And Vomiting    SOCIAL HISTORY:  Social History   Tobacco Use  . Smoking status: Former Smoker    Types: Cigarettes    Last attempt to quit: 04/08/1967    Years since quitting: 50.1  . Smokeless tobacco: Never Used  Substance Use Topics  . Alcohol use: Yes    Comment: OCCASIONAL    FAMILY HISTORY: Family History  Problem Relation Age of Onset  . Rheum arthritis Mother   . Emphysema Mother   . Hypertension Mother   . Osteoporosis Mother   . COPD Mother   . Prostate cancer Father   . Hypertension Father   . Heart attack Father   . Osteoporosis  Maternal Grandmother   . Stroke Other     EXAM: BP (!) 201/85 (BP Location: Right Arm)   Pulse 68   Temp 98.2 F (36.8 C) (Oral)   Resp 18   SpO2 98%  CONSTITUTIONAL: Alert and oriented and responds appropriately to questions. Well-appearing; well-nourished HEAD: Normocephalic EYES: Conjunctivae clear, pupils appear equal, EOMI ENT: normal nose; moist mucous membranes NECK: Supple, no meningismus, no nuchal rigidity, no LAD  CARD: RRR; S1 and S2 appreciated; no murmurs, no clicks, no rubs, no gallops RESP: Normal chest excursion without splinting or tachypnea; breath sounds clear and equal bilaterally; no wheezes, no rhonchi, no rales, no hypoxia or respiratory distress, speaking full sentences ABD/GI: Normal bowel sounds; non-distended; soft, non-tender, no rebound, no guarding, no peritoneal signs, no hepatosplenomegaly BACK:  The back appears normal and is non-tender to palpation, there is no CVA tenderness EXT: Normal ROM in all joints; non-tender to palpation; no edema; normal capillary refill; no cyanosis, no calf tenderness or swelling    SKIN: Normal color for age and race; warm; no rash NEURO: Moves all extremities equally PSYCH: The patient's mood and manner are appropriate. Grooming and personal hygiene are appropriate.  MEDICAL DECISION MAKING: Patient here with complaints of lower extremity swelling, hypertension.  Currently no sign of significant edema on exam.  Her lungs are clear there is no JVD.  She does not appear volume overloaded.  She is however very hypertensive.  Discussed with patient that leg swelling could be secondary to recently coming off of her diuretics but also because she is now on a calcium channel blocker.  We will also add on amlodipine to help with her blood pressure.  Will check labs, urine to ensure no sign of endorgan damage.  EKG shows no ischemic abnormality.  We will also interrogate her pacemaker.  ED PROGRESS: Patient reports feeling much  better.  Blood pressure has significantly improved to 155/60.  Labs unremarkable other than mild anemia.  No sign of endorgan damage.  Chest x-ray shows cardiomegaly but no edema.  Discussed this finding of cardiomegaly with patient and recommended outpatient follow-up.  He has a PCP for follow-up.  Recommended elevation of her legs at rest and compression stockings for swelling.  Again no significant swelling or edema noted on exam.  Doubt DVT.  No sign of cellulitis.  I feel she is safe to be discharged.  Will add amlodipine 10 mg to her regimen.  She will call her doctor for close follow-up.   At this time, I do not feel there is any life-threatening condition present. I have reviewed and discussed all results (EKG, imaging, lab, urine as appropriate) and exam findings with patient/family. I have reviewed nursing notes and appropriate previous records.  I feel the  patient is safe to be discharged home without further emergent workup and can continue workup as an outpatient as needed. Discussed usual and customary return precautions. Patient/family verbalize understanding and are comfortable with this plan.  Outpatient follow-up has been provided if needed. All questions have been answered.      EKG Interpretation  Date/Time:  Friday May 15 2017 04:27:04 EST Ventricular Rate:  70 PR Interval:    QRS Duration: 65 QT Interval:  410 QTC Calculation: 443 R Axis:   29 Text Interpretation:  Atrial-paced rhythm No significant change since last tracing Confirmed by Everest Hacking, Cyril Mourning [phone]) on 05/15/2017 4:36:18 AM         Benoit Meech, Delice Bison, DO 05/15/17 3112

## 2017-05-19 DIAGNOSIS — R609 Edema, unspecified: Secondary | ICD-10-CM | POA: Diagnosis not present

## 2017-05-19 DIAGNOSIS — E890 Postprocedural hypothyroidism: Secondary | ICD-10-CM | POA: Diagnosis not present

## 2017-05-19 DIAGNOSIS — F419 Anxiety disorder, unspecified: Secondary | ICD-10-CM | POA: Diagnosis not present

## 2017-05-19 DIAGNOSIS — I10 Essential (primary) hypertension: Secondary | ICD-10-CM | POA: Diagnosis not present

## 2017-05-22 DIAGNOSIS — M25519 Pain in unspecified shoulder: Secondary | ICD-10-CM | POA: Diagnosis not present

## 2017-05-22 DIAGNOSIS — M797 Fibromyalgia: Secondary | ICD-10-CM | POA: Diagnosis not present

## 2017-05-22 DIAGNOSIS — M7552 Bursitis of left shoulder: Secondary | ICD-10-CM | POA: Diagnosis not present

## 2017-05-22 DIAGNOSIS — M791 Myalgia, unspecified site: Secondary | ICD-10-CM | POA: Diagnosis not present

## 2017-05-22 DIAGNOSIS — M1990 Unspecified osteoarthritis, unspecified site: Secondary | ICD-10-CM | POA: Diagnosis not present

## 2017-05-26 DIAGNOSIS — F4323 Adjustment disorder with mixed anxiety and depressed mood: Secondary | ICD-10-CM | POA: Diagnosis not present

## 2017-05-27 ENCOUNTER — Ambulatory Visit: Payer: PPO | Admitting: Internal Medicine

## 2017-05-27 VITALS — BP 182/76 | HR 72 | Ht 62.0 in | Wt 157.0 lb

## 2017-05-27 DIAGNOSIS — Z950 Presence of cardiac pacemaker: Secondary | ICD-10-CM | POA: Diagnosis not present

## 2017-05-27 DIAGNOSIS — R55 Syncope and collapse: Secondary | ICD-10-CM | POA: Diagnosis not present

## 2017-05-27 DIAGNOSIS — I442 Atrioventricular block, complete: Secondary | ICD-10-CM | POA: Diagnosis not present

## 2017-05-27 DIAGNOSIS — D649 Anemia, unspecified: Secondary | ICD-10-CM | POA: Diagnosis not present

## 2017-05-27 LAB — CUP PACEART INCLINIC DEVICE CHECK
Implantable Lead Implant Date: 20120606
Implantable Lead Implant Date: 20120606
Implantable Lead Model: 5076
Implantable Lead Model: 5076
Lead Channel Impedance Value: 390 Ohm
Lead Channel Impedance Value: 448 Ohm
Lead Channel Pacing Threshold Amplitude: 0.6 V
Lead Channel Pacing Threshold Amplitude: 0.9 V
Lead Channel Pacing Threshold Amplitude: 0.9 V
Lead Channel Pacing Threshold Pulse Width: 0.4 ms
Lead Channel Pacing Threshold Pulse Width: 0.4 ms
Lead Channel Pacing Threshold Pulse Width: 0.4 ms
Lead Channel Sensing Intrinsic Amplitude: 13.9 mV
Lead Channel Sensing Intrinsic Amplitude: 4.5 mV
Lead Channel Setting Pacing Amplitude: 2.4 V
Lead Channel Setting Pacing Pulse Width: 0.4 ms
MDC IDC LEAD LOCATION: 753859
MDC IDC LEAD LOCATION: 753860
MDC IDC MSMT LEADCHNL RA PACING THRESHOLD AMPLITUDE: 0.6 V
MDC IDC MSMT LEADCHNL RV PACING THRESHOLD PULSEWIDTH: 0.4 ms
MDC IDC MSMT LEADCHNL RV SENSING INTR AMPL: 4.5 mV
MDC IDC PG IMPLANT DT: 20120606
MDC IDC PG SERIAL: 66089338
MDC IDC SESS DTM: 20190220145819
MDC IDC SET LEADCHNL RA PACING AMPLITUDE: 2 V

## 2017-05-27 LAB — IRON AND TIBC
Iron Saturation: 6 % — CL (ref 15–55)
Iron: 29 ug/dL (ref 27–139)
Total Iron Binding Capacity: 450 ug/dL (ref 250–450)
UIBC: 421 ug/dL — AB (ref 118–369)

## 2017-05-27 LAB — FERRITIN: Ferritin: 1 ng/mL — ABNORMAL LOW (ref 15–150)

## 2017-05-27 MED ORDER — AMLODIPINE BESYLATE 10 MG PO TABS: 10.0000 mg | ORAL_TABLET | Freq: Every day | ORAL | 3 refills | Status: DC

## 2017-05-27 MED ORDER — CHLORTHALIDONE 25 MG PO TABS: 25.0000 mg | ORAL_TABLET | Freq: Every day | ORAL | 3 refills | Status: DC

## 2017-05-27 NOTE — Patient Instructions (Addendum)
Medication Instructions:  Your physician has recommended you make the following change in your medication:   Stop Diltiazem and HCTZ Begin Norvasc 10mg  tablet once per day Begin [place] (chlorthlidone) 25mg  tablet once per day  Labwork: [place] and Fe TIBC today  Testing/Procedures: None ordered.  Follow-Up: Your physician recommends that you schedule a follow-up appointment in: One Year with Dr Caryl Comes  Your physician recommends that you schedule a follow-up with the device clinic in 6 months  Any Other Special Instructions Will Be Listed Below (If Applicable).     If you need a refill on your cardiac medications before your next appointment, please call your pharmacy.

## 2017-05-27 NOTE — Progress Notes (Signed)
Patient Care Team: Leeroy Cha, MD as PCP - General (Internal Medicine) Deboraha Sprang, MD (Cardiology) Jacelyn Pi, MD (Endocrinology) Juanita Craver, MD (Gastroenterology)   HPI  Cindy [name] is a 72 y.o. female Seen in follow-up for vasovagal syncope for which she underwent Biotronik CLS pacing. She has had no interval syncope.    2015  abnormal Myoview having been taken for atypical chest pain prompted a catheterization.   She is had a rough couple of months.  She was found to be hypothyroid.  She is also developed angioedema on her ace inhibitors.  Her blood pressures been poorly controlled since then.  She was noted to be anemic.  She has a history of iron deficiency.  She is also had transient significant edema without clear trigger.  She was swollen in her feet as well as in her face.  She was started on a diuretic with interval improvement At that ER visit her blood pressure was noted to be quite high and the amlodipine was added to diltiazem   No interval syncope  Date Cr K Hgb  2/19 0.87 3.6 9.1.  (MCV 90)          Past Medical History:  Diagnosis Date  . Abnormal nuclear stress test 12/15/2013  . Anemia    hx iron def, no GI loss indentifed - resolved with diet change  . Arthritis   . GERD   . GRAVES' DISEASE 2013   I-131 ablation, now post tx hypothyroid state  . HYPERTENSION   . Impaired glucose tolerance test   . Keloid 12/31/2010   incisional   . Pacemaker -Biotronik-CLS 08/20/2010  . Postablative hypothyroidism    Qualifier: Diagnosis of  By: Joya Gaskins CMA, [place]    . Sciatica   . Syncope 08/20/2010   s/p PPM 08/2010 for sxc pause on loop recorder causing same  . Unspecified vitamin D deficiency     Past Surgical History:  Procedure Laterality Date  . APPENDECTOMY    . BREAST BIOPSY Left 2001   guided excisional bx (L) breast microcalcification at 12 o'clock- Benign  . BUNIONECTOMY Left   . CATARACT EXTRACTION  2014  . LEFT  HEART CATHETERIZATION WITH CORONARY ANGIOGRAM N/A 12/22/2013   Procedure: LEFT HEART CATHETERIZATION WITH CORONARY ANGIOGRAM;  Surgeon: Sinclair Grooms, MD;  Location: [facility] CATH LAB;  Service: Cardiovascular;  Laterality: N/A;  . PACEMAKER INSERTION    . Right ankle  06/2010   Trimalleolar fracture    Current Outpatient Medications  Medication Sig Dispense Refill  . ALPRAZolam (XANAX) 0.5 MG tablet Take 0.5 mg by mouth as needed for anxiety.  0  . aspirin 81 MG tablet Take 81 mg by mouth daily.      . cholecalciferol (VITAMIN D) 1000 UNITS tablet Take 1,000 Units by mouth daily. Reported on 06/20/2015    . diazepam (VALIUM) 5 MG tablet Take 5 mg by mouth every 6 (six) hours as needed for anxiety.    Marland Kitchen diltiazem (CARDIZEM CD) 120 MG 24 hr capsule Take 120 mg by mouth daily.  6  . hydrochlorothiazide (MICROZIDE) 12.5 MG capsule Take 30 mg by mouth daily.  1  . levothyroxine (SYNTHROID, LEVOTHROID) 112 MCG tablet Take 112 mcg by mouth daily before breakfast.    . loratadine (CLARITIN) 10 MG tablet Take 10 mg by mouth daily as needed for allergies. Reported on 06/20/2015    . methocarbamol (ROBAXIN) 500 MG tablet Take 500 mg by mouth  4 (four) times daily.    . Multiple Vitamins-Minerals (MULTIVITAMIN WITH MINERALS) tablet Take 1 tablet by mouth daily.    . naproxen (NAPROSYN) 250 MG tablet Take 250 mg by mouth daily as needed (pain).      No current facility-administered medications for this visit.     Allergies  Allergen Reactions  . Lisinopril Swelling    angiodema  . Codeine Nausea And Vomiting    Review of Systems negative except from HPI and PMH  Physical Exam Pulse 72   Ht 5\' 2"  (1.575 m)   Wt 157 lb (71.2 kg)   BMI 28.72 kg/m    BP (!) 182/76   Pulse 72   Ht 5\' 2"  (1.575 m)   Wt 157 lb (71.2 kg)   BMI 28.72 kg/m   Well developed and nourished in no acute distress HENT normal Neck supple with JVP-flat Carotids brisk and full without bruits Clear Regular rate and  rhythm, no murmurs or gallops Abd-soft with active BS without hepatomegaly No Clubbing cyanosis edema Skin-warm and dry A & Oriented  Grossly normal sensory and motor function   ECG demonstrates atrial pacing at 72 Intervals 19/07/38 Otherwise normal   Assessment and  Plan     Vasovagal syncope  Hypertension  Pacemaker - biotronik   Anemia  ACE inhibitor-angioedema   Blood pressure control is inadequate.  We will discontinue her diltiazem and resume amlodipine.  We have reviewed its potential for edema.  It would allow adjunctive use of beta-blockers now that ACE inhibitors and ARB's are off limits because of the angioedema.  Based on data on outcomes, I have taken the liberty of switching from HCTZ--chlorthalidone.  She will need electrolyte follow-up with her PCP.  Her anemia is concerning.  Surprisingly, not withstanding the notes of undisclosed iron loss in the past, her MCV is normal.  We will check a ferritin and iron binding studies.  Recommendations will follow  More than 50% of 25 min was spent in counseling related to the above

## 2017-06-08 DIAGNOSIS — E890 Postprocedural hypothyroidism: Secondary | ICD-10-CM | POA: Diagnosis not present

## 2017-06-09 DIAGNOSIS — F4323 Adjustment disorder with mixed anxiety and depressed mood: Secondary | ICD-10-CM | POA: Diagnosis not present

## 2017-06-16 ENCOUNTER — Ambulatory Visit: Payer: Self-pay | Admitting: Allergy and Immunology

## 2017-06-16 DIAGNOSIS — I10 Essential (primary) hypertension: Secondary | ICD-10-CM | POA: Diagnosis not present

## 2017-06-16 DIAGNOSIS — E890 Postprocedural hypothyroidism: Secondary | ICD-10-CM | POA: Diagnosis not present

## 2017-06-16 DIAGNOSIS — D508 Other iron deficiency anemias: Secondary | ICD-10-CM | POA: Diagnosis not present

## 2017-06-22 ENCOUNTER — Encounter: Payer: Self-pay | Admitting: Hematology

## 2017-06-22 ENCOUNTER — Telehealth: Payer: Self-pay | Admitting: Hematology

## 2017-06-22 NOTE — Telephone Encounter (Signed)
Appt has been scheduled for the pt to see Dr. Irene Limbo on 4/10 at 10am. Pt aware to arrive 30 minutes early. Letter mailed.

## 2017-06-30 DIAGNOSIS — F4323 Adjustment disorder with mixed anxiety and depressed mood: Secondary | ICD-10-CM | POA: Diagnosis not present

## 2017-07-13 NOTE — Progress Notes (Signed)
HEMATOLOGY/ONCOLOGY CONSULTATION NOTE  Date of Service: 07/15/17  Patient Care Team: Leeroy Cha, MD as PCP - General (Internal Medicine) Deboraha Sprang, MD (Cardiology) Jacelyn Pi, MD (Endocrinology) Juanita Craver, MD (Gastroenterology)  CHIEF COMPLAINTS/PURPOSE OF CONSULTATION:  Iron Deficiency Anemia  HISTORY OF PRESENTING ILLNESS:   Cindy [name] is a wonderful 72 y.o. female who has been referred to [place] by Dr Leeroy Cha for evaluation and management of Iron Deficiency Anemia. She was formerly a Musician.   The pt reports that she has been anemic off and on for several years. She notes that she donated blood regularly until the late 1990s when she first became anemic in the setting of a vegetarian diet. She notes that she has been told she was anemic up to 7 different times since the late 1990s. She has had numerous colonoscopies which all revealed no internal bleeding, but her last endoscopy and colonoscopy was 7-8 years ago. She denies any history of blood in the stools. She notes that the last thing she was told regarding her anemia was that she might not be absorbing Iron well. She notes that she takes Vitamin B12 regularly, but denies any concern for B12 deficiency and has taken PO Ferrous sulfate 325mg  once a day since late February. She has been off of Aspirin 81mg  since late February.  She denies having IV iron in the past, and denies receiving blood transfusions in the past.  She notes that she chews ice frequently.   She notes that starting in August 2018 she began reacting to her ace inhibitors with facial and lip swelling, and last presented to the ED for this reason on 05/15/17.  She notes that she has had more fatigue in the last 6 months. She has also been trying to lose weight and has lost 10 pounds in the last couple months.   She sees Dr. Juanita Craver for GI. She sees Dr. Virl Axe in Cardiology.  She received radioactive  iodine for her Graves' disease in 2013. She has taken thyroid replacement since this diagnosis, and is taking 112 mcgs of thyroid replacement currently. She also reports having fibromyalgia.  She denies any history of liver problems.   Most recent lab results (05/15/17) of CBC  is as follows: all values are WNL except for RBC at 3.13, Hgb at 9.1, HCT at 28.4. Ferritin on 05/27/17 was very low at 1 ng/mL.  Her 05/15/17 BMP revealed all values WNL except for BUN at 30.   On review of systems, pt reports expected weight loss, mild leg swelling, and denies fevers, chills, night sweats, nose bleeds, gum bleeds, any other bleeding, mouth sores, abdominal pains, changes in bowel habits, flank pain, noticing any enlarge lymph nodes, and any other symptoms.   On PMHx the pt reports GERD, HTN, Graves' Disease, Syncope with episodic complete AV block, s/p pacemaker insertion 09/2010; Multiple lipomas, pre-diabetes, fibromyalgia. On Social Hx the pt reports Former smoker, quit in 1969.  On Family Hx the pt reports her mother had severe anemia requiring blood transfusions.    MEDICAL HISTORY:  Past Medical History:  Diagnosis Date   Abnormal nuclear stress test 12/15/2013   Anemia    hx iron def, no GI loss indentifed - resolved with diet change   Arthritis    GERD    GRAVES' DISEASE 2013   I-131 ablation, now post tx hypothyroid state   HYPERTENSION    Impaired glucose tolerance test    Keloid 12/31/2010  incisional    Pacemaker -Biotronik-CLS 08/20/2010   Postablative hypothyroidism    Qualifier: Diagnosis of  By: Joya Gaskins CMA, [place]     Sciatica    Syncope 08/20/2010   s/p PPM 08/2010 for sxc pause on loop recorder causing same   Unspecified vitamin D deficiency     SURGICAL HISTORY: Past Surgical History:  Procedure Laterality Date   APPENDECTOMY     BREAST BIOPSY Left 2001   guided excisional bx (L) breast microcalcification at 12 o'clock- Benign   BUNIONECTOMY Left     CATARACT EXTRACTION  2014   LEFT HEART CATHETERIZATION WITH CORONARY ANGIOGRAM N/A 12/22/2013   Procedure: LEFT HEART CATHETERIZATION WITH CORONARY ANGIOGRAM;  Surgeon: Sinclair Grooms, MD;  Location: [facility] CATH LAB;  Service: Cardiovascular;  Laterality: N/A;   PACEMAKER INSERTION     Right ankle  06/2010   Trimalleolar fracture    SOCIAL HISTORY: Social History   Socioeconomic History   Marital status: Married    Spouse name: Not on file   Number of children: Not on file   Years of education: Not on file   Highest education level: Not on file  Occupational History   Not on file  Social Needs   Financial resource strain: Not on file   Food insecurity:    Worry: Not on file    Inability: Not on file   Transportation needs:    Medical: Not on file    Non-medical: Not on file  Tobacco Use   Smoking status: Former Smoker    Types: Cigarettes    Last attempt to quit: 04/08/1967    Years since quitting: 50.3   Smokeless tobacco: Never Used  Substance and Sexual Activity   Alcohol use: Yes    Comment: OCCASIONAL   Drug use: No   Sexual activity: Not on file  Lifestyle   Physical activity:    Days per week: Not on file    Minutes per session: Not on file   Stress: Not on file  Relationships   Social connections:    Talks on phone: Not on file    Gets together: Not on file    Attends religious service: Not on file    Active member of club or organization: Not on file    Attends meetings of clubs or organizations: Not on file    Relationship status: Not on file   Intimate partner violence:    Fear of current or ex partner: Not on file    Emotionally abused: Not on file    Physically abused: Not on file    Forced sexual activity: Not on file  Other Topics Concern   Not on file  Social History Narrative   ** Merged History Encounter **       FAMILY HISTORY: Family History  Problem Relation Age of Onset   Rheum arthritis Mother    Emphysema  Mother    Hypertension Mother    Osteoporosis Mother    COPD Mother    Prostate cancer Father    Hypertension Father    Heart attack Father    Osteoporosis Maternal Grandmother    Stroke Other     ALLERGIES:  is allergic to ace inhibitors; lisinopril; and codeine.  MEDICATIONS:  Current Outpatient Medications  Medication Sig Dispense Refill   ALPRAZolam (XANAX) 0.5 MG tablet Take 0.5 mg by mouth as needed for anxiety.  0   amLODipine (NORVASC) 10 MG tablet Take 1 tablet (10 mg total) by  mouth daily. 90 tablet 3   chlorthalidone (HYGROTON) 25 MG tablet Take 1 tablet (25 mg total) by mouth daily. 90 tablet 3   cholecalciferol (VITAMIN D) 1000 UNITS tablet Take 1,000 Units by mouth daily. Reported on 06/20/2015     diazepam (VALIUM) 5 MG tablet Take 5 mg by mouth every 6 (six) hours as needed for anxiety.     ferrous sulfate 325 (65 FE) MG tablet Take 325 mg by mouth daily with breakfast.     levothyroxine (SYNTHROID, LEVOTHROID) 112 MCG tablet Take 112 mcg by mouth daily before breakfast.     loratadine (CLARITIN) 10 MG tablet Take 10 mg by mouth daily as needed for allergies. Reported on 06/20/2015     Multiple Vitamins-Minerals (MULTIVITAMIN WITH MINERALS) tablet Take 1 tablet by mouth daily.     naproxen (NAPROSYN) 250 MG tablet Take 250 mg by mouth daily as needed (pain).      No current facility-administered medications for this visit.     REVIEW OF SYSTEMS:    10 Point review of Systems was done is negative except as noted above.  PHYSICAL EXAMINATION:  . Vitals:   07/15/17 0945  BP: (!) 148/73  Pulse: 77  Resp: 17  Temp: 98.6 F (37 C)  SpO2: 99%   Filed Weights   07/15/17 0945  Weight: 148 lb 3.2 oz (67.2 kg)   .Body mass index is 27.11 kg/m.  GENERAL:alert, in no acute distress and comfortable SKIN: no acute rashes, no significant lesions EYES: conjunctiva are pink and non-injected, sclera anicteric OROPHARYNX: MMM, no exudates, no  oropharyngeal erythema or ulceration NECK: supple, no JVD LYMPH:  no palpable lymphadenopathy in the cervical, axillary or inguinal regions LUNGS: clear to auscultation b/l with normal respiratory effort HEART: regular rate & rhythm ABDOMEN:  normoactive bowel sounds , non tender, not distended. Extremity: no pedal edema PSYCH: alert & oriented x 3 with fluent speech NEURO: no focal motor/sensory deficits  LABORATORY DATA:  I have reviewed the data as listed  . CBC Latest Ref Rng & Units 05/15/2017 10/05/2015 04/21/2014  WBC 4.0 - 10.5 K/uL 5.0 6.2 3.8(L)  Hemoglobin 12.0 - 15.0 g/dL 9.1(L) 11.0(L) 10.9(L)  Hematocrit 36.0 - 46.0 % 28.4(L) 33.8(L) 34.0(L)  Platelets 150 - 400 K/uL 366 344 338.0   Component     Latest Ref Rng & Units 05/15/2017  WBC     4.0 - 10.5 K/uL 5.0  RBC     3.87 - 5.11 MIL/uL 3.13 (L)  Hemoglobin     12.0 - 15.0 g/dL 9.1 (L)  HCT     36.0 - 46.0 % 28.4 (L)  MCV     78.0 - 100.0 fL 90.7  MCH     26.0 - 34.0 pg 29.1  MCHC     30.0 - 36.0 g/dL 32.0  RDW     11.5 - 15.5 % 12.6  Platelets     150 - 400 K/uL 366  Neutrophils     % 51  NEUT#     1.7 - 7.7 K/uL 2.5  Lymphocytes     % 38  Lymphocyte #     0.7 - 4.0 K/uL 1.9  Monocytes Relative     % 8  Monocyte #     0.1 - 1.0 K/uL 0.4  Eosinophil     % 2  Eosinophils Absolute     0.0 - 0.7 K/uL 0.1  Basophil     % 1  Basophils Absolute  0.0 - 0.1 K/uL 0.1   . CMP Latest Ref Rng & Units 05/15/2017 04/21/2014 12/15/2013  Glucose 65 - 99 mg/dL 95 106(H) 85  BUN 6 - 20 mg/dL 30(H) 25(H) 26(H)  Creatinine 0.44 - 1.00 mg/dL 0.87 0.99 1.0  Sodium 135 - 145 mmol/L 139 138 138  Potassium 3.5 - 5.1 mmol/L 3.6 3.9 3.9  Chloride 101 - 111 mmol/L 107 101 103  CO2 22 - 32 mmol/L 24 29 27   Calcium 8.9 - 10.3 mg/dL 8.9 9.7 9.6  Total Protein 6.0 - 8.3 g/dL - 8.0 -  Total Bilirubin 0.2 - 1.2 mg/dL - 0.7 -  Alkaline Phos 39 - 117 U/L - 50 -  AST 0 - 37 U/L - 16 -  ALT 0 - 35 U/L - 14 -   Component      Latest Ref Rng & Units 05/27/2017  TIBC     250 - 450 ug/dL 450  UIBC     118 - 369 ug/dL 421 (H)  Iron     27 - 139 ug/dL 29  Iron Saturation     15 - 55 % 6 (LL)  Ferritin     15 - 150 ng/mL 1 (L)     RADIOGRAPHIC STUDIES: I have personally reviewed the radiological images as listed and agreed with the findings in the report. No results found.  ASSESSMENT & PLAN:  72 y.o. female with  1. Normocytic Anemia with severe iron deficiency 2. Severe iron deficiency - ? Related to poor po iron absorption vs GI losses vs vegetarian diet PLAN -Discussed patient's most recent labs, Ferritin on 05/27/17 was at 1, Hgb at 9.1, and MCV at 90.7.  -Pt is overtly iron deficient with a ferritin of 1, yet interestingly in the setting of a normal MCV on 05/15/17 of 90.7.  -Discussed that her thyroid levels could cause anemia as well and would need to be rechecked. -Further possibilities include the slowing down of BM due to Myelodysplasia (radioactive iodine could be a risk factor) -We will order labs today - to r/o other additional etiologies of her anemia. -based on whether anemia corrects with aggressive iron replacement would need to consider additional workup. -Recommend IV Injectafer 2 doses, one week apart.  -We will see pt back in 6-8 weeks - patient will need to discuss need for additional GI workup with Dr Collene Mares and PCP.  3. Severe Hypokalemia K 2.5 -- noted on labs today. Likely due to diuretic. Plan -prescribed po potassium replacement -Take 17meq (2 tabs) po TID for 2 days then once daily. - Rpt labs with PCP in 1 week to check potassium levels.  Labs today IV Injectafer weekly x 2 doses RTC with Dr Irene Limbo in 2 months with labs   All of the patients questions were answered with apparent satisfaction. The patient knows to call the clinic with any problems, questions or concerns.  I spent 35 minutes counseling the patient face to face. The total time spent in the appointment was 45  minutes and more than 50% was on counseling and direct patient cares.    Sullivan Lone MD [place] AAHIVMS [facility] Hematology/Oncology Physician [facility]  (Office):       [phone] (Work cell):  [phone] (Fax):           [phone]  07/15/2017 10:26 AM  This document serves as a record of services personally performed by Sullivan Lone, MD. It was created on his behalf by Baldwin Jamaica,  a trained medical scribe. The creation of this record is based on the scribe's personal observations and the provider's statements to them.   .I have reviewed the above documentation for accuracy and completeness, and I agree with the above. Brunetta Genera MD MS   ADDENDUM  Labs results reviewed.  Component     Latest Ref Rng & Units 07/15/2017  WBC     3.9 - 10.3 K/uL 4.4  RBC     3.70 - 5.45 MIL/uL 4.89  Hemoglobin     11.6 - 15.9 g/dL 13.5  HCT     34.8 - 46.6 % 41.3  MCV     79.5 - 101.0 fL 84.5  MCH     25.1 - 34.0 pg 27.6  MCHC     31.5 - 36.0 g/dL 32.7  RDW     11.2 - 14.5 % 16.2 (H)  Platelets     145 - 400 K/uL 327  Neutrophils     % 56  NEUT#     1.5 - 6.5 K/uL 2.5  Lymphocytes     % 34  Lymphocyte #     0.9 - 3.3 K/uL 1.5  Monocytes Relative     % 7  Monocyte #     0.1 - 0.9 K/uL 0.3  Eosinophil     % 2  Eosinophils Absolute     0.0 - 0.5 K/uL 0.1  Basophil     % 1  Basophils Absolute     0.0 - 0.1 K/uL 0.0  Sodium     136 - 145 mmol/L 140  Potassium     3.5 - 5.1 mmol/L 2.5 (LL)  Chloride     98 - 109 mmol/L 97 (L)  CO2     22 - 29 mmol/L 30 (H)  Glucose     70 - 140 mg/dL 94  BUN     7 - 26 mg/dL 25  Creatinine     0.60 - 1.10 mg/dL 1.00  Calcium     8.4 - 10.4 mg/dL 10.3  Total Protein     6.4 - 8.3 g/dL 8.3  Albumin     3.5 - 5.0 g/dL 4.3  AST     5 - 34 U/L 22  ALT     0 - 55 U/L 26  Alkaline Phosphatase     40 - 150 U/L 67  Total Bilirubin     0.2 - 1.2 mg/dL 0.6  GFR, Est Non African American     >60 mL/min  55 (L)  GFR, Est African American     >60 mL/min >60  Anion gap     3 - 11 13 (H)  IgG (Immunoglobin G), Serum     700 - 1,600 mg/dL 1,302  IgA     64 - 422 mg/dL 175  IgM (Immunoglobulin M), Srm     26 - 217 mg/dL 77  Total Protein ELP     6.0 - 8.5 g/dL 7.8  Albumin SerPl Elph-Mcnc     2.9 - 4.4 g/dL 3.9  Alpha 1     0.0 - 0.4 g/dL 0.3  Alpha2 Glob SerPl Elph-Mcnc     0.4 - 1.0 g/dL 1.0  B-Globulin SerPl Elph-Mcnc     0.7 - 1.3 g/dL 1.2  Gamma Glob SerPl Elph-Mcnc     0.4 - 1.8 g/dL 1.4  M Protein SerPl Elph-Mcnc     Not Observed g/dL Not Observed  Globulin, Total  2.2 - 3.9 g/dL 3.9  Albumin/Glob SerPl     0.7 - 1.7 1.1  IFE 1      Comment  Please Note (HCV):      Comment  Iron     41 - 142 ug/dL 39 (L)  TIBC     236 - 444 ug/dL 398  Saturation Ratios     21 - 57 % 10 (L)  UIBC     ug/dL 359  Kappa free light chain     3.3 - 19.4 mg/L 22.5 (H)  Lamda free light chains     5.7 - 26.3 mg/L 14.6  Kappa, lamda light chain ratio     0.26 - 1.65 1.54  Retic Ct Pct     0.7 - 2.1 % 1.3  RBC.     3.70 - 5.45 MIL/uL 4.89  Retic Count, Absolute     33.7 - 90.7 K/uL 63.6  Vitamin B12     180 - 914 pg/mL 1,398 (H)  Ferritin     9 - 269 ng/mL 15  LDH     125 - 245 U/L 228  Haptoglobin     34 - 200 mg/dL 212 (H)  Erythropoietin     2.6 - 18.5 mIU/mL 19.2 (H)   No hemolysis noted. No M spike hgb improved to 13.5  Ferritin up to 15 from 1.  -Could consider po iron replacement with Iron polysaccharide 150mg  po BID at this time with reassessment of hematologic labs and iron labs at that time in a couple of months.  Brunetta Genera MD MS

## 2017-07-15 ENCOUNTER — Inpatient Hospital Stay: Payer: PPO | Attending: Hematology | Admitting: Hematology

## 2017-07-15 ENCOUNTER — Telehealth: Payer: Self-pay

## 2017-07-15 ENCOUNTER — Encounter: Payer: Self-pay | Admitting: Hematology

## 2017-07-15 ENCOUNTER — Inpatient Hospital Stay: Payer: PPO

## 2017-07-15 VITALS — BP 148/73 | HR 77 | Temp 98.6°F | Resp 17 | Ht 62.0 in | Wt 148.2 lb

## 2017-07-15 DIAGNOSIS — Z79899 Other long term (current) drug therapy: Secondary | ICD-10-CM | POA: Insufficient documentation

## 2017-07-15 DIAGNOSIS — D649 Anemia, unspecified: Secondary | ICD-10-CM

## 2017-07-15 DIAGNOSIS — D509 Iron deficiency anemia, unspecified: Secondary | ICD-10-CM | POA: Insufficient documentation

## 2017-07-15 DIAGNOSIS — Z87891 Personal history of nicotine dependence: Secondary | ICD-10-CM | POA: Insufficient documentation

## 2017-07-15 DIAGNOSIS — E876 Hypokalemia: Secondary | ICD-10-CM | POA: Insufficient documentation

## 2017-07-15 LAB — CBC WITH DIFFERENTIAL (CANCER CENTER ONLY)
Basophils Absolute: 0 10*3/uL (ref 0.0–0.1)
Basophils Relative: 1 %
EOS PCT: 2 %
Eosinophils Absolute: 0.1 10*3/uL (ref 0.0–0.5)
HCT: 41.3 % (ref 34.8–46.6)
Hemoglobin: 13.5 g/dL (ref 11.6–15.9)
LYMPHS ABS: 1.5 10*3/uL (ref 0.9–3.3)
LYMPHS PCT: 34 %
MCH: 27.6 pg (ref 25.1–34.0)
MCHC: 32.7 g/dL (ref 31.5–36.0)
MCV: 84.5 fL (ref 79.5–101.0)
Monocytes Absolute: 0.3 10*3/uL (ref 0.1–0.9)
Monocytes Relative: 7 %
Neutro Abs: 2.5 10*3/uL (ref 1.5–6.5)
Neutrophils Relative %: 56 %
PLATELETS: 327 10*3/uL (ref 145–400)
RBC: 4.89 MIL/uL (ref 3.70–5.45)
RDW: 16.2 % — ABNORMAL HIGH (ref 11.2–14.5)
WBC Count: 4.4 10*3/uL (ref 3.9–10.3)

## 2017-07-15 LAB — FERRITIN: Ferritin: 15 ng/mL (ref 9–269)

## 2017-07-15 LAB — CMP (CANCER CENTER ONLY)
ALT: 26 U/L (ref 0–55)
AST: 22 U/L (ref 5–34)
Albumin: 4.3 g/dL (ref 3.5–5.0)
Alkaline Phosphatase: 67 U/L (ref 40–150)
Anion gap: 13 — ABNORMAL HIGH (ref 3–11)
BILIRUBIN TOTAL: 0.6 mg/dL (ref 0.2–1.2)
BUN: 25 mg/dL (ref 7–26)
CO2: 30 mmol/L — ABNORMAL HIGH (ref 22–29)
Calcium: 10.3 mg/dL (ref 8.4–10.4)
Chloride: 97 mmol/L — ABNORMAL LOW (ref 98–109)
Creatinine: 1 mg/dL (ref 0.60–1.10)
GFR, EST NON AFRICAN AMERICAN: 55 mL/min — AB (ref >60)
Glucose, Bld: 94 mg/dL (ref 70–140)
POTASSIUM: 2.5 mmol/L — AB (ref 3.5–5.1)
Sodium: 140 mmol/L (ref 136–145)
TOTAL PROTEIN: 8.3 g/dL (ref 6.4–8.3)

## 2017-07-15 LAB — VITAMIN B12: VITAMIN B 12: 1398 pg/mL — AB (ref 180–914)

## 2017-07-15 LAB — IRON AND TIBC
IRON: 39 ug/dL — AB (ref 41–142)
SATURATION RATIOS: 10 % — AB (ref 21–57)
TIBC: 398 ug/dL (ref 236–444)
UIBC: 359 ug/dL

## 2017-07-15 LAB — RETICULOCYTES
RBC.: 4.89 MIL/uL (ref 3.70–5.45)
Retic Count, Absolute: 63.6 10*3/uL (ref 33.7–90.7)
Retic Ct Pct: 1.3 % (ref 0.7–2.1)

## 2017-07-15 LAB — LACTATE DEHYDROGENASE: LDH: 228 U/L (ref 125–245)

## 2017-07-15 MED ORDER — POTASSIUM CHLORIDE CRYS ER 20 MEQ PO TBCR: EXTENDED_RELEASE_TABLET | ORAL | 0 refills | Status: DC

## 2017-07-15 NOTE — Telephone Encounter (Signed)
Pt K 2.5. Potassium 2mEq ordered, and pt confirmed correct pharmacy. Pt to f/u with PCP in one week to recheck K. Pt not on a diuretic at this time.

## 2017-07-16 ENCOUNTER — Telehealth: Payer: Self-pay

## 2017-07-16 LAB — ERYTHROPOIETIN: Erythropoietin: 19.2 m[IU]/mL — ABNORMAL HIGH (ref 2.6–18.5)

## 2017-07-16 LAB — KAPPA/LAMBDA LIGHT CHAINS
Kappa free light chain: 22.5 mg/L — ABNORMAL HIGH (ref 3.3–19.4)
Kappa, lambda light chain ratio: 1.54 (ref 0.26–1.65)
Lambda free light chains: 14.6 mg/L (ref 5.7–26.3)

## 2017-07-16 LAB — HAPTOGLOBIN: Haptoglobin: 212 mg/dL — ABNORMAL HIGH (ref 34–200)

## 2017-07-16 NOTE — Telephone Encounter (Signed)
Spoke with patient concerning upcoming appointment and a mailed calender. 4/10 los

## 2017-07-20 ENCOUNTER — Ambulatory Visit (HOSPITAL_COMMUNITY)
Admission: RE | Admit: 2017-07-20 | Discharge: 2017-07-20 | Disposition: A | Discharge status: Home / Self Care | Payer: PPO | Source: Ambulatory Visit | Attending: Hematology | Admitting: Hematology

## 2017-07-20 ENCOUNTER — Other Ambulatory Visit: Payer: Self-pay | Admitting: Oncology

## 2017-07-20 DIAGNOSIS — D509 Iron deficiency anemia, unspecified: Secondary | ICD-10-CM | POA: Insufficient documentation

## 2017-07-20 LAB — MULTIPLE MYELOMA PANEL, SERUM
ALBUMIN SERPL ELPH-MCNC: 3.9 g/dL (ref 2.9–4.4)
ALPHA 1: 0.3 g/dL (ref 0.0–0.4)
Albumin/Glob SerPl: 1.1 (ref 0.7–1.7)
Alpha2 Glob SerPl Elph-Mcnc: 1 g/dL (ref 0.4–1.0)
B-Globulin SerPl Elph-Mcnc: 1.2 g/dL (ref 0.7–1.3)
Gamma Glob SerPl Elph-Mcnc: 1.4 g/dL (ref 0.4–1.8)
Globulin, Total: 3.9 g/dL (ref 2.2–3.9)
IGA: 175 mg/dL (ref 64–422)
IgG (Immunoglobin G), Serum: 1302 mg/dL (ref 700–1600)
IgM (Immunoglobulin M), Srm: 77 mg/dL (ref 26–217)
TOTAL PROTEIN ELP: 7.8 g/dL (ref 6.0–8.5)

## 2017-07-20 MED ORDER — SODIUM CHLORIDE 0.9 % IV SOLN
510.0000 mg | Freq: Once | INTRAVENOUS | Status: AC
  Administered 2017-07-20: 510 mg via INTRAVENOUS
  Filled 2017-07-20: qty 17

## 2017-07-20 NOTE — Discharge Instructions (Signed)

## 2017-07-20 NOTE — Progress Notes (Signed)
PATIENT CARE CENTER NOTE  Diagnosis: Iron Deficiency Anemia    Provider: Dr. Irene Limbo   Procedure: IV Feraheme    Note: Patient received infusion of IV Feraheme. Patient tolerated infusion well with no adverse reaction. Patient monitored and vital signs taken 30 minutes after completion of infusion. Discharge instructions given to patient. Patient alert, oriented and ambulatory at discharge.

## 2017-07-21 DIAGNOSIS — F4323 Adjustment disorder with mixed anxiety and depressed mood: Secondary | ICD-10-CM | POA: Diagnosis not present

## 2017-07-27 ENCOUNTER — Ambulatory Visit (HOSPITAL_COMMUNITY)
Admission: RE | Admit: 2017-07-27 | Discharge: 2017-07-27 | Disposition: A | Discharge status: Home / Self Care | Payer: PPO | Source: Ambulatory Visit | Attending: Oncology | Admitting: Oncology

## 2017-07-27 DIAGNOSIS — D509 Iron deficiency anemia, unspecified: Secondary | ICD-10-CM | POA: Diagnosis not present

## 2017-07-27 MED ORDER — SODIUM CHLORIDE 0.9 % IV SOLN
510.0000 mg | Freq: Once | INTRAVENOUS | Status: AC
  Administered 2017-07-27: 510 mg via INTRAVENOUS
  Filled 2017-07-27: qty 17

## 2017-07-27 NOTE — Discharge Instructions (Signed)

## 2017-07-27 NOTE — Progress Notes (Signed)
Patient received Feraheme via PIV. Tolerated well, vitals stable, discharge instructions given, verbalized understanding. Patient alert, oriented and ambulatory at the time of discharge.

## 2017-07-28 DIAGNOSIS — Z1231 Encounter for screening mammogram for malignant neoplasm of breast: Secondary | ICD-10-CM | POA: Diagnosis not present

## 2017-07-30 DIAGNOSIS — R928 Other abnormal and inconclusive findings on diagnostic imaging of breast: Secondary | ICD-10-CM | POA: Diagnosis not present

## 2017-08-05 ENCOUNTER — Telehealth: Payer: Self-pay

## 2017-08-05 NOTE — Telephone Encounter (Signed)
Called pt home phone and left VM that she may hold off on oral iron at this time d/t IV injectafer received on 4/15 and 4/22. Will f/u on 09/09/17 with lab work and doctor visit. Pt encouraged to call back with any questions [phone].

## 2017-08-10 DIAGNOSIS — D509 Iron deficiency anemia, unspecified: Secondary | ICD-10-CM | POA: Diagnosis not present

## 2017-08-10 DIAGNOSIS — D631 Anemia in chronic kidney disease: Secondary | ICD-10-CM | POA: Diagnosis not present

## 2017-08-10 DIAGNOSIS — Z1389 Encounter for screening for other disorder: Secondary | ICD-10-CM | POA: Diagnosis not present

## 2017-08-10 DIAGNOSIS — E890 Postprocedural hypothyroidism: Secondary | ICD-10-CM | POA: Diagnosis not present

## 2017-08-10 DIAGNOSIS — R7303 Prediabetes: Secondary | ICD-10-CM | POA: Diagnosis not present

## 2017-08-10 DIAGNOSIS — N183 Chronic kidney disease, stage 3 (moderate): Secondary | ICD-10-CM | POA: Diagnosis not present

## 2017-08-10 DIAGNOSIS — I10 Essential (primary) hypertension: Secondary | ICD-10-CM | POA: Diagnosis not present

## 2017-08-10 DIAGNOSIS — E785 Hyperlipidemia, unspecified: Secondary | ICD-10-CM | POA: Diagnosis not present

## 2017-08-10 DIAGNOSIS — Z0000 Encounter for general adult medical examination without abnormal findings: Secondary | ICD-10-CM | POA: Diagnosis not present

## 2017-08-10 DIAGNOSIS — G894 Chronic pain syndrome: Secondary | ICD-10-CM | POA: Diagnosis not present

## 2017-08-10 DIAGNOSIS — F329 Major depressive disorder, single episode, unspecified: Secondary | ICD-10-CM | POA: Diagnosis not present

## 2017-08-11 DIAGNOSIS — F4323 Adjustment disorder with mixed anxiety and depressed mood: Secondary | ICD-10-CM | POA: Diagnosis not present

## 2017-08-19 DIAGNOSIS — M1990 Unspecified osteoarthritis, unspecified site: Secondary | ICD-10-CM | POA: Diagnosis not present

## 2017-08-19 DIAGNOSIS — M797 Fibromyalgia: Secondary | ICD-10-CM | POA: Diagnosis not present

## 2017-08-19 DIAGNOSIS — M791 Myalgia, unspecified site: Secondary | ICD-10-CM | POA: Diagnosis not present

## 2017-09-08 NOTE — Progress Notes (Signed)
HEMATOLOGY/ONCOLOGY CLINIC NOTE  Date of Service: 09/09/2017   Patient Care Team: Leeroy Cha, MD as PCP - General (Internal Medicine) Deboraha Sprang, MD (Cardiology) Jacelyn Pi, MD (Endocrinology) Juanita Craver, MD (Gastroenterology)  CHIEF COMPLAINTS/PURPOSE OF CONSULTATION:  Follow up for Iron Deficiency Anemia  HISTORY OF PRESENTING ILLNESS:   Cindy [name] is a wonderful 72 y.o. female who has been referred to [place] by Dr Leeroy Cha for evaluation and management of Iron Deficiency Anemia. She was formerly a Musician.   The pt reports that she has been anemic off and on for several years. She notes that she donated blood regularly until the late 1990s when she first became anemic in the setting of a vegetarian diet. She notes that she has been told she was anemic up to 7 different times since the late 1990s. She has had numerous colonoscopies which all revealed no internal bleeding, but her last endoscopy and colonoscopy was 7-8 years ago. She denies any history of blood in the stools. She notes that the last thing she was told regarding her anemia was that she might not be absorbing Iron well. She notes that she takes Vitamin B12 regularly, but denies any concern for B12 deficiency and has taken PO Ferrous sulfate 325mg  once a day since late February. She has been off of Aspirin 81mg  since late February.  She denies having IV iron in the past, and denies receiving blood transfusions in the past.  She notes that she chews ice frequently.   She notes that starting in August 2018 she began reacting to her ace inhibitors with facial and lip swelling, and last presented to the ED for this reason on 05/15/17.  She notes that she has had more fatigue in the last 6 months. She has also been trying to lose weight and has lost 10 pounds in the last couple months.   She sees Dr. Juanita Craver for GI. She sees Dr. Virl Axe in Cardiology.  She received  radioactive iodine for her Graves' disease in 2013. She has taken thyroid replacement since this diagnosis, and is taking 112 mcgs of thyroid replacement currently. She also reports having fibromyalgia.  She denies any history of liver problems.   Most recent lab results (05/15/17) of CBC  is as follows: all values are WNL except for RBC at 3.13, Hgb at 9.1, HCT at 28.4. Ferritin on 05/27/17 was very low at 1 ng/mL.  Her 05/15/17 BMP revealed all values WNL except for BUN at 30.   On review of systems, pt reports expected weight loss, mild leg swelling, and denies fevers, chills, night sweats, nose bleeds, gum bleeds, any other bleeding, mouth sores, abdominal pains, changes in bowel habits, flank pain, noticing any enlarge lymph nodes, and any other symptoms.   On PMHx the pt reports GERD, HTN, Graves' Disease, Syncope with episodic complete AV block, s/p pacemaker insertion 09/2010; Multiple lipomas, pre-diabetes, fibromyalgia. On Social Hx the pt reports Former smoker, quit in 1969.  On Family Hx the pt reports her mother had severe anemia requiring blood transfusions.    INTERVAL HISTORY  Cindy [name] is here for a follow up of her IDA and discuss her lab workup. She presents to the clinic today by herself. She notes she developed a rash on her b/l legs after her second dose IV Injectafer. This rash has not spread and over times has become less raised. Onset was within a week of the second dose. This does  itch for her and will occasionally burn. She has tried benadryl, hydrocortisone and moisturizers. No rash presents elsewhere. She denies any new substances or bites or exposures that could cause this.  She notes she is no longer vegetarian and has a balanced diet now. She has not seen Dr. Collene Mares for more GI workup.  She notes she is was taken off Amlodipine and on Chlorthalidone currently. Her BP still high.   On review of symptoms, pt notes rash on her lower extremities, improved since late  April.    MEDICAL HISTORY:  Past Medical History:  Diagnosis Date  . Abnormal nuclear stress test 12/15/2013  . Anemia    hx iron def, no GI loss indentifed - resolved with diet change  . Arthritis   . GERD   . GRAVES' DISEASE 2013   I-131 ablation, now post tx hypothyroid state  . HYPERTENSION   . Impaired glucose tolerance test   . Keloid 12/31/2010   incisional   . Pacemaker -Biotronik-CLS 08/20/2010  . Postablative hypothyroidism    Qualifier: Diagnosis of  By: Joya Gaskins CMA, [place]    . Sciatica   . Syncope 08/20/2010   s/p PPM 08/2010 for sxc pause on loop recorder causing same  . Unspecified vitamin D deficiency     SURGICAL HISTORY: Past Surgical History:  Procedure Laterality Date  . APPENDECTOMY    . BREAST BIOPSY Left 2001   guided excisional bx (L) breast microcalcification at 12 o'clock- Benign  . BUNIONECTOMY Left   . CATARACT EXTRACTION  2014  . LEFT HEART CATHETERIZATION WITH CORONARY ANGIOGRAM N/A 12/22/2013   Procedure: LEFT HEART CATHETERIZATION WITH CORONARY ANGIOGRAM;  Surgeon: Sinclair Grooms, MD;  Location: [facility] CATH LAB;  Service: Cardiovascular;  Laterality: N/A;  . PACEMAKER INSERTION    . Right ankle  06/2010   Trimalleolar fracture    SOCIAL HISTORY: Social History   Socioeconomic History  . Marital status: Married    Spouse name: Not on file  . Number of children: Not on file  . Years of education: Not on file  . Highest education level: Not on file  Occupational History  . Not on file  Social Needs  . Financial resource strain: Not on file  . Food insecurity:    Worry: Not on file    Inability: Not on file  . Transportation needs:    Medical: Not on file    Non-medical: Not on file  Tobacco Use  . Smoking status: Former Smoker    Types: Cigarettes    Last attempt to quit: 04/08/1967    Years since quitting: 50.4  . Smokeless tobacco: Never Used  Substance and Sexual Activity  . Alcohol use: Yes    Comment: OCCASIONAL  . Drug use: No   . Sexual activity: Not on file  Lifestyle  . Physical activity:    Days per week: Not on file    Minutes per session: Not on file  . Stress: Not on file  Relationships  . Social connections:    Talks on phone: Not on file    Gets together: Not on file    Attends religious service: Not on file    Active member of club or organization: Not on file    Attends meetings of clubs or organizations: Not on file    Relationship status: Not on file  . Intimate partner violence:    Fear of current or ex partner: Not on file    Emotionally abused: Not  on file    Physically abused: Not on file    Forced sexual activity: Not on file  Other Topics Concern  . Not on file  Social History Narrative   ** Merged History Encounter **       FAMILY HISTORY: Family History  Problem Relation Age of Onset  . Rheum arthritis Mother   . Emphysema Mother   . Hypertension Mother   . Osteoporosis Mother   . COPD Mother   . Prostate cancer Father   . Hypertension Father   . Heart attack Father   . Osteoporosis Maternal Grandmother   . Stroke Other     ALLERGIES:  is allergic to ace inhibitors; lisinopril; and codeine.  MEDICATIONS:  Current Outpatient Medications  Medication Sig Dispense Refill  . ALPRAZolam (XANAX) 0.5 MG tablet Take 0.5 mg by mouth as needed for anxiety.  0  . cholecalciferol (VITAMIN D) 1000 UNITS tablet Take 1,000 Units by mouth daily. Reported on 06/20/2015    . diazepam (VALIUM) 5 MG tablet Take 5 mg by mouth every 6 (six) hours as needed for anxiety.    . ferrous sulfate 325 (65 FE) MG tablet Take 325 mg by mouth daily with breakfast.    . levothyroxine (SYNTHROID, LEVOTHROID) 112 MCG tablet Take 112 mcg by mouth daily before breakfast.    . loratadine (CLARITIN) 10 MG tablet Take 10 mg by mouth daily as needed for allergies. Reported on 06/20/2015    . Multiple Vitamins-Minerals (MULTIVITAMIN WITH MINERALS) tablet Take 1 tablet by mouth daily.    . naproxen (NAPROSYN) 250  MG tablet Take 250 mg by mouth daily as needed (pain).     Marland Kitchen amLODipine (NORVASC) 10 MG tablet Take 1 tablet (10 mg total) by mouth daily. (Patient not taking: Reported on 09/09/2017) 90 tablet 3  . chlorthalidone (HYGROTON) 25 MG tablet Take 1 tablet (25 mg total) by mouth daily. 90 tablet 3  . potassium chloride SA (K-DUR,KLOR-CON) 20 MEQ tablet Take 60meq (2 tabs) po TID for 2 days then once daily. Rpt labs with PCP in 1 week to check potassium levels (Patient not taking: Reported on 09/09/2017) 30 tablet 0   No current facility-administered medications for this visit.     REVIEW OF SYSTEMS:   .10 Point review of Systems was done is negative except as noted above.   PHYSICAL EXAMINATION:  Vitals:   09/09/17 0948  BP: (!) 151/79  Pulse: 65  Resp: 17  Temp: 98.1 F (36.7 C)  SpO2: 100%   Filed Weights   09/09/17 0948  Weight: 147 lb 1.6 oz (66.7 kg)   .Body mass index is 26.9 kg/m.  GENERAL:alert, in no acute distress and comfortable SKIN: light pink rsolving rash on lower extremity EYES: conjunctiva are pink and non-injected, sclera anicteric OROPHARYNX: MMM, no exudates, no oropharyngeal erythema or ulceration NECK: supple, no JVD LYMPH:  no palpable lymphadenopathy in the cervical, axillary or inguinal regions LUNGS: clear to auscultation b/l with normal respiratory effort HEART: regular rate & rhythm ABDOMEN:  normoactive bowel sounds , non tender, not distended. Extremity: no pedal edema PSYCH: alert & oriented x 3 with fluent speech NEURO: no focal motor/sensory deficits  LABORATORY DATA:  I have reviewed the data as listed  . CBC Latest Ref Rng & Units 09/09/2017 07/15/2017 05/15/2017  WBC 3.9 - 10.3 K/uL 4.1 4.4 5.0  Hemoglobin 11.6 - 15.9 g/dL 13.8 13.5 9.1(L)  Hematocrit 34.8 - 46.6 % 41.8 41.3 28.4(L)  Platelets 145 -  400 K/uL 243 327 366   . CMP Latest Ref Rng & Units 09/09/2017 07/15/2017 05/15/2017  Glucose 70 - 140 mg/dL 109 94 95  BUN 7 - 26 mg/dL 30(H) 25  30(H)  Creatinine 0.60 - 1.10 mg/dL 1.05 1.00 0.87  Sodium 136 - 145 mmol/L 139 140 139  Potassium 3.5 - 5.1 mmol/L 3.1(L) 2.5(LL) 3.6  Chloride 98 - 109 mmol/L 98 97(L) 107  CO2 22 - 29 mmol/L 30(H) 30(H) 24  Calcium 8.4 - 10.4 mg/dL 9.6 10.3 8.9  Total Protein 6.4 - 8.3 g/dL 7.3 8.3 -  Total Bilirubin 0.2 - 1.2 mg/dL 0.7 0.6 -  Alkaline Phos 40 - 150 U/L 58 67 -  AST 5 - 34 U/L 18 22 -  ALT 0 - 55 U/L 18 26 -   Component     Latest Ref Rng & Units 05/27/2017  TIBC     250 - 450 ug/dL 450  UIBC     118 - 369 ug/dL 421 (H)  Iron     27 - 139 ug/dL 29  Iron Saturation     15 - 55 % 6 (LL)  Ferritin     15 - 150 ng/mL 1 (L)   . Lab Results  Component Value Date   IRON 84 09/09/2017   TIBC 258 09/09/2017   IRONPCTSAT 33 09/09/2017   (Iron and TIBC)  Lab Results  Component Value Date   FERRITIN 221 09/09/2017     RADIOGRAPHIC STUDIES: I have personally reviewed the radiological images as listed and agreed with the findings in the report. No results found.  ASSESSMENT & PLAN:   72 y.o. female with  1. Normocytic Anemia with severe iron deficiency Workup showed: No hemolysis noted. No M spike Initially Pt is overtly iron deficient with a ferritin of 1, yet interestingly in the setting of a normal MCV on 05/15/17 of 90.7.  hgb improved to 13.8 Ferritin at 221.  2. Severe iron deficiency - ? Related to poor po iron absorption vs GI losses vs vegetarian diet PLAN  -She received IV Injectafer on 07/20/17 and 07/27/17 -She responded well to IV Iron as her Hg normalized to 13.8 and Ferritin at 221 -She developed a delayed rash after 2nd dose of  Injectafer which has signiifcantly improved. - I discussed this type of reaction is not common but given it has persisted I recommend she see a dermatologist to further evaluate and manage. Will get referral from PCP. -I willpremedicate her if she needs IV Iron intervention in the future.  -Given her Ferritin has normalized, no  need to proceed with oral iron intervention.  -patient will discuss need for additional GI workup with Dr Collene Mares and PCP. -Follow up with pt in 6 months and if she remains stable we shall discharge her back to her PCP   3. Severe Hypokalemia --Likely due to diuretic. --at 3.1 today (09/09/17) up from 2.5 Plan -previously prescribed po potassium replacement -Continue to take 31meq (2 tabs) po once daily. I refilled today (09/09/17) -Continue Chlorthalidone and Amlodipine as prescribed. Continue to follow up with Cardiologist for management. -I suggest she use her compression socks first in the morning and use during through the day as needed.  -f/u potassium levels and ongoing replacement with PCP   Refill oral Potassium 58meq today  RTC with Dr Irene Limbo in 6 months with    All of the patients questions were answered with apparent satisfaction. The patient knows to call the clinic  with any problems, questions or concerns.  . The total time spent in the appointment was 20 minutes and more than 50% was on counseling and direct patient cares.   Sullivan Lone MD MS AAHIVMS [facility] Hematology/Oncology Physician [facility]  (Office):       [phone] (Work cell):  [phone] (Fax):           [phone]  09/09/2017 10:23 AM  I, Joslyn Devon, am acting as scribe for Sullivan Lone, MD.    .Patient was Personally and independently interviewed, examined and relevant elements of the history of present illness were reviewed in .I have reviewed the above documentation for accuracy and completeness, and I agree with the above.    Brunetta Genera MD MS

## 2017-09-09 ENCOUNTER — Encounter: Payer: Self-pay | Admitting: Hematology

## 2017-09-09 ENCOUNTER — Inpatient Hospital Stay: Payer: PPO | Attending: Hematology | Admitting: Hematology

## 2017-09-09 ENCOUNTER — Inpatient Hospital Stay: Payer: PPO

## 2017-09-09 ENCOUNTER — Telehealth: Payer: Self-pay

## 2017-09-09 VITALS — BP 151/79 | HR 65 | Temp 98.1°F | Resp 17 | Ht 62.0 in | Wt 147.1 lb

## 2017-09-09 DIAGNOSIS — I10 Essential (primary) hypertension: Secondary | ICD-10-CM | POA: Diagnosis not present

## 2017-09-09 DIAGNOSIS — Z87891 Personal history of nicotine dependence: Secondary | ICD-10-CM | POA: Insufficient documentation

## 2017-09-09 DIAGNOSIS — D509 Iron deficiency anemia, unspecified: Secondary | ICD-10-CM

## 2017-09-09 DIAGNOSIS — D649 Anemia, unspecified: Secondary | ICD-10-CM

## 2017-09-09 DIAGNOSIS — E876 Hypokalemia: Secondary | ICD-10-CM

## 2017-09-09 DIAGNOSIS — Z79899 Other long term (current) drug therapy: Secondary | ICD-10-CM

## 2017-09-09 LAB — CBC WITH DIFFERENTIAL (CANCER CENTER ONLY)
BASOS PCT: 1 %
Basophils Absolute: 0 10*3/uL (ref 0.0–0.1)
EOS ABS: 0.1 10*3/uL (ref 0.0–0.5)
Eosinophils Relative: 2 %
HCT: 41.8 % (ref 34.8–46.6)
HEMOGLOBIN: 13.8 g/dL (ref 11.6–15.9)
Lymphocytes Relative: 30 %
Lymphs Abs: 1.2 10*3/uL (ref 0.9–3.3)
MCH: 28.7 pg (ref 25.1–34.0)
MCHC: 33 g/dL (ref 31.5–36.0)
MCV: 86.9 fL (ref 79.5–101.0)
Monocytes Absolute: 0.3 10*3/uL (ref 0.1–0.9)
Monocytes Relative: 8 %
NEUTROS PCT: 59 %
Neutro Abs: 2.5 10*3/uL (ref 1.5–6.5)
Platelet Count: 243 10*3/uL (ref 145–400)
RBC: 4.81 MIL/uL (ref 3.70–5.45)
RDW: 16.8 % — ABNORMAL HIGH (ref 11.2–14.5)
WBC: 4.1 10*3/uL (ref 3.9–10.3)

## 2017-09-09 LAB — CMP (CANCER CENTER ONLY)
ALBUMIN: 4.1 g/dL (ref 3.5–5.0)
ALT: 18 U/L (ref 0–55)
ANION GAP: 11 (ref 3–11)
AST: 18 U/L (ref 5–34)
Alkaline Phosphatase: 58 U/L (ref 40–150)
BUN: 30 mg/dL — ABNORMAL HIGH (ref 7–26)
CALCIUM: 9.6 mg/dL (ref 8.4–10.4)
CO2: 30 mmol/L — AB (ref 22–29)
Chloride: 98 mmol/L (ref 98–109)
Creatinine: 1.05 mg/dL (ref 0.60–1.10)
GFR, EST AFRICAN AMERICAN: 60 mL/min — AB (ref >60)
GFR, Estimated: 52 mL/min — ABNORMAL LOW (ref >60)
GLUCOSE: 109 mg/dL (ref 70–140)
POTASSIUM: 3.1 mmol/L — AB (ref 3.5–5.1)
SODIUM: 139 mmol/L (ref 136–145)
Total Bilirubin: 0.7 mg/dL (ref 0.2–1.2)
Total Protein: 7.3 g/dL (ref 6.4–8.3)

## 2017-09-09 LAB — IRON AND TIBC
Iron: 84 ug/dL (ref 41–142)
SATURATION RATIOS: 33 % (ref 21–57)
TIBC: 258 ug/dL (ref 236–444)
UIBC: 174 ug/dL

## 2017-09-09 LAB — RETICULOCYTES
RBC.: 4.81 MIL/uL (ref 3.70–5.45)
RETIC CT PCT: 1.2 % (ref 0.7–2.1)
Retic Count, Absolute: 57.7 10*3/uL (ref 33.7–90.7)

## 2017-09-09 LAB — FERRITIN: Ferritin: 221 ng/mL (ref 9–269)

## 2017-09-09 MED ORDER — POTASSIUM CHLORIDE CRYS ER 20 MEQ PO TBCR: 40.0000 meq | EXTENDED_RELEASE_TABLET | Freq: Every day | ORAL | 0 refills | Status: DC

## 2017-09-09 NOTE — Telephone Encounter (Signed)
Printed avs and calender of upcoming appointment. Per 6/5 los

## 2017-09-11 DIAGNOSIS — H04123 Dry eye syndrome of bilateral lacrimal glands: Secondary | ICD-10-CM | POA: Diagnosis not present

## 2017-09-14 DIAGNOSIS — F4323 Adjustment disorder with mixed anxiety and depressed mood: Secondary | ICD-10-CM | POA: Diagnosis not present

## 2017-10-20 DIAGNOSIS — L821 Other seborrheic keratosis: Secondary | ICD-10-CM | POA: Diagnosis not present

## 2017-10-20 DIAGNOSIS — L309 Dermatitis, unspecified: Secondary | ICD-10-CM | POA: Diagnosis not present

## 2017-11-09 DIAGNOSIS — M5416 Radiculopathy, lumbar region: Secondary | ICD-10-CM | POA: Diagnosis not present

## 2017-11-25 ENCOUNTER — Ambulatory Visit (INDEPENDENT_AMBULATORY_CARE_PROVIDER_SITE_OTHER): Payer: PPO | Admitting: *Deleted

## 2017-11-25 DIAGNOSIS — R55 Syncope and collapse: Secondary | ICD-10-CM | POA: Diagnosis not present

## 2017-11-25 LAB — CUP PACEART INCLINIC DEVICE CHECK
Brady Statistic RA Percent Paced: 57 %
Brady Statistic RV Percent Paced: 4 %
Implantable Lead Implant Date: 20120606
Implantable Lead Implant Date: 20120606
Implantable Lead Location: 753859
Implantable Lead Model: 5076
Implantable Lead Model: 5076
Lead Channel Impedance Value: 507 Ohm
Lead Channel Pacing Threshold Amplitude: 0.8 V
Lead Channel Pacing Threshold Amplitude: 0.8 V
Lead Channel Pacing Threshold Amplitude: 1.2 V
Lead Channel Pacing Threshold Pulse Width: 0.4 ms
Lead Channel Pacing Threshold Pulse Width: 0.4 ms
Lead Channel Pacing Threshold Pulse Width: 0.4 ms
Lead Channel Sensing Intrinsic Amplitude: 19.1 mV
Lead Channel Sensing Intrinsic Amplitude: 7.2 mV
MDC IDC LEAD LOCATION: 753860
MDC IDC MSMT LEADCHNL RA IMPEDANCE VALUE: 429 Ohm
MDC IDC MSMT LEADCHNL RV PACING THRESHOLD AMPLITUDE: 1.2 V
MDC IDC MSMT LEADCHNL RV PACING THRESHOLD PULSEWIDTH: 0.4 ms
MDC IDC MSMT LEADCHNL RV SENSING INTR AMPL: 7.2 mV
MDC IDC PG IMPLANT DT: 20120606
MDC IDC PG SERIAL: 66089338
MDC IDC SESS DTM: 20190821110115
MDC IDC SET LEADCHNL RA PACING AMPLITUDE: 2 V
MDC IDC SET LEADCHNL RV PACING AMPLITUDE: 2.4 V
MDC IDC SET LEADCHNL RV PACING PULSEWIDTH: 0.4 ms

## 2017-11-25 NOTE — Progress Notes (Signed)
Pacemaker check in clinic. Normal device function. Thresholds, sensing, impedances consistent with previous measurements. Device programmed to maximize longevity. 20 mode switches- longest 10seconds, AT. No high ventricular rates noted. Device programmed at appropriate safety margins. Histogram distribution appropriate for patient activity level. Device programmed to optimize intrinsic conduction. Estimated longevity 6y 4m. ROV with Dr. Caryl Comes in February 2020. Cindy [name] reports that she isn't sure if her BP medications need to be adjusted. At home she takes her BP and it is regularly 140-160s/90s.

## 2017-12-02 DIAGNOSIS — M5416 Radiculopathy, lumbar region: Secondary | ICD-10-CM | POA: Diagnosis not present

## 2018-01-14 ENCOUNTER — Other Ambulatory Visit: Payer: Self-pay | Admitting: Nurse Practitioner

## 2018-01-14 DIAGNOSIS — N907 Vulvar cyst: Secondary | ICD-10-CM | POA: Diagnosis not present

## 2018-02-11 DIAGNOSIS — F419 Anxiety disorder, unspecified: Secondary | ICD-10-CM | POA: Diagnosis not present

## 2018-02-11 DIAGNOSIS — N183 Chronic kidney disease, stage 3 (moderate): Secondary | ICD-10-CM | POA: Diagnosis not present

## 2018-02-11 DIAGNOSIS — G894 Chronic pain syndrome: Secondary | ICD-10-CM | POA: Diagnosis not present

## 2018-02-11 DIAGNOSIS — D631 Anemia in chronic kidney disease: Secondary | ICD-10-CM | POA: Diagnosis not present

## 2018-02-11 DIAGNOSIS — E785 Hyperlipidemia, unspecified: Secondary | ICD-10-CM | POA: Diagnosis not present

## 2018-02-11 DIAGNOSIS — I10 Essential (primary) hypertension: Secondary | ICD-10-CM | POA: Diagnosis not present

## 2018-02-22 DIAGNOSIS — I10 Essential (primary) hypertension: Secondary | ICD-10-CM | POA: Diagnosis not present

## 2018-02-22 DIAGNOSIS — E890 Postprocedural hypothyroidism: Secondary | ICD-10-CM | POA: Diagnosis not present

## 2018-02-22 DIAGNOSIS — N189 Chronic kidney disease, unspecified: Secondary | ICD-10-CM | POA: Diagnosis not present

## 2018-02-22 DIAGNOSIS — R7301 Impaired fasting glucose: Secondary | ICD-10-CM | POA: Diagnosis not present

## 2018-02-25 ENCOUNTER — Other Ambulatory Visit: Payer: Self-pay

## 2018-03-09 NOTE — Progress Notes (Signed)
HEMATOLOGY/ONCOLOGY CLINIC NOTE  Date of Service: 03/10/2018   Patient Care Team: Leeroy Cha, MD as PCP - General (Internal Medicine) Deboraha Sprang, MD (Cardiology) Jacelyn Pi, MD (Endocrinology) Juanita Craver, MD (Gastroenterology)  CHIEF COMPLAINTS/PURPOSE OF CONSULTATION:  Follow up for Iron Deficiency Anemia  HISTORY OF PRESENTING ILLNESS:   Cindy [name] is a wonderful 72 y.o. female who has been referred to [place] by Dr Leeroy Cha for evaluation and management of Iron Deficiency Anemia. She was formerly a Musician.   The pt reports that she has been anemic off and on for several years. She notes that she donated blood regularly until the late 1990s when she first became anemic in the setting of a vegetarian diet. She notes that she has been told she was anemic up to 7 different times since the late 1990s. She has had numerous colonoscopies which all revealed no internal bleeding, but her last endoscopy and colonoscopy was 7-8 years ago. She denies any history of blood in the stools. She notes that the last thing she was told regarding her anemia was that she might not be absorbing Iron well. She notes that she takes Vitamin B12 regularly, but denies any concern for B12 deficiency and has taken PO Ferrous sulfate 325mg  once a day since late February. She has been off of Aspirin 81mg  since late February.  She denies having IV iron in the past, and denies receiving blood transfusions in the past.  She notes that she chews ice frequently.   She notes that starting in August 2018 she began reacting to her ace inhibitors with facial and lip swelling, and last presented to the ED for this reason on 05/15/17.  She notes that she has had more fatigue in the last 6 months. She has also been trying to lose weight and has lost 10 pounds in the last couple months.   She sees Dr. Juanita Craver for GI. She sees Dr. Virl Axe in Cardiology.  She received  radioactive iodine for her Graves' disease in 2013. She has taken thyroid replacement since this diagnosis, and is taking 112 mcgs of thyroid replacement currently. She also reports having fibromyalgia.  She denies any history of liver problems.   Most recent lab results (05/15/17) of CBC  is as follows: all values are WNL except for RBC at 3.13, Hgb at 9.1, HCT at 28.4. Ferritin on 05/27/17 was very low at 1 ng/mL.  Her 05/15/17 BMP revealed all values WNL except for BUN at 30.   On review of systems, pt reports expected weight loss, mild leg swelling, and denies fevers, chills, night sweats, nose bleeds, gum bleeds, any other bleeding, mouth sores, abdominal pains, changes in bowel habits, flank pain, noticing any enlarge lymph nodes, and any other symptoms.   On PMHx the pt reports GERD, HTN, Graves' Disease, Syncope with episodic complete AV block, s/p pacemaker insertion 09/2010; Multiple lipomas, pre-diabetes, fibromyalgia. On Social Hx the pt reports Former smoker, quit in 1969.  On Family Hx the pt reports her mother had severe anemia requiring blood transfusions.    INTERVAL HISTORY  Cindy [name] is here for management and evaluation of her IDA. The patient's last visit with [place] was on 09/09/17. The pt reports that she is doing well overall.   The pt reports that she has not developed any new concerns in the interim. She endorses good energy levels, and has not had any GI workup in the interim as was previously  recommended. The pt denies any blood in the stools or black stools. She denies any abdominal pains.   She denies any ice cravings. The pt has followed up with dermatology regarding her previously seen small rash just above her right ankle and has been applying a cream with some noted resolution.   The pt notes that she was taken off of Chlorthalidone within the last couple weeks for concerns of hypokalemia.   The pt notes that she is not consuming beef or pork, but is eating fowl  and fish.   Lab results today (03/10/18) of CBC w/diff is as follows: all values are WNL. 03/10/18 CMP is pending 03/10/18 Ferritin is pending  On review of systems, pt reports good energy levels, regular and normal bowel movements, and denies ice cravings, new skin rashes, blood in the stools, black stools, unexpected weight loss, abdominal pains, and any other symptoms.    MEDICAL HISTORY:  Past Medical History:  Diagnosis Date  . Abnormal nuclear stress test 12/15/2013  . Anemia    hx iron def, no GI loss indentifed - resolved with diet change  . Arthritis   . GERD   . GRAVES' DISEASE 2013   I-131 ablation, now post tx hypothyroid state  . HYPERTENSION   . Impaired glucose tolerance test   . Keloid 12/31/2010   incisional   . Pacemaker -Biotronik-CLS 08/20/2010  . Postablative hypothyroidism    Qualifier: Diagnosis of  By: Joya Gaskins CMA, [place]    . Sciatica   . Syncope 08/20/2010   s/p PPM 08/2010 for sxc pause on loop recorder causing same  . Unspecified vitamin D deficiency     SURGICAL HISTORY: Past Surgical History:  Procedure Laterality Date  . APPENDECTOMY    . BREAST BIOPSY Left 2001   guided excisional bx (L) breast microcalcification at 12 o'clock- Benign  . BUNIONECTOMY Left   . CATARACT EXTRACTION  2014  . LEFT HEART CATHETERIZATION WITH CORONARY ANGIOGRAM N/A 12/22/2013   Procedure: LEFT HEART CATHETERIZATION WITH CORONARY ANGIOGRAM;  Surgeon: Sinclair Grooms, MD;  Location[facility] C[facility] LAB;  Service: Cardiovascular;  Laterality: N/A;  . PACEMAKER INSERTION    . Right ankle  06/2010   Trimalleolar fracture    SOCIAL HISTORY: Social History   Socioeconomic History  . Marital status: Married    Spouse name: Not on file  . Number of children: Not on file  . Years of education: Not on file  . Highest education level: Not on file  Occupational History  . Not on file  Social Needs  . Financial resource strain: Not on file  . Food insecurity:    Worry: Not on file     Inability: Not on file  . Transportation needs:    Medical: Not on file    Non-medical: Not on file  Tobacco Use  . Smoking status: Former Smoker    Types: Cigarettes    Last attempt to quit: 04/08/1967    Years since quitting: 50.9  . Smokeless tobacco: Never Used  Substance and Sexual Activity  . Alcohol use: Yes    Comment: OCCASIONAL  . Drug use: No  . Sexual activity: Not on file  Lifestyle  . Physical activity:    Days per week: Not on file    Minutes per session: Not on file  . Stress: Not on file  Relationships  . Social connections:    Talks on phone: Not on file    Gets together: Not on file  Attends religious service: Not on file    Active member of club or organization: Not on file    Attends meetings of clubs or organizations: Not on file    Relationship status: Not on file  . Intimate partner violence:    Fear of current or ex partner: Not on file    Emotionally abused: Not on file    Physically abused: Not on file    Forced sexual activity: Not on file  Other Topics Concern  . Not on file  Social History Narrative   ** Merged History Encounter **       FAMILY HISTORY: Family History  Problem Relation Age of Onset  . Rheum arthritis Mother   . Emphysema Mother   . Hypertension Mother   . Osteoporosis Mother   . COPD Mother   . Prostate cancer Father   . Hypertension Father   . Heart attack Father   . Osteoporosis Maternal Grandmother   . Stroke Other     ALLERGIES:  is allergic to ace inhibitors; lisinopril; and codeine.  MEDICATIONS:  Current Outpatient Medications  Medication Sig Dispense Refill  . ALPRAZolam (XANAX) 0.5 MG tablet Take 0.5 mg by mouth as needed for anxiety.  0  . amLODipine (NORVASC) 10 MG tablet Take 1 tablet (10 mg total) by mouth daily. 90 tablet 3  . cholecalciferol (VITAMIN D) 1000 UNITS tablet Take 800 Units by mouth daily. Reported on 06/20/2015    . diazepam (VALIUM) 5 MG tablet Take 5 mg by mouth every 6 (six)  hours as needed for anxiety.    . ferrous sulfate 325 (65 FE) MG tablet Take 325 mg by mouth daily with breakfast.    . levothyroxine (SYNTHROID, LEVOTHROID) 112 MCG tablet Take 112 mcg by mouth daily before breakfast.    . loratadine (CLARITIN) 10 MG tablet Take 10 mg by mouth daily as needed for allergies. Reported on 06/20/2015    . Multiple Vitamins-Minerals (MULTIVITAMIN WITH MINERALS) tablet Take 1 tablet by mouth daily.    . naproxen (NAPROSYN) 250 MG tablet Take 250 mg by mouth daily as needed (pain).     . potassium chloride SA (K-DUR,KLOR-CON) 20 MEQ tablet Take 2 tablets (40 mEq total) by mouth daily. Rpt labs with PCP in 3-4 weeks to check potassium levels and adjust dosing. (Patient not taking: Reported on 11/25/2017) 60 tablet 0   No current facility-administered medications for this visit.     REVIEW OF SYSTEMS:    A 10+ POINT REVIEW OF SYSTEMS WAS OBTAINED including neurology, dermatology, psychiatry, cardiac, respiratory, lymph, extremities, GI, GU, Musculoskeletal, constitutional, breasts, reproductive, HEENT.  All pertinent positives are noted in the HPI.  All others are negative.    PHYSICAL EXAMINATION:  Vitals:   03/10/18 1156  BP: (!) 158/87  Pulse: 94  Resp: 18  Temp: 97.9 F (36.6 C)  SpO2: 100%   Filed Weights   03/10/18 1156  Weight: 140 lb 11.2 oz (63.8 kg)   .Body mass index is 25.73 kg/m.  GENERAL:alert, in no acute distress and comfortable SKIN: no acute rashes, no significant lesions EYES: conjunctiva are pink and non-injected, sclera anicteric OROPHARYNX: MMM, no exudates, no oropharyngeal erythema or ulceration NECK: supple, no JVD LYMPH:  no palpable lymphadenopathy in the cervical, axillary or inguinal regions LUNGS: clear to auscultation b/l with normal respiratory effort HEART: regular rate & rhythm ABDOMEN:  normoactive bowel sounds , non tender, not distended. No palpable hepatosplenomegaly.  Extremity: no pedal edema PSYCH: alert &  oriented x 3 with fluent speech NEURO: no focal motor/sensory deficits   LABORATORY DATA:  I have reviewed the data as listed  . CBC Latest Ref Rng & Units 03/10/2018 09/09/2017 07/15/2017  WBC 4.0 - 10.5 K/uL 5.4 4.1 4.4  Hemoglobin 12.0 - 15.0 g/dL 13.8 13.8 13.5  Hematocrit 36.0 - 46.0 % 41.6 41.8 41.3  Platelets 150 - 400 K/uL 294 243 327   . CMP Latest Ref Rng & Units 03/10/2018 09/09/2017 07/15/2017  Glucose 70 - 99 mg/dL 88 109 94  BUN 8 - 23 mg/dL 23 30(H) 25  Creatinine 0.44 - 1.00 mg/dL 0.84 1.05 1.00  Sodium 135 - 145 mmol/L 142 139 140  Potassium 3.5 - 5.1 mmol/L 3.8 3.1(L) 2.5(LL)  Chloride 98 - 111 mmol/L 108 98 97(L)  CO2 22 - 32 mmol/L 23 30(H) 30(H)  Calcium 8.9 - 10.3 mg/dL 9.5 9.6 10.3  Total Protein 6.5 - 8.1 g/dL 7.4 7.3 8.3  Total Bilirubin 0.3 - 1.2 mg/dL 0.8 0.7 0.6  Alkaline Phos 38 - 126 U/L 59 58 67  AST 15 - 41 U/L 17 18 22   ALT 0 - 44 U/L 23 18 26    Component     Latest Ref Rng & Units 05/27/2017  TIBC     250 - 450 ug/dL 450  UIBC     118 - 369 ug/dL 421 (H)  Iron     27 - 139 ug/dL 29  Iron Saturation     15 - 55 % 6 (LL)  Ferritin     15 - 150 ng/mL 1 (L)   . Lab Results  Component Value Date   IRON 84 09/09/2017   TIBC 258 09/09/2017   IRONPCTSAT 33 09/09/2017   (Iron and TIBC)  Lab Results  Component Value Date   FERRITIN 221 09/09/2017     RADIOGRAPHIC STUDIES: I have personally reviewed the radiological images as listed and agreed with the findings in the report. No results found.  ASSESSMENT & PLAN:   72 y.o. female with  1. Normocytic Anemia with severe iron deficiency Workup showed: No hemolysis noted. No M spike Initially Pt is overtly iron deficient with a ferritin of 1, yet interestingly in the setting of a normal MCV on 05/15/17 of 90.7.  hgb improved to 13.8 Ferritin at 221.  2. Severe iron deficiency - ? Related to poor po iron absorption vs GI losses vs vegetarian diet PLAN  -Discussed pt labwork today,  03/10/18; HGB stable at 13.8, all blood counts are normal -03/10/18 Ferritin is pending -I will premedicate her if she needs IV Iron intervention in the future, given mild rash over right ankle, though dermatology did not feel this rash was related to her ferritin infusion- per patient.  -patient will discuss need for additional GI workup with Dr Collene Mares and PCP.  -Recommend PCP track Ferritin once a year -Will see the pt back as needed   RTC with Dr Irene Limbo as needed   All of the patients questions were answered with apparent satisfaction. The patient knows to call the clinic with any problems, questions or concerns.  The total time spent in the appt was 20 minutes and more than 50% was on counseling and direct patient cares.   Sullivan Lone MD [place] AAHIVMS [facility] Hematology/Oncology Physician [facility]  (Office):       [phone] (Work cell):  [phone] (Fax):           [phone]  03/10/2018 12:27  PM  I, Schuyler Bain, am acting as a scribe for Dr. Sullivan Lone.   .I have reviewed the above documentation for accuracy and completeness, and I agree with the above. Brunetta Genera MD

## 2018-03-10 ENCOUNTER — Inpatient Hospital Stay: Payer: PPO

## 2018-03-10 ENCOUNTER — Inpatient Hospital Stay: Payer: PPO | Attending: Hematology | Admitting: Hematology

## 2018-03-10 VITALS — BP 158/87 | HR 94 | Temp 97.9°F | Resp 18 | Ht 62.0 in | Wt 140.7 lb

## 2018-03-10 DIAGNOSIS — M797 Fibromyalgia: Secondary | ICD-10-CM | POA: Diagnosis not present

## 2018-03-10 DIAGNOSIS — Z79899 Other long term (current) drug therapy: Secondary | ICD-10-CM

## 2018-03-10 DIAGNOSIS — I10 Essential (primary) hypertension: Secondary | ICD-10-CM | POA: Diagnosis not present

## 2018-03-10 DIAGNOSIS — K219 Gastro-esophageal reflux disease without esophagitis: Secondary | ICD-10-CM | POA: Diagnosis not present

## 2018-03-10 DIAGNOSIS — D509 Iron deficiency anemia, unspecified: Secondary | ICD-10-CM

## 2018-03-10 DIAGNOSIS — I442 Atrioventricular block, complete: Secondary | ICD-10-CM | POA: Diagnosis not present

## 2018-03-10 DIAGNOSIS — Z791 Long term (current) use of non-steroidal anti-inflammatories (NSAID): Secondary | ICD-10-CM | POA: Diagnosis not present

## 2018-03-10 DIAGNOSIS — E890 Postprocedural hypothyroidism: Secondary | ICD-10-CM | POA: Diagnosis not present

## 2018-03-10 DIAGNOSIS — Z87891 Personal history of nicotine dependence: Secondary | ICD-10-CM | POA: Diagnosis not present

## 2018-03-10 LAB — CBC WITH DIFFERENTIAL/PLATELET
Abs Immature Granulocytes: 0.01 10*3/uL (ref 0.00–0.07)
BASOS ABS: 0.1 10*3/uL (ref 0.0–0.1)
BASOS PCT: 1 %
EOS ABS: 0.1 10*3/uL (ref 0.0–0.5)
Eosinophils Relative: 2 %
HEMATOCRIT: 41.6 % (ref 36.0–46.0)
Hemoglobin: 13.8 g/dL (ref 12.0–15.0)
Immature Granulocytes: 0 %
LYMPHS ABS: 1.7 10*3/uL (ref 0.7–4.0)
Lymphocytes Relative: 31 %
MCH: 30.7 pg (ref 26.0–34.0)
MCHC: 33.2 g/dL (ref 30.0–36.0)
MCV: 92.4 fL (ref 80.0–100.0)
Monocytes Absolute: 0.3 10*3/uL (ref 0.1–1.0)
Monocytes Relative: 5 %
NRBC: 0 % (ref 0.0–0.2)
Neutro Abs: 3.4 10*3/uL (ref 1.7–7.7)
Neutrophils Relative %: 61 %
PLATELETS: 294 10*3/uL (ref 150–400)
RBC: 4.5 MIL/uL (ref 3.87–5.11)
RDW: 13.2 % (ref 11.5–15.5)
WBC: 5.4 10*3/uL (ref 4.0–10.5)

## 2018-03-10 LAB — IRON AND TIBC
Iron: 127 ug/dL (ref 41–142)
SATURATION RATIOS: 47 % (ref 21–57)
TIBC: 269 ug/dL (ref 236–444)
UIBC: 141 ug/dL (ref 120–384)

## 2018-03-10 LAB — CMP (CANCER CENTER ONLY)
ALT: 23 U/L (ref 0–44)
AST: 17 U/L (ref 15–41)
Albumin: 3.9 g/dL (ref 3.5–5.0)
Alkaline Phosphatase: 59 U/L (ref 38–126)
Anion gap: 11 (ref 5–15)
BUN: 23 mg/dL (ref 8–23)
CO2: 23 mmol/L (ref 22–32)
Calcium: 9.5 mg/dL (ref 8.9–10.3)
Chloride: 108 mmol/L (ref 98–111)
Creatinine: 0.84 mg/dL (ref 0.44–1.00)
GFR, Est AFR Am: >60 mL/min (ref >60)
GFR, Estimated: >60 mL/min (ref >60)
Glucose, Bld: 88 mg/dL (ref 70–99)
POTASSIUM: 3.8 mmol/L (ref 3.5–5.1)
Sodium: 142 mmol/L (ref 135–145)
Total Bilirubin: 0.8 mg/dL (ref 0.3–1.2)
Total Protein: 7.4 g/dL (ref 6.5–8.1)

## 2018-03-10 LAB — FERRITIN: Ferritin: 141 ng/mL (ref 11–307)

## 2018-03-11 ENCOUNTER — Telehealth: Payer: Self-pay

## 2018-03-11 NOTE — Telephone Encounter (Signed)
RTC with Dr Irene Limbo as needed. Per 12/4 los

## 2018-04-02 DIAGNOSIS — M19012 Primary osteoarthritis, left shoulder: Secondary | ICD-10-CM | POA: Diagnosis not present

## 2018-04-02 DIAGNOSIS — M67912 Unspecified disorder of synovium and tendon, left shoulder: Secondary | ICD-10-CM | POA: Diagnosis not present

## 2018-04-02 DIAGNOSIS — M79644 Pain in right finger(s): Secondary | ICD-10-CM | POA: Diagnosis not present

## 2018-04-16 DIAGNOSIS — R7301 Impaired fasting glucose: Secondary | ICD-10-CM | POA: Diagnosis not present

## 2018-04-16 DIAGNOSIS — E890 Postprocedural hypothyroidism: Secondary | ICD-10-CM | POA: Diagnosis not present

## 2018-05-04 ENCOUNTER — Other Ambulatory Visit: Payer: Self-pay | Admitting: Podiatry

## 2018-05-04 ENCOUNTER — Ambulatory Visit (INDEPENDENT_AMBULATORY_CARE_PROVIDER_SITE_OTHER): Payer: PPO

## 2018-05-04 ENCOUNTER — Encounter: Payer: Self-pay | Admitting: Podiatry

## 2018-05-04 ENCOUNTER — Ambulatory Visit: Payer: Self-pay | Admitting: Podiatry

## 2018-05-04 ENCOUNTER — Ambulatory Visit (INDEPENDENT_AMBULATORY_CARE_PROVIDER_SITE_OTHER): Payer: PPO | Admitting: Podiatry

## 2018-05-04 DIAGNOSIS — L97521 Non-pressure chronic ulcer of other part of left foot limited to breakdown of skin: Secondary | ICD-10-CM

## 2018-05-04 DIAGNOSIS — M2042 Other hammer toe(s) (acquired), left foot: Secondary | ICD-10-CM

## 2018-05-04 DIAGNOSIS — M19079 Primary osteoarthritis, unspecified ankle and foot: Secondary | ICD-10-CM

## 2018-05-04 DIAGNOSIS — T8484XA Pain due to internal orthopedic prosthetic devices, implants and grafts, initial encounter: Secondary | ICD-10-CM

## 2018-05-05 NOTE — Progress Notes (Signed)
Subjective:  Patient ID: Cindy [name], female    DOB: [birth year],  MRN: [id number] HPI Chief Complaint  Patient presents with  . Toe Pain    2nd toe left - medial and lateral side between toes is sore, previous surgery at metatarsal, feels a knot, rubs 3rd toe causing it to be raw  . New Patient (Initial Visit)    Est pt 3+    73 y.o. female presents with the above complaint.   ROS: Denies fever chills nausea vomiting muscle aches pains calf pain back pain chest pain shortness of breath.  Past Medical History:  Diagnosis Date  . Abnormal nuclear stress test 12/15/2013  . Anemia    hx iron def, no GI loss indentifed - resolved with diet change  . Arthritis   . GERD   . GRAVES' DISEASE 2013   I-131 ablation, now post tx hypothyroid state  . HYPERTENSION   . Impaired glucose tolerance test   . Keloid 12/31/2010   incisional   . Pacemaker -Biotronik-CLS 08/20/2010  . Postablative hypothyroidism    Qualifier: Diagnosis of  By: Joya Gaskins CMA, [place]    . Sciatica   . Syncope 08/20/2010   s/p PPM 08/2010 for sxc pause on loop recorder causing same  . Unspecified vitamin D deficiency    Past Surgical History:  Procedure Laterality Date  . APPENDECTOMY    . BREAST BIOPSY Left 2001   guided excisional bx (L) breast microcalcification at 12 o'clock- Benign  . BUNIONECTOMY Left   . CATARACT EXTRACTION  2014  . LEFT HEART CATHETERIZATION WITH CORONARY ANGIOGRAM N/A 12/22/2013   Procedure: LEFT HEART CATHETERIZATION WITH CORONARY ANGIOGRAM;  Surgeon: Sinclair Grooms, MD;  Location: [facility] CATH LAB;  Service: Cardiovascular;  Laterality: N/A;  . PACEMAKER INSERTION    . Right ankle  06/2010   Trimalleolar fracture    Current Outpatient Medications:  .  ALPRAZolam (XANAX) 0.5 MG tablet, Take 0.5 mg by mouth as needed for anxiety., Disp: , Rfl: 0 .  amLODipine (NORVASC) 10 MG tablet, Take 1 tablet (10 mg total) by mouth daily., Disp: 90 tablet, Rfl: 3 .  cholecalciferol (VITAMIN D) 1000  UNITS tablet, Take 800 Units by mouth daily. Reported on 06/20/2015, Disp: , Rfl:  .  diazepam (VALIUM) 5 MG tablet, Take 5 mg by mouth every 6 (six) hours as needed for anxiety., Disp: , Rfl:  .  ferrous sulfate 325 (65 FE) MG tablet, Take 325 mg by mouth daily with breakfast., Disp: , Rfl:  .  loratadine (CLARITIN) 10 MG tablet, Take 10 mg by mouth daily as needed for allergies. Reported on 06/20/2015, Disp: , Rfl:  .  meloxicam (MOBIC) 15 MG tablet, , Disp: , Rfl:  .  Multiple Vitamins-Minerals (MULTIVITAMIN WITH MINERALS) tablet, Take 1 tablet by mouth daily., Disp: , Rfl:  .  naproxen (NAPROSYN) 250 MG tablet, Take 250 mg by mouth daily as needed (pain). , Disp: , Rfl:  .  potassium chloride (K-DUR) 10 MEQ tablet, 1 TABLET WITH FOOD TWO EVERY 8 HOURS ORALLY 2 DAYS, Disp: , Rfl: 0 .  potassium chloride SA (K-DUR,KLOR-CON) 20 MEQ tablet, Take 2 tablets (40 mEq total) by mouth daily. Rpt labs with PCP in 3-4 weeks to check potassium levels and adjust dosing. (Patient not taking: Reported on 11/25/2017), Disp: 60 tablet, Rfl: 0 .  SYNTHROID 125 MCG tablet, , Disp: , Rfl:   Allergies  Allergen Reactions  . Ace Inhibitors Swelling  . Lisinopril Swelling  angiodema  . Codeine Nausea And Vomiting   Review of Systems Objective:  There were no vitals filed for this visit.  General: Well developed, nourished, in no acute distress, alert and oriented x3   Dermatological: Skin is warm, dry and supple bilateral. Nails x 10 are well maintained; remaining integument appears unremarkable at this time. There are no open sores, no preulcerative lesions, no rash or signs of infection present.  Very small 3 mm superficial ulcerative lesion medial aspect third digit of the left foot.  No purulence no malodor no erythema no signs of infection.   Vascular: Dorsalis Pedis artery and Posterior Tibial artery pedal pulses are 2/4 bilateral with immedate capillary fill time. Pedal hair growth present. No  varicosities and no lower extremity edema present bilateral.   Neruologic: Grossly intact via light touch bilateral. Vibratory intact via tuning fork bilateral. Protective threshold with Semmes Wienstein monofilament intact to all pedal sites bilateral. Patellar and Achilles deep tendon reflexes 2+ bilateral. No Babinski or clonus noted bilateral.   Musculoskeletal: No gross boney pedal deformities bilateral. No pain, crepitus, or limitation noted with foot and ankle range of motion bilateral. Muscular strength 5/5 in all groups tested bilateral.  Gait: Unassisted, Nonantalgic.    Radiographs:  Radiographs demonstrate sec metatarsal osteotomy healed as well as a K wire to the first met.  No screw in the second toe.  No fractures no acute findings.  Assessment & Plan:   Assessment: Osteoarthritic changes and spurring with superficial wound third digit left foot.  Plan: Explained to her that we need to keep the toe separated and that she is lost a considerable amount of fat but beneath the skin that would pad these.  Also instructed her not to wear tight shoes or socks or hose.  Provided her with silicone pads to help prevent irritation further irritation also instructed her to soak in Epsom salts and warm water to help heal the small wound that is present.     Taylour Lietzke T. [place], [place]

## 2018-05-11 ENCOUNTER — Encounter: Payer: Self-pay | Admitting: Internal Medicine

## 2018-06-01 ENCOUNTER — Ambulatory Visit (INDEPENDENT_AMBULATORY_CARE_PROVIDER_SITE_OTHER): Payer: PPO | Admitting: Internal Medicine

## 2018-06-01 ENCOUNTER — Encounter: Payer: Self-pay | Admitting: Internal Medicine

## 2018-06-01 VITALS — BP 134/86 | HR 85 | Ht 62.0 in | Wt 151.6 lb

## 2018-06-01 DIAGNOSIS — Z950 Presence of cardiac pacemaker: Secondary | ICD-10-CM

## 2018-06-01 DIAGNOSIS — I442 Atrioventricular block, complete: Secondary | ICD-10-CM

## 2018-06-01 DIAGNOSIS — I10 Essential (primary) hypertension: Secondary | ICD-10-CM

## 2018-06-01 DIAGNOSIS — D649 Anemia, unspecified: Secondary | ICD-10-CM

## 2018-06-01 DIAGNOSIS — R55 Syncope and collapse: Secondary | ICD-10-CM | POA: Diagnosis not present

## 2018-06-01 NOTE — Patient Instructions (Signed)

## 2018-06-01 NOTE — Progress Notes (Signed)
Patient Care Team: Leeroy Cha, MD as PCP - General (Internal Medicine) Deboraha Sprang, MD (Cardiology) Jacelyn Pi, MD (Endocrinology) Juanita Craver, MD (Gastroenterology)   HPI  Cindy [name] is a 73 y.o. female Seen in follow-up for vasovagal syncope for which she underwent Biotronik CLS pacing 2012. Marland Kitchen   DATE TEST EF   8/15 Myoview   73 % Anterior reversible defect   9/15 LHC   65 % Cors normal          No interval syncope  Some palps   Vague chest pain.  Lasted 3 or 4 hours unrelated to exertion.  Left sided inframammary.  No nausea.  No shortness of breath or diaphoresis.  Earlier this year she had separated from her husband.  She was living with a friend and working out and had lost a good deal of weight.  No symptoms of chest pain or shortness of breath while exercising.  Date Cr K Hgb  2/19 0.87 3.6 9.1.  (MCV 90)  6/19  3.1    12/19 0.84 3.8 13.8    Past Medical History:  Diagnosis Date  . Abnormal nuclear stress test 12/15/2013  . Anemia    hx iron def, no GI loss indentifed - resolved with diet change  . Arthritis   . GERD   . GRAVES' DISEASE 2013   I-131 ablation, now post tx hypothyroid state  . HYPERTENSION   . Impaired glucose tolerance test   . Keloid 12/31/2010   incisional   . Pacemaker -Biotronik-CLS 08/20/2010  . Postablative hypothyroidism    Qualifier: Diagnosis of  By: Joya Gaskins CMA, [place]    . Sciatica   . Syncope 08/20/2010   s/p PPM 08/2010 for sxc pause on loop recorder causing same  . Unspecified vitamin D deficiency     Past Surgical History:  Procedure Laterality Date  . APPENDECTOMY    . BREAST BIOPSY Left 2001   guided excisional bx (L) breast microcalcification at 12 o'clock- Benign  . BUNIONECTOMY Left   . CATARACT EXTRACTION  2014  . LEFT HEART CATHETERIZATION WITH CORONARY ANGIOGRAM N/A 12/22/2013   Procedure: LEFT HEART CATHETERIZATION WITH CORONARY ANGIOGRAM;  Surgeon: Sinclair Grooms, MD;   Location: [facility] CATH LAB;  Service: Cardiovascular;  Laterality: N/A;  . PACEMAKER INSERTION    . Right ankle  06/2010   Trimalleolar fracture    Current Outpatient Medications  Medication Sig Dispense Refill  . ALPRAZolam (XANAX) 0.5 MG tablet Take 0.5 mg by mouth as needed for anxiety.  0  . amLODipine (NORVASC) 10 MG tablet Take 1 tablet (10 mg total) by mouth daily. 90 tablet 3  . cholecalciferol (VITAMIN D) 1000 UNITS tablet Take 800 Units by mouth daily. Reported on 06/20/2015    . diazepam (VALIUM) 5 MG tablet Take 5 mg by mouth every 6 (six) hours as needed for anxiety.    Marland Kitchen loratadine (CLARITIN) 10 MG tablet Take 10 mg by mouth daily as needed for allergies. Reported on 06/20/2015    . meloxicam (MOBIC) 15 MG tablet     . Multiple Vitamins-Minerals (MULTIVITAMIN WITH MINERALS) tablet Take 1 tablet by mouth daily.    . naproxen (NAPROSYN) 250 MG tablet Take 250 mg by mouth daily as needed (pain).     . SYNTHROID 125 MCG tablet Take 125 mcg by mouth daily before breakfast.      No current facility-administered medications for this visit.     Allergies  Allergen Reactions  . Ace Inhibitors Swelling  . Lisinopril Swelling    angiodema  . Codeine Nausea And Vomiting    Review of Systems negative except from HPI and PMH  Physical Exam BP 134/86   Pulse 85   Ht 5\' 2"  (1.575 m)   Wt 151 lb 9.6 oz (68.8 kg)   SpO2 96%   BMI 27.73 kg/m     Well developed and well nourished in no acute distress HENT normal Neck supple with JVP-flat Clear Device pocket well healed; without hematoma or erythema.  There is no tethering  Regular rate and rhythm, no  gallop  murmur Abd-soft with active BS No Clubbing cyanosis  edema Skin-warm and dry A & Oriented  Grossly normal sensory and motor function   ECG atrial paced rhythm at 80 with intrinsic conduction Intervals 21/07/36 PACs with P synchronous pacing   Assessment and  Plan     Vasovagal syncope  Hypertension  Pacemaker -  biotronik  The patient's device was interrogated.  The information was reviewed.     Atrial tachycardia   ACE inhibitor-angioedema     Blood pressure reasonably controlled  Increasing palpitations and ventricular pacing percentages going up in parallel as noted above in the ECG description  Encouraged her to try to make some peace in her life given her young age   We spent more than 50% of our >25 min visit in face to face counseling regarding the above

## 2018-06-02 LAB — CUP PACEART INCLINIC DEVICE CHECK
Implantable Lead Implant Date: 20120606
Implantable Lead Model: 5076
Implantable Lead Model: 5076
Implantable Pulse Generator Implant Date: 20120606
MDC IDC LEAD IMPLANT DT: 20120606
MDC IDC LEAD LOCATION: 753859
MDC IDC LEAD LOCATION: 753860
MDC IDC SESS DTM: 20200226074809
Pulse Gen Serial Number: 66089338

## 2018-06-03 DIAGNOSIS — H04203 Unspecified epiphora, bilateral lacrimal glands: Secondary | ICD-10-CM | POA: Diagnosis not present

## 2018-06-03 DIAGNOSIS — D23112 Other benign neoplasm of skin of right lower eyelid, including canthus: Secondary | ICD-10-CM | POA: Diagnosis not present

## 2018-06-03 DIAGNOSIS — Z9889 Other specified postprocedural states: Secondary | ICD-10-CM | POA: Diagnosis not present

## 2018-06-03 DIAGNOSIS — Z961 Presence of intraocular lens: Secondary | ICD-10-CM | POA: Diagnosis not present

## 2018-06-21 DIAGNOSIS — E890 Postprocedural hypothyroidism: Secondary | ICD-10-CM | POA: Diagnosis not present

## 2018-08-16 ENCOUNTER — Other Ambulatory Visit: Payer: Self-pay | Admitting: Internal Medicine

## 2018-08-16 DIAGNOSIS — D631 Anemia in chronic kidney disease: Secondary | ICD-10-CM | POA: Diagnosis not present

## 2018-08-16 DIAGNOSIS — F419 Anxiety disorder, unspecified: Secondary | ICD-10-CM | POA: Diagnosis not present

## 2018-08-16 DIAGNOSIS — M85859 Other specified disorders of bone density and structure, unspecified thigh: Secondary | ICD-10-CM | POA: Diagnosis not present

## 2018-08-16 DIAGNOSIS — E890 Postprocedural hypothyroidism: Secondary | ICD-10-CM | POA: Diagnosis not present

## 2018-08-16 DIAGNOSIS — Z0000 Encounter for general adult medical examination without abnormal findings: Secondary | ICD-10-CM | POA: Diagnosis not present

## 2018-08-16 DIAGNOSIS — N183 Chronic kidney disease, stage 3 (moderate): Secondary | ICD-10-CM | POA: Diagnosis not present

## 2018-08-16 DIAGNOSIS — E785 Hyperlipidemia, unspecified: Secondary | ICD-10-CM | POA: Diagnosis not present

## 2018-08-16 DIAGNOSIS — G894 Chronic pain syndrome: Secondary | ICD-10-CM | POA: Diagnosis not present

## 2018-08-16 DIAGNOSIS — Z1389 Encounter for screening for other disorder: Secondary | ICD-10-CM | POA: Diagnosis not present

## 2018-08-16 DIAGNOSIS — R5383 Other fatigue: Secondary | ICD-10-CM | POA: Diagnosis not present

## 2018-08-16 DIAGNOSIS — R7303 Prediabetes: Secondary | ICD-10-CM | POA: Diagnosis not present

## 2018-08-16 DIAGNOSIS — I10 Essential (primary) hypertension: Secondary | ICD-10-CM | POA: Diagnosis not present

## 2018-08-16 MED ORDER — AMLODIPINE BESYLATE 10 MG PO TABS: 10.0000 mg | ORAL_TABLET | Freq: Every day | ORAL | 3 refills | Status: DC

## 2018-08-16 NOTE — Telephone Encounter (Signed)
Pt's medication was sent to pt's pharmacy as requested. Confirmation received.  °

## 2018-08-18 DIAGNOSIS — R7303 Prediabetes: Secondary | ICD-10-CM | POA: Diagnosis not present

## 2018-08-18 DIAGNOSIS — E890 Postprocedural hypothyroidism: Secondary | ICD-10-CM | POA: Diagnosis not present

## 2018-08-18 DIAGNOSIS — I10 Essential (primary) hypertension: Secondary | ICD-10-CM | POA: Diagnosis not present

## 2018-08-18 DIAGNOSIS — R5383 Other fatigue: Secondary | ICD-10-CM | POA: Diagnosis not present

## 2018-08-18 DIAGNOSIS — E785 Hyperlipidemia, unspecified: Secondary | ICD-10-CM | POA: Diagnosis not present

## 2018-09-15 DIAGNOSIS — M19012 Primary osteoarthritis, left shoulder: Secondary | ICD-10-CM | POA: Diagnosis not present

## 2018-09-15 DIAGNOSIS — M67912 Unspecified disorder of synovium and tendon, left shoulder: Secondary | ICD-10-CM | POA: Diagnosis not present

## 2018-09-27 DIAGNOSIS — I10 Essential (primary) hypertension: Secondary | ICD-10-CM | POA: Diagnosis not present

## 2018-09-28 DIAGNOSIS — Z1231 Encounter for screening mammogram for malignant neoplasm of breast: Secondary | ICD-10-CM | POA: Diagnosis not present

## 2018-11-22 DIAGNOSIS — I10 Essential (primary) hypertension: Secondary | ICD-10-CM | POA: Diagnosis not present

## 2018-11-22 DIAGNOSIS — R635 Abnormal weight gain: Secondary | ICD-10-CM | POA: Diagnosis not present

## 2018-11-22 DIAGNOSIS — F321 Major depressive disorder, single episode, moderate: Secondary | ICD-10-CM | POA: Diagnosis not present

## 2018-11-25 DIAGNOSIS — H04123 Dry eye syndrome of bilateral lacrimal glands: Secondary | ICD-10-CM | POA: Diagnosis not present

## 2018-11-29 ENCOUNTER — Telehealth: Payer: Self-pay

## 2018-11-29 NOTE — Telephone Encounter (Signed)
    COVID-19 Pre-Screening Questions:  . In the past 7 to 10 days have you had a cough,  shortness of breath, headache, congestion, fever (100 or greater) body aches, chills, sore throat, or sudden loss of taste or sense of smell? No . Have you been around anyone with known Covid 19. No . Have you been around anyone who is awaiting Covid 19 test results in the past 7 to 10 days? No . Have you been around anyone who has been exposed to Covid 19, or has mentioned symptoms of Covid 19 within the past 7 to 10 days? No  If you have any concerns/questions about symptoms patients report during screening (either on the phone or at threshold). Contact the provider seeing the patient or DOD for further guidance.  If neither are available contact a member of the leadership team.         Pt answered no to all covid-19 prescreening questions. I asked the pt to wear a mask if she has one. I let her know we are reducing the number of people coming into the office and if she can physically come alone to please do so. The pt verbalized understanding.

## 2018-11-30 ENCOUNTER — Other Ambulatory Visit: Payer: Self-pay

## 2018-11-30 ENCOUNTER — Ambulatory Visit (INDEPENDENT_AMBULATORY_CARE_PROVIDER_SITE_OTHER): Payer: PPO | Admitting: *Deleted

## 2018-11-30 DIAGNOSIS — R55 Syncope and collapse: Secondary | ICD-10-CM | POA: Diagnosis not present

## 2018-11-30 LAB — CUP PACEART INCLINIC DEVICE CHECK
Battery Remaining Longevity: 69 mo
Brady Statistic RA Percent Paced: 43 %
Brady Statistic RV Percent Paced: 4 %
Date Time Interrogation Session: 20200825123600
Implantable Lead Implant Date: 20120606
Implantable Lead Implant Date: 20120606
Implantable Lead Location: 753859
Implantable Lead Location: 753860
Implantable Lead Model: 5076
Implantable Lead Model: 5076
Implantable Pulse Generator Implant Date: 20120606
Lead Channel Impedance Value: 390 Ohm
Lead Channel Impedance Value: 487 Ohm
Lead Channel Pacing Threshold Amplitude: 0.7 V
Lead Channel Pacing Threshold Amplitude: 0.9 V
Lead Channel Pacing Threshold Pulse Width: 0.4 ms
Lead Channel Pacing Threshold Pulse Width: 0.4 ms
Lead Channel Sensing Intrinsic Amplitude: 15.5 mV
Lead Channel Sensing Intrinsic Amplitude: 5.3 mV
Lead Channel Setting Pacing Amplitude: 2 V
Lead Channel Setting Pacing Amplitude: 2.4 V
Lead Channel Setting Pacing Pulse Width: 0.4 ms
Pulse Gen Serial Number: 66089338

## 2018-11-30 NOTE — Progress Notes (Signed)
Pacemaker check in clinic. Normal device function. Thresholds, sensing, impedances consistent with previous measurements. Device programmed to maximize longevity. At/Af burden 0% EGMs show AT. No high ventricular rates noted. Device programmed at appropriate safety margins. Histogram distribution appropriate for patient activity level. Device programmed to optimize intrinsic conduction. Estimated longevity 5 yrs 7 mos. Patient scheduled clinic ppm check 05/31/19. Patient education completed.

## 2018-12-08 DIAGNOSIS — M19012 Primary osteoarthritis, left shoulder: Secondary | ICD-10-CM | POA: Diagnosis not present

## 2018-12-08 DIAGNOSIS — M75112 Incomplete rotator cuff tear or rupture of left shoulder, not specified as traumatic: Secondary | ICD-10-CM | POA: Diagnosis not present

## 2018-12-09 DIAGNOSIS — M19011 Primary osteoarthritis, right shoulder: Secondary | ICD-10-CM | POA: Diagnosis not present

## 2018-12-09 DIAGNOSIS — M25651 Stiffness of right hip, not elsewhere classified: Secondary | ICD-10-CM | POA: Diagnosis not present

## 2018-12-15 DIAGNOSIS — M19012 Primary osteoarthritis, left shoulder: Secondary | ICD-10-CM | POA: Diagnosis not present

## 2018-12-16 ENCOUNTER — Other Ambulatory Visit: Payer: Self-pay | Admitting: Orthopedic Surgery

## 2018-12-16 ENCOUNTER — Telehealth: Payer: Self-pay | Admitting: *Deleted

## 2018-12-16 DIAGNOSIS — M19012 Primary osteoarthritis, left shoulder: Secondary | ICD-10-CM

## 2018-12-16 NOTE — Telephone Encounter (Signed)
   Primary Cardiologist: Virl Axe, MD  Chart reviewed as part of pre-operative protocol coverage. Patient was contacted 12/16/2018 in reference to pre-operative risk assessment for pending surgery as outlined below.  ULA CATES was last seen on 06/01/2018 by Dr. Caryl Comes.  Since that day, EVALYNNE COMTE has done well from a cardiac standpoint. She has been quite limited in activity due to arthritis and her health center being closed due to COVID-19. She is still able to walk on level ground, go up a flight or two of stairs, and perform ADLs and household chores without chest pain or SOB. She does note a little shortness of breath from time to time which she states feels reminiscent of her prior anemia requiring iron infusions. She plans to have blood work for close monitoring with her PCP prior to surgery. She can easily complete 4 METs without anginal complaints.  Therefore, based on ACC/AHA guidelines, the patient would be at acceptable risk for the planned procedure without further cardiovascular testing.   I will route this recommendation to the requesting party via Epic fax function and remove from pre-op pool.  Please call with questions.  Abigail Butts, PA-C 12/16/2018, 3:13 PM

## 2018-12-16 NOTE — Telephone Encounter (Signed)
Follow up   Per Colletta Maryland the [place] that is going to be used is general.

## 2018-12-16 NOTE — Telephone Encounter (Signed)
   [facility] Pre-operative Risk Assessment    Request for surgical clearance:  1. What type of surgery is being performed? LEFT SHOULDER ARTHROPLASTY    2. When is this surgery scheduled? TBD   3. What type of clearance is required (medical clearance vs. Pharmacy clearance to hold med vs. Both)? MEDICAL  4. Are there any medications that need to be held prior to surgery and how long? NONE LISTED   5. Practice name and name of physician performing surgery? [facility]; DR. Larkin Ina CHANDLER   6. What is your office phone number [phone]    7.   What is your office fax number [phone]  8.   Anesthesia type (None, local, MAC, general) ? LEFT MESSAGE FOR ORTHOPEDIC OFFICE TO CALL BACK WITH ANESTHESIA   Julaine Hua 12/16/2018, 1:40 PM  _________________________________________________________________   (provider comments below)

## 2018-12-17 MED ORDER — AMLODIPINE BESYLATE 5 MG PO TABS: 5.0000 mg | ORAL_TABLET | Freq: Every day | ORAL | 3 refills | Status: DC

## 2018-12-23 ENCOUNTER — Other Ambulatory Visit: Payer: Self-pay

## 2018-12-23 ENCOUNTER — Ambulatory Visit
Admission: RE | Admit: 2018-12-23 | Discharge: 2018-12-23 | Disposition: A | Discharge status: Home / Self Care | Payer: PPO | Source: Ambulatory Visit | Attending: Orthopedic Surgery | Admitting: Orthopedic Surgery

## 2018-12-23 DIAGNOSIS — M19012 Primary osteoarthritis, left shoulder: Secondary | ICD-10-CM

## 2018-12-27 ENCOUNTER — Other Ambulatory Visit: Payer: Self-pay | Admitting: Orthopedic Surgery

## 2019-01-13 NOTE — Patient Instructions (Signed)
DUE TO COVID-19 ONLY ONE VISITOR IS ALLOWED TO COME WITH YOU AND STAY IN THE WAITING ROOM ONLY DURING PRE OP AND PROCEDURE DAY OF SURGERY. THE 1 VISITOR MAY VISIT WITH YOU AFTER SURGERY IN YOUR PRIVATE ROOM DURING VISITING HOURS ONLY!  YOU NEED TO HAVE A COVID 19 TEST ON_______ @_______ , THIS TEST MUST BE DONE BEFORE SURGERY, COME  [address], [place] , [address].  ([place]) ONCE YOUR COVID TEST IS COMPLETED, PLEASE BEGIN THE QUARANTINE INSTRUCTIONS AS OUTLINED IN YOUR HANDOUT.                Cindy [name]  01/13/2019   Your procedure is scheduled on: 01-27-19   Report to [facility] Main  Entrance   Report to  Short stay at        0530 AM     Call this number if you have problems the morning of surgery [phone]    Remember:  NO SOLID FOOD AFTER MIDNIGHT THE NIGHT PRIOR TO SURGERY. NOTHING BY MOUTH EXCEPT CLEAR LIQUIDS UNTIL   0430am . PLEASE FINISH ENSURE DRINK PER SURGEON ORDER  WHICH NEEDS TO BE COMPLETED AT      0430 am then nothing by mouth.    CLEAR LIQUID DIET   Foods Allowed                                                                                 Foods Excluded  Coffee and tea, regular and decaf   No creamer                         liquids that you cannot  Plain Jell-O any favor except red or purple                                           see through such as: Fruit ices (not with fruit pulp)                                                      milk, soups, orange juice  Iced Popsicles                                                       All solid food Carbonated beverages, regular and diet                                    Cranberry, grape and apple juices Sports drinks like Gatorade Lightly seasoned clear broth or consume(fat free) Sugar, honey syrup   _____________________________________________________________________    BRUSH YOUR TEETH MORNING OF SURGERY AND RINSE YOUR MOUTH OUT, NO  CHEWING GUM CANDY OR  MINTS.     Take these medicines the morning of surgery with A SIP OF WATER:   DO NOT TAKE ANY DIABETIC MEDICATIONS DAY OF YOUR SURGERY                               You may not have any metal on your body including hair pins and              piercings  Do not wear jewelry, make-up, lotions, powders or perfumes, deodorant             Do not wear nail polish on your fingernails.  Do not shave  48 hours prior to surgery.              Men may shave face and neck.   Do not bring valuables to the hospital. [place].  Contacts, dentures or bridgework may not be worn into surgery.    _____________________________________________________________________           [facility] - Preparing for Surgery Before surgery, you can play an important role.  Because skin is not sterile, your skin needs to be as free of germs as possible.  You can reduce the number of germs on your skin by washing with CHG (chlorahexidine gluconate) soap before surgery.  CHG is an antiseptic cleaner which kills germs and bonds with the skin to continue killing germs even after washing. Please DO NOT use if you have an allergy to CHG or antibacterial soaps.  If your skin becomes reddened/irritated stop using the CHG and inform your nurse when you arrive at Short Stay. Do not shave (including legs and underarms) for at least 48 hours prior to the first CHG shower.  You may shave your face/neck. Please follow these instructions carefully:  1.  Shower with CHG Soap the night before surgery and the  morning of Surgery.  2.  If you choose to wash your hair, wash your hair first as usual with your  normal  shampoo.  3.  After you shampoo, rinse your hair and body thoroughly to remove the  shampoo.                           4.  Use CHG as you would any other liquid soap.  You can apply chg directly  to the skin and wash                       Gently with a scrungie or clean  washcloth.  5.  Apply the CHG Soap to your body ONLY FROM THE NECK DOWN.   Do not use on face/ open                           Wound or open sores. Avoid contact with eyes, ears mouth and genitals (private parts).                       Wash face,  Genitals (private parts) with your normal soap.             6.  Wash thoroughly, paying special attention to the area where your surgery  will be performed.  7.  Thoroughly rinse your body with warm water from the neck down.  8.  DO NOT shower/wash with your normal soap after using and rinsing off  the CHG Soap.                9.  Pat yourself dry with a clean towel.            10.  Wear clean pajamas.            11.  Place clean sheets on your bed the night of your first shower and do not  sleep with pets. Day of Surgery : Do not apply any lotions/deodorants the morning of surgery.  Please wear clean clothes to the hospital/surgery center.  FAILURE TO FOLLOW THESE INSTRUCTIONS MAY RESULT IN THE CANCELLATION OF YOUR SURGERY PATIENT SIGNATURE_________________________________  NURSE SIGNATURE__________________________________  ________________________________________________________________________   Cindy [name]  An incentive spirometer is a tool that can help keep your lungs clear and active. This tool measures how well you are filling your lungs with each breath. Taking long deep breaths may help reverse or decrease the chance of developing breathing (pulmonary) problems (especially infection) following:  A long period of time when you are unable to move or be active. BEFORE THE PROCEDURE   If the spirometer includes an indicator to show your best effort, your nurse or respiratory therapist will set it to a desired goal.  If possible, sit up straight or lean slightly forward. Try not to slouch.  Hold the incentive spirometer in an upright position. INSTRUCTIONS FOR USE  1. Sit on the edge of your bed if possible, or sit up as far  as you can in bed or on a chair. 2. Hold the incentive spirometer in an upright position. 3. Breathe out normally. 4. Place the mouthpiece in your mouth and seal your lips tightly around it. 5. Breathe in slowly and as deeply as possible, raising the piston or the ball toward the top of the column. 6. Hold your breath for 3-5 seconds or for as long as possible. Allow the piston or ball to fall to the bottom of the column. 7. Remove the mouthpiece from your mouth and breathe out normally. 8. Rest for a few seconds and repeat Steps 1 through 7 at least 10 times every 1-2 hours when you are awake. Take your time and take a few normal breaths between deep breaths. 9. The spirometer may include an indicator to show your best effort. Use the indicator as a goal to work toward during each repetition. 10. After each set of 10 deep breaths, practice coughing to be sure your lungs are clear. If you have an incision (the cut made at the time of surgery), support your incision when coughing by placing a pillow or rolled up towels firmly against it. Once you are able to get out of bed, walk around indoors and cough well. You may stop using the incentive spirometer when instructed by your caregiver.  RISKS AND COMPLICATIONS  Take your time so you do not get dizzy or light-headed.  If you are in pain, you may need to take or ask for pain medication before doing incentive spirometry. It is harder to take a deep breath if you are having pain. AFTER USE  Rest and breathe slowly and easily.  It can be helpful to keep track of a log of your progress. Your caregiver can provide you with a simple table to help with this. If you are using  the spirometer at home, follow these instructions: [place] IF:   You are having difficultly using the spirometer.  You have trouble using the spirometer as often as instructed.  Your pain medication is not giving enough relief while using the spirometer.  You  develop fever of 100.5 F (38.1 C) or higher. SEEK IMMEDIATE MEDICAL CARE IF:   You cough up bloody sputum that had not been present before.  You develop fever of 102 F (38.9 C) or greater.  You develop worsening pain at or near the incision site. MAKE SURE YOU:   Understand these instructions.  Will watch your condition.  Will get help right away if you are not doing well or get worse. Document Released: 08/04/2006 Document Revised: 06/16/2011 Document Reviewed: 10/05/2006 [facility] Patient Information 2014 [place], [place].   ________________________________________________________________________

## 2019-01-17 ENCOUNTER — Other Ambulatory Visit: Payer: Self-pay

## 2019-01-17 ENCOUNTER — Encounter (HOSPITAL_COMMUNITY)
Admission: RE | Admit: 2019-01-17 | Discharge: 2019-01-17 | Disposition: A | Discharge status: Home / Self Care | Payer: PPO | Source: Ambulatory Visit | Attending: Orthopedic Surgery | Admitting: Orthopedic Surgery

## 2019-01-17 ENCOUNTER — Encounter (HOSPITAL_COMMUNITY): Payer: Self-pay

## 2019-01-17 ENCOUNTER — Ambulatory Visit (HOSPITAL_COMMUNITY)
Admission: RE | Admit: 2019-01-17 | Discharge: 2019-01-17 | Disposition: A | Discharge status: Home / Self Care | Payer: PPO | Source: Ambulatory Visit | Attending: Orthopedic Surgery | Admitting: Orthopedic Surgery

## 2019-01-17 DIAGNOSIS — F419 Anxiety disorder, unspecified: Secondary | ICD-10-CM | POA: Diagnosis not present

## 2019-01-17 DIAGNOSIS — Z950 Presence of cardiac pacemaker: Secondary | ICD-10-CM | POA: Insufficient documentation

## 2019-01-17 DIAGNOSIS — Z87891 Personal history of nicotine dependence: Secondary | ICD-10-CM | POA: Diagnosis not present

## 2019-01-17 DIAGNOSIS — Z79899 Other long term (current) drug therapy: Secondary | ICD-10-CM | POA: Diagnosis not present

## 2019-01-17 DIAGNOSIS — Z01818 Encounter for other preprocedural examination: Secondary | ICD-10-CM | POA: Diagnosis not present

## 2019-01-17 DIAGNOSIS — Z01811 Encounter for preprocedural respiratory examination: Secondary | ICD-10-CM

## 2019-01-17 DIAGNOSIS — M19012 Primary osteoarthritis, left shoulder: Secondary | ICD-10-CM | POA: Diagnosis not present

## 2019-01-17 DIAGNOSIS — K449 Diaphragmatic hernia without obstruction or gangrene: Secondary | ICD-10-CM | POA: Diagnosis not present

## 2019-01-17 DIAGNOSIS — I10 Essential (primary) hypertension: Secondary | ICD-10-CM | POA: Diagnosis not present

## 2019-01-17 DIAGNOSIS — E890 Postprocedural hypothyroidism: Secondary | ICD-10-CM | POA: Diagnosis not present

## 2019-01-17 DIAGNOSIS — Z79890 Hormone replacement therapy (postmenopausal): Secondary | ICD-10-CM | POA: Insufficient documentation

## 2019-01-17 HISTORY — DX: Anxiety disorder, unspecified: F41.9

## 2019-01-17 LAB — CBC WITH DIFFERENTIAL/PLATELET
Abs Immature Granulocytes: 0.01 10*3/uL (ref 0.00–0.07)
Basophils Absolute: 0 10*3/uL (ref 0.0–0.1)
Basophils Relative: 1 %
Eosinophils Absolute: 0.1 10*3/uL (ref 0.0–0.5)
Eosinophils Relative: 1 %
HCT: 45.4 % (ref 36.0–46.0)
Hemoglobin: 14.7 g/dL (ref 12.0–15.0)
Immature Granulocytes: 0 %
Lymphocytes Relative: 28 %
Lymphs Abs: 1.5 10*3/uL (ref 0.7–4.0)
MCH: 30.6 pg (ref 26.0–34.0)
MCHC: 32.4 g/dL (ref 30.0–36.0)
MCV: 94.4 fL (ref 80.0–100.0)
Monocytes Absolute: 0.3 10*3/uL (ref 0.1–1.0)
Monocytes Relative: 5 %
Neutro Abs: 3.5 10*3/uL (ref 1.7–7.7)
Neutrophils Relative %: 65 %
Platelets: 287 10*3/uL (ref 150–400)
RBC: 4.81 MIL/uL (ref 3.87–5.11)
RDW: 13 % (ref 11.5–15.5)
WBC: 5.3 10*3/uL (ref 4.0–10.5)
nRBC: 0 % (ref 0.0–0.2)

## 2019-01-17 LAB — COMPREHENSIVE METABOLIC PANEL
ALT: 23 U/L (ref 0–44)
AST: 22 U/L (ref 15–41)
Albumin: 4.4 g/dL (ref 3.5–5.0)
Alkaline Phosphatase: 54 U/L (ref 38–126)
Anion gap: 9 (ref 5–15)
BUN: 22 mg/dL (ref 8–23)
CO2: 26 mmol/L (ref 22–32)
Calcium: 9.4 mg/dL (ref 8.9–10.3)
Chloride: 104 mmol/L (ref 98–111)
Creatinine, Ser: 0.91 mg/dL (ref 0.44–1.00)
GFR calc Af Amer: >60 mL/min (ref >60)
GFR calc non Af Amer: >60 mL/min (ref >60)
Glucose, Bld: 110 mg/dL — ABNORMAL HIGH (ref 70–99)
Potassium: 4.2 mmol/L (ref 3.5–5.1)
Sodium: 139 mmol/L (ref 135–145)
Total Bilirubin: 1.2 mg/dL (ref 0.3–1.2)
Total Protein: 7.9 g/dL (ref 6.5–8.1)

## 2019-01-17 LAB — URINALYSIS, ROUTINE W REFLEX MICROSCOPIC
Bilirubin Urine: NEGATIVE
Glucose, UA: NEGATIVE mg/dL
Hgb urine dipstick: NEGATIVE
Ketones, ur: 5 mg/dL — AB
Leukocytes,Ua: NEGATIVE
Nitrite: NEGATIVE
Protein, ur: NEGATIVE mg/dL
Specific Gravity, Urine: 1.025 (ref 1.005–1.030)
pH: 5 (ref 5.0–8.0)

## 2019-01-17 LAB — SURGICAL PCR SCREEN
MRSA, PCR: NEGATIVE
Staphylococcus aureus: NEGATIVE

## 2019-01-17 LAB — APTT: aPTT: 29 seconds (ref 24–36)

## 2019-01-17 LAB — PROTIME-INR
INR: 0.9 (ref 0.8–1.2)
Prothrombin Time: 12.2 seconds (ref 11.4–15.2)

## 2019-01-17 NOTE — Progress Notes (Signed)
Called  Pacemaker manufacturer phone number [phone] spoke with Darrick Penna they will arrange for someone to be here 01-27-19 to set pacer in asynchronous mode and return to normal programming after procedure. Will have rep call to confirm and recommended having a staff member call day before surgery to confirm since so early in am .

## 2019-01-17 NOTE — Progress Notes (Signed)
Cindy [name] called to confirm he would be the rep coming to set pacer in asynchonous mode and seet it back after surgery. His direct number is [phone] . Recommended by manufacturer call the day before surgery to confirm he is coming.

## 2019-01-17 NOTE — Progress Notes (Addendum)
PCP - Leeroy Cha Cardiologist - [place] dr. Caryl Comes 06-01-18 epic - 12-16-18  Clearance  telephone note  epicKrista Kroeger PA-C   Chest x-ray -  EKG - 06-01-18 epic Stress Test -  ECHO -  Cardiac Cath -   Last device check pacemaker 11-30-18 epic orders on chart  Sleep Study -  CPAP -   Fasting Blood Sugar -  Checks Blood Sugar _____ times a day  Blood Thinner Instructions: Aspirin Instructions: Last Dose:  Anesthesia review:   BP 178/82 at preop pt. Stated her amlodipine had been decreased and was calling her cardiologist Dr. Caryl Comes today and see if he wanted her to increase dose .  Pacemaker in place  Patient denies shortness of breath, fever, cough and chest pain at PAT appointment  none   Patient verbalized understanding of instructions that were given to them at the PAT appointment. Patient was also instructed that they will need to review over the PAT instructions again at home before surgery.

## 2019-01-19 ENCOUNTER — Other Ambulatory Visit: Payer: Self-pay | Admitting: Orthopedic Surgery

## 2019-01-19 DIAGNOSIS — M19012 Primary osteoarthritis, left shoulder: Secondary | ICD-10-CM | POA: Diagnosis not present

## 2019-01-19 NOTE — Progress Notes (Signed)
Anesthesia Chart Review   Case: [id number] Date/Time: 01/27/19 0715   Procedure: TOTAL SHOULDER ARTHROPLASTY (Left Shoulder)   Anesthesia type: Choice   Pre-op diagnosis: LEFT SHOULDER OSTEOARTHRITIS   Location: [place] [facility]   Surgeon: Tania Ade, MD      DISCUSSION:73 y.o. former smoker (quit 04/08/67) with h/o HTN, Graves' disease, vasovagal syncope s/p pacemaker 2012 (last device check 11/30/2018), left shoulder OA scheduled for above procedure 01/26/2001 with Dr. Tania Ade.   Cleared by cardiology 12/16/2018.  Per Roby Lofts, PA-C, "[name] was last seen on 06/01/2018 by Dr. Caryl Comes.  Since that day, ADAOBI BIAGIONI has done well from a cardiac standpoint. She has been quite limited in activity due to arthritis and her health center being closed due to COVID-19. She is still able to walk on level ground, go up a flight or two of stairs, and perform ADLs and household chores without chest pain or SOB. She does note a little shortness of breath from time to time which she states feels reminiscent of her prior anemia requiring iron infusions. She plans to have blood work for close monitoring with her PCP prior to surgery. She can easily complete 4 METs without anginal complaints.  Therefore, based on ACC/AHA guidelines, the patient would be at acceptable risk for the planned procedure without further cardiovascular testing."  Anticipate pt can proceed with planned procedure barring acute status change.   VS: BP (!) 178/82   Pulse 65   Temp 37.3 C (Oral)   Resp 18   Ht 5\' 2"  (1.575 m)   Wt 73.1 kg   SpO2 98%   BMI 29.49 kg/m   PROVIDERS: Leeroy Cha, MD is PCP  Virl Axe, MD is Cardiologist  LABS: Labs reviewed: Acceptable for surgery. (all labs ordered are listed, but only abnormal results are displayed)  Labs Reviewed  COMPREHENSIVE METABOLIC PANEL - Abnormal; Notable for the following components:      Result Value   Glucose, Bld  110 (*)    All other components within normal limits  URINALYSIS, ROUTINE W REFLEX MICROSCOPIC - Abnormal; Notable for the following components:   Ketones, ur 5 (*)    All other components within normal limits  SURGICAL PCR SCREEN  APTT  CBC WITH DIFFERENTIAL/PLATELET  PROTIME-INR  TYPE AND SCREEN     IMAGES: Chest Xray 01/17/2019 FINDINGS: Heart size is normal. Dual lead pacemaker in place. Large hiatal hernia. The pulmonary vascularity is normal. The lungs are clear. No effusions. Chronic degenerative changes affect the shoulders.  IMPRESSION: No active cardiopulmonary disease. Large hiatal hernia. Dual lead pacemaker.  EKG: 06/01/2018 Rate 85 bpm Electronic Pacemaker  CV: Echo 03/28/2010 Study Conclusions   - Left ventricle: The cavity size was normal. Wall thickness was   normal. Systolic function was normal. The estimated ejection   fraction was in the range of 60% to 65%. Wall motion was normal;   there were no regional wall motion abnormalities. Doppler   parameters are consistent with abnormal left ventricular   relaxation (grade 1 diastolic dysfunction).  - Left atrium: The atrium was mildly to moderately dilated.  Past Medical History:  Diagnosis Date  . Abnormal nuclear stress test 12/15/2013  . Anemia    hx iron def, no GI loss indentifed - resolved with diet change    . Anxiety   . Arthritis   . GRAVES' DISEASE 2013   I-131 ablation, now post tx hypothyroid state  . HYPERTENSION   .  Impaired glucose tolerance test   . Keloid 12/31/2010   incisional   . Pacemaker -Biotronik-CLS 08/20/2010   Fainted easily  . Postablative hypothyroidism    Qualifier: Diagnosis of  By: Joya Gaskins CMA, [place]    . Sciatica   . Syncope 08/20/2010   s/p PPM 08/2010 for sxc pause on loop recorder causing same  . Unspecified vitamin D deficiency     Past Surgical History:  Procedure Laterality Date  . APPENDECTOMY    . blephroplasty    . BREAST BIOPSY Left 2001    guided excisional bx (L) breast microcalcification at 12 o'clock- Benign  . BUNIONECTOMY Left   . CATARACT EXTRACTION  2014  . LEFT HEART CATHETERIZATION WITH CORONARY ANGIOGRAM N/A 12/22/2013   Procedure: LEFT HEART CATHETERIZATION WITH CORONARY ANGIOGRAM;  Surgeon: Sinclair Grooms, MD;  Location: [facility] CATH LAB;  Service: Cardiovascular;  Laterality: N/A;  . PACEMAKER INSERTION    . Right ankle  06/2010   Trimalleolar fracture    MEDICATIONS: . ALPRAZolam (XANAX) 0.5 MG tablet  . amLODipine (NORVASC) 5 MG tablet  . cholecalciferol (VITAMIN D) 1000 UNITS tablet  . loratadine (CLARITIN) 10 MG tablet  . Multiple Vitamins-Minerals (MULTIVITAMIN WITH MINERALS) tablet  . PARoxetine (PAXIL) 20 MG tablet  . SYNTHROID 125 MCG tablet  . traMADol (ULTRAM) 50 MG tablet  . vitamin C (ASCORBIC ACID) 500 MG tablet   No current facility-administered medications for this encounter.    Konrad Felix, PA-C [facility] Pre-Surgical Testing [phone] 01/19/19  1:12 PM

## 2019-01-19 NOTE — Anesthesia Preprocedure Evaluation (Addendum)
Anesthesia Evaluation  Patient identified by MRN, date of birth, ID band Patient awake    Reviewed: Allergy & Precautions, NPO status , Patient's Chart, lab work & pertinent test results  History of Anesthesia Complications Negative for: history of anesthetic complications  Airway Mallampati: II  TM Distance: >3 FB Neck ROM: Full    Dental  (+) Dental Advisory Given   Pulmonary neg shortness of breath, neg recent URI, former smoker,    breath sounds clear to auscultation       Cardiovascular hypertension, Pt. on medications (-) angina(-) Past MI and (-) CHF + dysrhythmias + pacemaker  Rhythm:Regular  Biotronik pacer dddr 43% RA pacing   Neuro/Psych PSYCHIATRIC DISORDERS Anxiety  Neuromuscular disease    GI/Hepatic Neg liver ROS,   Endo/Other  Hypothyroidism   Renal/GU negative Renal ROS     Musculoskeletal   Abdominal   Peds  Hematology   Anesthesia Other Findings   Reproductive/Obstetrics                           Anesthesia Physical Anesthesia Plan  ASA: II  Anesthesia Plan: General and Regional   Post-op Pain Management:  Regional for Post-op pain   Induction: Intravenous  PONV Risk Score and Plan: 3 and Ondansetron and Dexamethasone  Airway Management Planned: Oral ETT  Additional Equipment: None  Intra-op Plan:   Post-operative Plan: Extubation in OR  Informed Consent: I have reviewed the patients History and Physical, chart, labs and discussed the procedure including the risks, benefits and alternatives for the proposed anesthesia with the patient or authorized representative who has indicated his/her understanding and acceptance.     Dental advisory given  Plan Discussed with: CRNA and Surgeon  Anesthesia Plan Comments: (See PAT note 01/17/2019, Konrad Felix, PA-C)       Anesthesia Quick Evaluation

## 2019-01-24 ENCOUNTER — Other Ambulatory Visit (HOSPITAL_COMMUNITY)
Admission: RE | Admit: 2019-01-24 | Discharge: 2019-01-24 | Disposition: A | Discharge status: Home / Self Care | Payer: PPO | Source: Ambulatory Visit | Attending: Orthopedic Surgery | Admitting: Orthopedic Surgery

## 2019-01-24 DIAGNOSIS — M25712 Osteophyte, left shoulder: Secondary | ICD-10-CM | POA: Diagnosis present

## 2019-01-24 DIAGNOSIS — Z20828 Contact with and (suspected) exposure to other viral communicable diseases: Secondary | ICD-10-CM | POA: Diagnosis present

## 2019-01-24 DIAGNOSIS — Z471 Aftercare following joint replacement surgery: Secondary | ICD-10-CM | POA: Diagnosis not present

## 2019-01-24 DIAGNOSIS — Z79890 Hormone replacement therapy (postmenopausal): Secondary | ICD-10-CM | POA: Diagnosis not present

## 2019-01-24 DIAGNOSIS — E039 Hypothyroidism, unspecified: Secondary | ICD-10-CM | POA: Diagnosis not present

## 2019-01-24 DIAGNOSIS — Z87891 Personal history of nicotine dependence: Secondary | ICD-10-CM | POA: Diagnosis not present

## 2019-01-24 DIAGNOSIS — Z8249 Family history of ischemic heart disease and other diseases of the circulatory system: Secondary | ICD-10-CM | POA: Diagnosis not present

## 2019-01-24 DIAGNOSIS — E890 Postprocedural hypothyroidism: Secondary | ICD-10-CM | POA: Diagnosis present

## 2019-01-24 DIAGNOSIS — Z885 Allergy status to narcotic agent status: Secondary | ICD-10-CM | POA: Diagnosis not present

## 2019-01-24 DIAGNOSIS — Z79899 Other long term (current) drug therapy: Secondary | ICD-10-CM | POA: Diagnosis not present

## 2019-01-24 DIAGNOSIS — Z96612 Presence of left artificial shoulder joint: Secondary | ICD-10-CM | POA: Diagnosis not present

## 2019-01-24 DIAGNOSIS — I10 Essential (primary) hypertension: Secondary | ICD-10-CM | POA: Diagnosis present

## 2019-01-24 DIAGNOSIS — E559 Vitamin D deficiency, unspecified: Secondary | ICD-10-CM | POA: Diagnosis present

## 2019-01-24 DIAGNOSIS — G8918 Other acute postprocedural pain: Secondary | ICD-10-CM | POA: Diagnosis not present

## 2019-01-24 DIAGNOSIS — M19012 Primary osteoarthritis, left shoulder: Secondary | ICD-10-CM | POA: Diagnosis present

## 2019-01-24 DIAGNOSIS — M25512 Pain in left shoulder: Secondary | ICD-10-CM | POA: Diagnosis present

## 2019-01-24 DIAGNOSIS — F419 Anxiety disorder, unspecified: Secondary | ICD-10-CM | POA: Diagnosis present

## 2019-01-24 DIAGNOSIS — Z888 Allergy status to other drugs, medicaments and biological substances status: Secondary | ICD-10-CM | POA: Diagnosis not present

## 2019-01-24 DIAGNOSIS — Z8261 Family history of arthritis: Secondary | ICD-10-CM | POA: Diagnosis not present

## 2019-01-24 DIAGNOSIS — Z950 Presence of cardiac pacemaker: Secondary | ICD-10-CM | POA: Diagnosis not present

## 2019-01-25 LAB — NOVEL CORONAVIRUS, NAA (HOSP ORDER, SEND-OUT TO REF LAB; TAT 18-24 HRS): SARS-CoV-2, NAA: NOT DETECTED

## 2019-01-27 ENCOUNTER — Other Ambulatory Visit: Payer: Self-pay

## 2019-01-27 ENCOUNTER — Inpatient Hospital Stay (HOSPITAL_COMMUNITY): Payer: PPO

## 2019-01-27 ENCOUNTER — Inpatient Hospital Stay (HOSPITAL_COMMUNITY): Payer: PPO | Admitting: Physician Assistant

## 2019-01-27 ENCOUNTER — Inpatient Hospital Stay (HOSPITAL_COMMUNITY): Payer: PPO | Admitting: Anesthesiology

## 2019-01-27 ENCOUNTER — Encounter (HOSPITAL_COMMUNITY): Payer: Self-pay

## 2019-01-27 ENCOUNTER — Encounter (HOSPITAL_COMMUNITY)
Admission: RE | Disposition: A | Discharge status: Home / Self Care | Payer: Self-pay | Source: Home / Self Care | Attending: Orthopedic Surgery

## 2019-01-27 ENCOUNTER — Inpatient Hospital Stay (HOSPITAL_COMMUNITY)
Admission: RE | Admit: 2019-01-27 | Discharge: 2019-01-28 | DRG: 483 | Disposition: A | Discharge status: Home / Self Care | Payer: PPO | Attending: Orthopedic Surgery | Admitting: Orthopedic Surgery

## 2019-01-27 DIAGNOSIS — F419 Anxiety disorder, unspecified: Secondary | ICD-10-CM | POA: Diagnosis present

## 2019-01-27 DIAGNOSIS — M19012 Primary osteoarthritis, left shoulder: Principal | ICD-10-CM | POA: Diagnosis present

## 2019-01-27 DIAGNOSIS — I10 Essential (primary) hypertension: Secondary | ICD-10-CM | POA: Diagnosis present

## 2019-01-27 DIAGNOSIS — E559 Vitamin D deficiency, unspecified: Secondary | ICD-10-CM | POA: Diagnosis present

## 2019-01-27 DIAGNOSIS — Z79890 Hormone replacement therapy (postmenopausal): Secondary | ICD-10-CM | POA: Diagnosis not present

## 2019-01-27 DIAGNOSIS — Z79899 Other long term (current) drug therapy: Secondary | ICD-10-CM | POA: Diagnosis not present

## 2019-01-27 DIAGNOSIS — Z87891 Personal history of nicotine dependence: Secondary | ICD-10-CM

## 2019-01-27 DIAGNOSIS — Z885 Allergy status to narcotic agent status: Secondary | ICD-10-CM

## 2019-01-27 DIAGNOSIS — Z8261 Family history of arthritis: Secondary | ICD-10-CM

## 2019-01-27 DIAGNOSIS — Z950 Presence of cardiac pacemaker: Secondary | ICD-10-CM | POA: Diagnosis not present

## 2019-01-27 DIAGNOSIS — Z888 Allergy status to other drugs, medicaments and biological substances status: Secondary | ICD-10-CM | POA: Diagnosis not present

## 2019-01-27 DIAGNOSIS — M25512 Pain in left shoulder: Secondary | ICD-10-CM | POA: Diagnosis present

## 2019-01-27 DIAGNOSIS — Z8249 Family history of ischemic heart disease and other diseases of the circulatory system: Secondary | ICD-10-CM | POA: Diagnosis not present

## 2019-01-27 DIAGNOSIS — Z96612 Presence of left artificial shoulder joint: Secondary | ICD-10-CM

## 2019-01-27 DIAGNOSIS — Z20828 Contact with and (suspected) exposure to other viral communicable diseases: Secondary | ICD-10-CM | POA: Diagnosis present

## 2019-01-27 DIAGNOSIS — E890 Postprocedural hypothyroidism: Secondary | ICD-10-CM | POA: Diagnosis present

## 2019-01-27 DIAGNOSIS — M25712 Osteophyte, left shoulder: Secondary | ICD-10-CM | POA: Diagnosis present

## 2019-01-27 HISTORY — PX: TOTAL SHOULDER ARTHROPLASTY: SHX126

## 2019-01-27 LAB — TYPE AND SCREEN
ABO/RH(D): A POS
Antibody Screen: NEGATIVE

## 2019-01-27 SURGERY — ARTHROPLASTY, SHOULDER, TOTAL
Anesthesia: Regional | Site: Shoulder | Laterality: Left

## 2019-01-27 MED ORDER — LACTATED RINGERS IV SOLN
INTRAVENOUS | Status: DC
  Administered 2019-01-27: 06:00:00 via INTRAVENOUS

## 2019-01-27 MED ORDER — CHLORHEXIDINE GLUCONATE 4 % EX LIQD: 60.0000 mL | Freq: Once | CUTANEOUS | Status: DC

## 2019-01-27 MED ORDER — BISACODYL 5 MG PO TBEC: 5.0000 mg | DELAYED_RELEASE_TABLET | Freq: Every day | ORAL | Status: DC | PRN

## 2019-01-27 MED ORDER — SODIUM CHLORIDE 0.9 % IV SOLN
INTRAVENOUS | Status: DC | PRN
  Administered 2019-01-27: 20 ug/min via INTRAVENOUS

## 2019-01-27 MED ORDER — SUGAMMADEX SODIUM 200 MG/2ML IV SOLN
INTRAVENOUS | Status: DC | PRN
  Administered 2019-01-27: 200 mg via INTRAVENOUS

## 2019-01-27 MED ORDER — SODIUM CHLORIDE 0.9 % IR SOLN
Status: DC | PRN
  Administered 2019-01-27 (×2): 1000 mL

## 2019-01-27 MED ORDER — CEFAZOLIN SODIUM-DEXTROSE 1-4 GM/50ML-% IV SOLN
1.0000 g | Freq: Four times a day (QID) | INTRAVENOUS | Status: AC
  Administered 2019-01-27 – 2019-01-28 (×3): 1 g via INTRAVENOUS
  Filled 2019-01-27 (×3): qty 50

## 2019-01-27 MED ORDER — ASPIRIN EC 81 MG PO TBEC
81.0000 mg | DELAYED_RELEASE_TABLET | Freq: Two times a day (BID) | ORAL | Status: DC
  Administered 2019-01-27 – 2019-01-28 (×2): 81 mg via ORAL
  Filled 2019-01-27 (×2): qty 1

## 2019-01-27 MED ORDER — OXYCODONE HCL 5 MG/5ML PO SOLN: 5.0000 mg | Freq: Once | ORAL | Status: DC | PRN

## 2019-01-27 MED ORDER — LIDOCAINE 2% (20 MG/ML) 5 ML SYRINGE
INTRAMUSCULAR | Status: AC
  Filled 2019-01-27: qty 10

## 2019-01-27 MED ORDER — BUPIVACAINE LIPOSOME 1.3 % IJ SUSP
INTRAMUSCULAR | Status: DC | PRN
  Administered 2019-01-27: 133 mg via PERINEURAL

## 2019-01-27 MED ORDER — ACETAMINOPHEN 160 MG/5ML PO SOLN: 1000.0000 mg | Freq: Once | ORAL | Status: DC | PRN

## 2019-01-27 MED ORDER — FENTANYL CITRATE (PF) 100 MCG/2ML IJ SOLN
INTRAMUSCULAR | Status: AC
  Filled 2019-01-27: qty 2

## 2019-01-27 MED ORDER — ONDANSETRON HCL 4 MG/2ML IJ SOLN: 4.0000 mg | Freq: Four times a day (QID) | INTRAMUSCULAR | Status: DC | PRN

## 2019-01-27 MED ORDER — OXYCODONE HCL 5 MG PO TABS: 5.0000 mg | ORAL_TABLET | Freq: Once | ORAL | Status: DC | PRN

## 2019-01-27 MED ORDER — OXYCODONE HCL 5 MG PO TABS: 10.0000 mg | ORAL_TABLET | ORAL | Status: DC | PRN

## 2019-01-27 MED ORDER — OXYCODONE HCL 5 MG PO TABS
5.0000 mg | ORAL_TABLET | ORAL | Status: DC | PRN
  Administered 2019-01-28: 08:00:00 5 mg via ORAL
  Filled 2019-01-27: qty 1

## 2019-01-27 MED ORDER — METHOCARBAMOL 500 MG IVPB - SIMPLE MED
INTRAVENOUS | Status: AC
  Filled 2019-01-27: qty 50

## 2019-01-27 MED ORDER — FENTANYL CITRATE (PF) 100 MCG/2ML IJ SOLN
25.0000 ug | INTRAMUSCULAR | Status: DC | PRN
  Administered 2019-01-27 (×2): 50 ug via INTRAVENOUS

## 2019-01-27 MED ORDER — PHENOL 1.4 % MT LIQD: 1.0000 | OROMUCOSAL | Status: DC | PRN

## 2019-01-27 MED ORDER — ONDANSETRON HCL 4 MG/2ML IJ SOLN
INTRAMUSCULAR | Status: AC
  Filled 2019-01-27: qty 4

## 2019-01-27 MED ORDER — ACETAMINOPHEN 325 MG PO TABS: 325.0000 mg | ORAL_TABLET | Freq: Four times a day (QID) | ORAL | Status: DC | PRN

## 2019-01-27 MED ORDER — MIDAZOLAM HCL 2 MG/2ML IJ SOLN
INTRAMUSCULAR | Status: AC
  Filled 2019-01-27: qty 2

## 2019-01-27 MED ORDER — BUPIVACAINE HCL (PF) 0.5 % IJ SOLN
INTRAMUSCULAR | Status: DC | PRN
  Administered 2019-01-27: 15 mL via PERINEURAL

## 2019-01-27 MED ORDER — SODIUM CHLORIDE 0.9 % IV SOLN
INTRAVENOUS | Status: DC
  Administered 2019-01-27: 20:00:00 via INTRAVENOUS

## 2019-01-27 MED ORDER — FENTANYL CITRATE (PF) 100 MCG/2ML IJ SOLN
INTRAMUSCULAR | Status: DC | PRN
  Administered 2019-01-27 (×2): 50 ug via INTRAVENOUS

## 2019-01-27 MED ORDER — FLEET ENEMA 7-19 GM/118ML RE ENEM: 1.0000 | ENEMA | Freq: Once | RECTAL | Status: DC | PRN

## 2019-01-27 MED ORDER — CEFAZOLIN SODIUM-DEXTROSE 2-4 GM/100ML-% IV SOLN
2.0000 g | INTRAVENOUS | Status: AC
  Administered 2019-01-27: 2 g via INTRAVENOUS
  Filled 2019-01-27: qty 100

## 2019-01-27 MED ORDER — PAROXETINE HCL 20 MG PO TABS
20.0000 mg | ORAL_TABLET | Freq: Every day | ORAL | Status: DC
  Administered 2019-01-28: 20 mg via ORAL
  Filled 2019-01-27: qty 1

## 2019-01-27 MED ORDER — TRANEXAMIC ACID-NACL 1000-0.7 MG/100ML-% IV SOLN
1000.0000 mg | INTRAVENOUS | Status: AC
  Administered 2019-01-27: 1000 mg via INTRAVENOUS
  Filled 2019-01-27: qty 100

## 2019-01-27 MED ORDER — METHOCARBAMOL 500 MG PO TABS
500.0000 mg | ORAL_TABLET | Freq: Four times a day (QID) | ORAL | Status: DC | PRN
  Administered 2019-01-28: 500 mg via ORAL
  Filled 2019-01-27: qty 1

## 2019-01-27 MED ORDER — ZOLPIDEM TARTRATE 5 MG PO TABS: 5.0000 mg | ORAL_TABLET | Freq: Every evening | ORAL | Status: DC | PRN

## 2019-01-27 MED ORDER — AMLODIPINE BESYLATE 10 MG PO TABS
10.0000 mg | ORAL_TABLET | Freq: Every day | ORAL | Status: DC
  Administered 2019-01-28 (×2): 10 mg via ORAL
  Filled 2019-01-27: qty 1

## 2019-01-27 MED ORDER — ALPRAZOLAM 0.25 MG PO TABS: 0.2500 mg | ORAL_TABLET | Freq: Every day | ORAL | Status: DC | PRN

## 2019-01-27 MED ORDER — MENTHOL 3 MG MT LOZG
1.0000 | LOZENGE | OROMUCOSAL | Status: DC | PRN
  Administered 2019-01-28: 3 mg via ORAL
  Filled 2019-01-27: qty 9

## 2019-01-27 MED ORDER — ALUM & MAG HYDROXIDE-SIMETH 200-200-20 MG/5ML PO SUSP: 30.0000 mL | ORAL | Status: DC | PRN

## 2019-01-27 MED ORDER — PHENYLEPHRINE 40 MCG/ML (10ML) SYRINGE FOR IV PUSH (FOR BLOOD PRESSURE SUPPORT)
PREFILLED_SYRINGE | INTRAVENOUS | Status: DC | PRN
  Administered 2019-01-27: 80 ug via INTRAVENOUS

## 2019-01-27 MED ORDER — HYDROMORPHONE HCL 1 MG/ML IJ SOLN: 0.5000 mg | INTRAMUSCULAR | Status: DC | PRN

## 2019-01-27 MED ORDER — ACETAMINOPHEN 10 MG/ML IV SOLN: 1000.0000 mg | Freq: Once | INTRAVENOUS | Status: DC | PRN

## 2019-01-27 MED ORDER — DOCUSATE SODIUM 100 MG PO CAPS
100.0000 mg | ORAL_CAPSULE | Freq: Two times a day (BID) | ORAL | Status: DC
  Administered 2019-01-28: 100 mg via ORAL
  Filled 2019-01-27 (×2): qty 1

## 2019-01-27 MED ORDER — DEXAMETHASONE SODIUM PHOSPHATE 10 MG/ML IJ SOLN
INTRAMUSCULAR | Status: DC | PRN
  Administered 2019-01-27: 8 mg via INTRAVENOUS

## 2019-01-27 MED ORDER — METOCLOPRAMIDE HCL 5 MG/ML IJ SOLN: 5.0000 mg | Freq: Three times a day (TID) | INTRAMUSCULAR | Status: DC | PRN

## 2019-01-27 MED ORDER — DIPHENHYDRAMINE HCL 12.5 MG/5ML PO ELIX: 12.5000 mg | ORAL_SOLUTION | ORAL | Status: DC | PRN

## 2019-01-27 MED ORDER — ROCURONIUM BROMIDE 10 MG/ML (PF) SYRINGE
PREFILLED_SYRINGE | INTRAVENOUS | Status: AC
  Filled 2019-01-27: qty 20

## 2019-01-27 MED ORDER — ROCURONIUM BROMIDE 10 MG/ML (PF) SYRINGE
PREFILLED_SYRINGE | INTRAVENOUS | Status: DC | PRN
  Administered 2019-01-27: 50 mg via INTRAVENOUS

## 2019-01-27 MED ORDER — PHENYLEPHRINE 40 MCG/ML (10ML) SYRINGE FOR IV PUSH (FOR BLOOD PRESSURE SUPPORT)
PREFILLED_SYRINGE | INTRAVENOUS | Status: AC
  Filled 2019-01-27: qty 10

## 2019-01-27 MED ORDER — LIDOCAINE 2% (20 MG/ML) 5 ML SYRINGE
INTRAMUSCULAR | Status: DC | PRN
  Administered 2019-01-27: 50 mg via INTRAVENOUS

## 2019-01-27 MED ORDER — ONDANSETRON HCL 4 MG PO TABS: 4.0000 mg | ORAL_TABLET | Freq: Four times a day (QID) | ORAL | Status: DC | PRN

## 2019-01-27 MED ORDER — ONDANSETRON HCL 4 MG/2ML IJ SOLN
INTRAMUSCULAR | Status: DC | PRN
  Administered 2019-01-27: 4 mg via INTRAVENOUS

## 2019-01-27 MED ORDER — PROPOFOL 10 MG/ML IV BOLUS
INTRAVENOUS | Status: AC
  Filled 2019-01-27: qty 60

## 2019-01-27 MED ORDER — POLYETHYLENE GLYCOL 3350 17 G PO PACK: 17.0000 g | PACK | Freq: Every day | ORAL | Status: DC | PRN

## 2019-01-27 MED ORDER — METHOCARBAMOL 500 MG IVPB - SIMPLE MED
500.0000 mg | Freq: Four times a day (QID) | INTRAVENOUS | Status: DC | PRN
  Administered 2019-01-27: 500 mg via INTRAVENOUS
  Filled 2019-01-27: qty 50

## 2019-01-27 MED ORDER — ACETAMINOPHEN 500 MG PO TABS: 1000.0000 mg | ORAL_TABLET | Freq: Once | ORAL | Status: DC | PRN

## 2019-01-27 MED ORDER — METOCLOPRAMIDE HCL 5 MG PO TABS: 5.0000 mg | ORAL_TABLET | Freq: Three times a day (TID) | ORAL | Status: DC | PRN

## 2019-01-27 MED ORDER — LEVOTHYROXINE SODIUM 125 MCG PO TABS
125.0000 ug | ORAL_TABLET | Freq: Every day | ORAL | Status: DC
  Administered 2019-01-28: 125 ug via ORAL
  Filled 2019-01-27: qty 1

## 2019-01-27 MED ORDER — DEXAMETHASONE SODIUM PHOSPHATE 10 MG/ML IJ SOLN
INTRAMUSCULAR | Status: AC
  Filled 2019-01-27: qty 2

## 2019-01-27 MED ORDER — ACETAMINOPHEN 500 MG PO TABS
1000.0000 mg | ORAL_TABLET | Freq: Four times a day (QID) | ORAL | Status: AC
  Administered 2019-01-27 – 2019-01-28 (×4): 1000 mg via ORAL
  Filled 2019-01-27 (×4): qty 2

## 2019-01-27 MED ORDER — MIDAZOLAM HCL 5 MG/5ML IJ SOLN
INTRAMUSCULAR | Status: DC | PRN
  Administered 2019-01-27 (×2): 1 mg via INTRAVENOUS

## 2019-01-27 MED ORDER — PROPOFOL 10 MG/ML IV BOLUS
INTRAVENOUS | Status: DC | PRN
  Administered 2019-01-27: 120 mg via INTRAVENOUS

## 2019-01-27 SURGICAL SUPPLY — 68 items
AID PSTN UNV HD RSTRNT DISP (MISCELLANEOUS) ×1
APL PRP STRL LF DISP 70% ISPRP (MISCELLANEOUS) ×2
BAG SPEC THK2 15X12 ZIP CLS (MISCELLANEOUS) ×1
BAG ZIPLOCK 12X15 (MISCELLANEOUS) ×2 IMPLANT
BIT DRILL 1.6MX128 (BIT) ×2 IMPLANT
BIT DRILL 2.4X128 (BIT) IMPLANT
BLADE SAW SAG 73X25 THK (BLADE) ×1
BLADE SAW SGTL 18X1.27X75 (BLADE) IMPLANT
BLADE SAW SGTL 73X25 THK (BLADE) ×1 IMPLANT
CEMENT BONE DEPUY (Cement) ×2 IMPLANT
CHLORAPREP W/TINT 26 (MISCELLANEOUS) ×4 IMPLANT
COOLER ICEMAN CLASSIC (MISCELLANEOUS) ×1 IMPLANT
COVER BACK TABLE 60X90IN (DRAPES) ×2 IMPLANT
COVER SURGICAL LIGHT HANDLE (MISCELLANEOUS) ×2 IMPLANT
COVER WAND RF STERILE (DRAPES) IMPLANT
DRAPE INCISE IOBAN 66X45 STRL (DRAPES) ×2 IMPLANT
DRAPE ORTHO SPLIT 77X108 STRL (DRAPES) ×4
DRAPE POUCH INSTRU U-SHP 10X18 (DRAPES) ×2 IMPLANT
DRAPE SURG 17X11 SM STRL (DRAPES) ×2 IMPLANT
DRAPE SURG ORHT 6 SPLT 77X108 (DRAPES) ×2 IMPLANT
DRAPE U-SHAPE 47X51 STRL (DRAPES) ×2 IMPLANT
DRSG AQUACEL AG ADV 3.5X 6 (GAUZE/BANDAGES/DRESSINGS) ×2 IMPLANT
ELECT BLADE TIP CTD 4 INCH (ELECTRODE) ×2 IMPLANT
ELECT REM PT RETURN 15FT ADLT (MISCELLANEOUS) ×2 IMPLANT
GLENOID PEG SHOULDER 40MM SML (Shoulder) IMPLANT
GLOVE BIO SURGEON STRL SZ7 (GLOVE) ×2 IMPLANT
GLOVE BIO SURGEON STRL SZ7.5 (GLOVE) ×2 IMPLANT
GLOVE BIOGEL PI IND STRL 7.0 (GLOVE) ×1 IMPLANT
GLOVE BIOGEL PI IND STRL 8 (GLOVE) ×1 IMPLANT
GLOVE BIOGEL PI INDICATOR 7.0 (GLOVE) ×1
GLOVE BIOGEL PI INDICATOR 8 (GLOVE) ×1
GOWN STRL REUS W/TWL LRG LVL3 (GOWN DISPOSABLE) ×2 IMPLANT
GOWN STRL REUS W/TWL XL LVL3 (GOWN DISPOSABLE) ×2 IMPLANT
GUIDEWIRE GLENOID 2.5X220 (WIRE) ×1 IMPLANT
HANDPIECE INTERPULSE COAX TIP (DISPOSABLE) ×2
HEAD HUM AEQUALIS 43X16 (Head) ×1 IMPLANT
HOOD PEEL AWAY FLYTE STAYCOOL (MISCELLANEOUS) ×6 IMPLANT
KIT BASIN OR (CUSTOM PROCEDURE TRAY) ×2 IMPLANT
KIT TURNOVER KIT A (KITS) IMPLANT
MANIFOLD NEPTUNE II (INSTRUMENTS) ×2 IMPLANT
NDL MA TROC 1/2 CIR (NEEDLE) ×1 IMPLANT
NEEDLE MA TROC 1/2 CIR (NEEDLE) ×2 IMPLANT
NS IRRIG 1000ML POUR BTL (IV SOLUTION) ×2 IMPLANT
PACK SHOULDER (CUSTOM PROCEDURE TRAY) ×2 IMPLANT
PAD COLD SHLDR WRAP-ON (PAD) ×1 IMPLANT
PROTECTOR NERVE ULNAR (MISCELLANEOUS) ×2 IMPLANT
RESTRAINT HEAD UNIVERSAL NS (MISCELLANEOUS) ×2 IMPLANT
RETRIEVER SUT HEWSON (MISCELLANEOUS) ×2 IMPLANT
SET HNDPC FAN SPRY TIP SCT (DISPOSABLE) ×1 IMPLANT
SHOULDER GLENOID PEG 40MM SML (Shoulder) ×2 IMPLANT
SLING ARM IMMOBILIZER LRG (SOFTGOODS) ×2 IMPLANT
SMARTMIX MINI TOWER (MISCELLANEOUS)
SPONGE LAP 18X18 RF (DISPOSABLE) ×2 IMPLANT
STEM HUM AEQUALIS PF SZ1A 66 (Stem) ×1 IMPLANT
STRIP CLOSURE SKIN 1/2X4 (GAUZE/BANDAGES/DRESSINGS) ×2 IMPLANT
SUCTION FRAZIER HANDLE 12FR (TUBING) ×1
SUCTION TUBE FRAZIER 12FR DISP (TUBING) ×1 IMPLANT
SUPPORT WRAP ARM LG (MISCELLANEOUS) ×2 IMPLANT
SUT ETHIBOND 2 V 37 (SUTURE) IMPLANT
SUT MNCRL AB 4-0 PS2 18 (SUTURE) ×2 IMPLANT
SUT VIC AB 2-0 CT1 27 (SUTURE) ×2
SUT VIC AB 2-0 CT1 TAPERPNT 27 (SUTURE) ×1 IMPLANT
TAPE LABRALWHITE 1.5X36 (TAPE) ×2 IMPLANT
TAPE SUT LABRALTAP WHT/BLK (SUTURE) ×2 IMPLANT
TOWEL OR 17X26 10 PK STRL BLUE (TOWEL DISPOSABLE) ×2 IMPLANT
TOWEL OR NON WOVEN STRL DISP B (DISPOSABLE) ×2 IMPLANT
TOWER SMARTMIX MINI (MISCELLANEOUS) IMPLANT
WATER STERILE IRR 1000ML POUR (IV SOLUTION) ×3 IMPLANT

## 2019-01-27 NOTE — Anesthesia Procedure Notes (Signed)
Procedure Name: Intubation Date/Time: 01/27/2019 7:45 AM Performed by: Lavina Hamman, CRNA Pre-anesthesia Checklist: Patient identified, Emergency Drugs available, Suction available, Patient being monitored and Timeout performed Patient Re-evaluated:Patient Re-evaluated prior to induction Oxygen Delivery Method: Circle system utilized Preoxygenation: Pre-oxygenation with 100% oxygen Induction Type: IV induction Ventilation: Mask ventilation without difficulty Laryngoscope Size: Mac and 3 Grade View: Grade I Tube type: Oral Tube size: 7.0 mm Number of attempts: 1 Airway Equipment and Method: Stylet Placement Confirmation: ETT inserted through vocal cords under direct vision,  positive ETCO2,  CO2 detector and breath sounds checked- equal and bilateral Secured at: 21 cm Tube secured with: Tape Dental Injury: Teeth and Oropharynx as per pre-operative assessment

## 2019-01-27 NOTE — Op Note (Signed)
Procedure(s): TOTAL SHOULDER ARTHROPLASTY Procedure Note  Cindy [name] female 73 y.o. 01/27/2019  Procedure(s) and Anesthesia Type:   L TOTAL SHOULDER ARTHROPLASTY - Choice  Surgeon(s) and Role:    Tania Ade, MD - Primary   Indications:  73 y.o. female  With endstage left shoulder arthritis. Pain and dysfunction interfered with quality of life and nonoperative treatment with activity modification, NSAIDS and injections failed.     Surgeon: Isabella Stalling   Assistants: Jeanmarie Hubert PA-C [facility] was present and scrubbed throughout the procedure and was essential in positioning, retraction, exposure, and closure)  Anesthesia: General endotracheal anesthesia with preoperative interscalene block given by the attending anesthesiologist    Procedure Detail  TOTAL SHOULDER ARTHROPLASTY  Findings: Tornier flex anatomic press-fit size 1 stem with a 43 head, cemented size small 40 Cortiloc glenoid.  A lesser tuberosity osteotomy was performed and repaired at the conclusion of the procedure.  Estimated Blood Loss:  200 mL         Drains: None   Blood Given: none          Specimens: none        Complications:  * No complications entered in OR log *         Disposition: PACU - hemodynamically stable.         Condition: stable    Procedure:   The patient was identified in the preoperative holding area where I personally marked the operative extremity after verifying with the patient and consent. She  was taken to the operating room where She was transferred to the   operative table.  The patient received an interscalene block in   the holding area by the attending anesthesiologist.  General anesthesia was induced   in the operating room without complication.  The patient did receive IV  Ancef prior to the commencement of the procedure.  The patient was   placed in the beach-chair position with the back raised about 30   degrees.  The nonoperative  extremity and head and neck were carefully   positioned and padded protecting against neurovascular compromise.  The   left upper extremity was then prepped and draped in the standard sterile   fashion.    The appropriate operative time-out was performed with   Anesthesia, the perioperative staff, as well as myself and we all agreed   that the left side was the correct operative site.  An approximately   10 cm incision was made from the tip of the coracoid to the center point of the   humerus at the level of the axilla.  Dissection was carried down sharply   through subcutaneous tissues and cephalic vein was identified and taken   laterally with the deltoid.  The pectoralis major was taken medially.  The   upper 1 cm of the pectoralis major was released from its attachment on   the humerus.  The clavipectoral fascia was incised just lateral to the   conjoined tendon.  This incision was carried up to but not into the   coracoacromial ligament.  Digital palpation was used to prove   integrity of the axillary nerve which was protected throughout the   procedure.  Musculocutaneous nerve was not palpated in the operative   field.  Conjoined tendon was then retracted gently medially and the   deltoid laterally.  Anterior circumflex humeral vessels were clamped and   coagulated.  The soft tissues overlying the biceps was incised and this  incision was carried across the transverse humeral ligament to the base   of the coracoid.  The biceps was noted to be severely degenerated. It was released from the superior labrum. The biceps was then tenodesed to the soft tissue just above   pectoralis major and the remaining portion of the biceps superiorly was   excised.  An osteotomy was performed at the lesser tuberosity.  The capsule was then   released all the way down to the 6 o'clock position of the humeral head.   The humeral head was then delivered with simultaneous adduction,   extension and  external rotation.  All humeral osteophytes were removed   and the anatomic neck of the humerus was marked and cut free hand at   approximately 25 degrees retroversion within about 3 mm of the cuff   reflection posteriorly.  The head size was estimated to be a 43 medium   offset.  At that point, the humeral head was retracted posteriorly with   a Fukuda retractor.   Remaining portion of the capsule was released at the base of the   coracoid.  The remaining biceps anchor and the entire anterior-inferior   labrum was excised.  The posterior labrum was also excised but the   posterior capsule was not released.  The guidepin was placed bicortically with non-elevated guide.  The reamer was used to ream to concentric bone with punctate bleeding.  This gave an excellent concentric surface.  The center hole was then drilled for an anchor peg glenoid followed by the three peripheral holes and none of the holes   exited the glenoid wall.  I then pulse irrigated these holes and dried   them with Surgicel.  The three peripheral holes were then   pressurized cemented and the anchor peg glenoid was placed and impacted   with an excellent fit.  The glenoid was a 40 S component.  The proximal humerus was then again exposed taking care not to displace the glenoid.    The entry awl was used followed by sounding reamers and then broached for a size 1.  This was then left in place and the calcar planer was used. Trial head was placed with a 43 high offset.  With the trial implantation of the component,  there was approximately 50% posterior translation with immediate snap back to the   anatomic position.  With forward elevation, there was no tendency   towards posterior subluxation.   The trial was removed and the final implant was prepared on a back table.  The trial was removed and the final implant was prepared on a back table.   3 small holes were drilled on the medial side of the lesser tuberosity osteotomy,  through which 2 labral tapes were passed. The implant was then placed through the loop of the 2 labral tapes and impacted with an excellent press-fit. This achieved excellent anatomic reconstruction of the proximal humerus.  The joint was then copiously irrigated with pulse lavage.  The subscapularis and   lesser tuberosity osteotomy were then repaired using the 2 labral tapes previously passed in a double row fashion with horizontal mattress sutures medially brought over through bone tunnels tied over a bone bridge laterally.   One #1 Ethibond was placed at the rotator interval just above   the lesser tuberosity. Copious irrigation was used. Skin was closed with 2-0 Vicryl sutures in the deep dermal layer and 4-0 Monocryl in a subcuticular  running fashion.  Sterile dressings were then applied including Aquacel.  The patient was placed in a sling and allowed to awaken from general anesthesia and taken to the recovery room in stable condition.      POSTOPERATIVE PLAN:  Early passive range of motion will be allowed with the goal of 0 degrees external rotation and 90 degrees forward elevation.  No internal rotation at this time.  No active motion of the arm until the lesser tuberosity heals.  The patient will likely be kept in the hospital for 1-2 days and then discharged home.

## 2019-01-27 NOTE — H&P (Signed)
Cindy [name] is an 73 y.o. female.   Chief Complaint: L shoulder pain and dysfunction HPI: Endstage L shoulder arthritis with significant pain and dysfunction, failed conservative measures.  Pain interferes with sleep and quality of life.   Past Medical History:  Diagnosis Date  . Abnormal nuclear stress test 12/15/2013  . Anemia    hx iron def, no GI loss indentifed - resolved with diet change    . Anxiety   . Arthritis   . GRAVES' DISEASE 2013   I-131 ablation, now post tx hypothyroid state  . HYPERTENSION   . Impaired glucose tolerance test   . Keloid 12/31/2010   incisional   . Pacemaker -Biotronik-CLS 08/20/2010   Fainted easily  . Postablative hypothyroidism    Qualifier: Diagnosis of  By: Joya Gaskins CMA, [place]    . Sciatica   . Syncope 08/20/2010   s/p PPM 08/2010 for sxc pause on loop recorder causing same  . Unspecified vitamin D deficiency     Past Surgical History:  Procedure Laterality Date  . APPENDECTOMY    . blephroplasty    . BREAST BIOPSY Left 2001   guided excisional bx (L) breast microcalcification at 12 o'clock- Benign  . BUNIONECTOMY Left   . CATARACT EXTRACTION  2014  . LEFT HEART CATHETERIZATION WITH CORONARY ANGIOGRAM N/A 12/22/2013   Procedure: LEFT HEART CATHETERIZATION WITH CORONARY ANGIOGRAM;  Surgeon: Sinclair Grooms, MD;  Location: [facility] CATH LAB;  Service: Cardiovascular;  Laterality: N/A;  . PACEMAKER INSERTION    . Right ankle  06/2010   Trimalleolar fracture    Family History  Problem Relation Age of Onset  . Rheum arthritis Mother   . Emphysema Mother   . Hypertension Mother   . Osteoporosis Mother   . COPD Mother   . Prostate cancer Father   . Hypertension Father   . Heart attack Father   . Osteoporosis Maternal Grandmother   . Stroke Other    Social History:  reports that she quit smoking about 51 years ago. Her smoking use included cigarettes. She has never used smokeless tobacco. She reports current alcohol use of about 2.0  standard drinks of alcohol per week. She reports that she does not use drugs.  Allergies:  Allergies  Allergen Reactions  . Ace Inhibitors Swelling  . Lisinopril Swelling    angiodema  . Codeine Nausea And Vomiting    Medications Prior to Admission  Medication Sig Dispense Refill  . ALPRAZolam (XANAX) 0.5 MG tablet Take 0.25 mg by mouth as needed for anxiety.   0  . amLODipine (NORVASC) 5 MG tablet Take 1 tablet (5 mg total) by mouth daily. (Patient taking differently: Take 10 mg by mouth daily. ) 90 tablet 3  . cholecalciferol (VITAMIN D) 1000 UNITS tablet Take 1,000 Units by mouth daily.     . Multiple Vitamins-Minerals (MULTIVITAMIN WITH MINERALS) tablet Take 1 tablet by mouth daily.    Marland Kitchen PARoxetine (PAXIL) 20 MG tablet Take 20 mg by mouth daily.    Marland Kitchen SYNTHROID 125 MCG tablet Take 125 mcg by mouth daily before breakfast.     . traMADol (ULTRAM) 50 MG tablet Take 25 mg by mouth daily as needed.    . vitamin C (ASCORBIC ACID) 500 MG tablet Take 500 mg by mouth daily.    Marland Kitchen loratadine (CLARITIN) 10 MG tablet Take 10 mg by mouth daily as needed for allergies. Reported on 06/20/2015      No results found for this or  any previous visit (from the past 48 hour(s)). No results found.  Review of Systems  All other systems reviewed and are negative.   Blood pressure 129/68, pulse 74, temperature 97.9 F (36.6 C), temperature source Oral, resp. rate (!) 166, height 5\' 2"  (1.575 m), weight 73.1 kg, SpO2 98 %. Physical Exam   Assessment/Plan L shoulder OA, failed conservative tx Plan L TSA Risks / benefits of surgery discussed Consent on chart  NPO for OR Preop antibiotics   Isabella Stalling, MD 01/27/2019, 7:18 AM

## 2019-01-27 NOTE — Progress Notes (Signed)
Pacemaker rep,Chris, is here to adjust pt's pacemaker.

## 2019-01-27 NOTE — Anesthesia Procedure Notes (Addendum)
Anesthesia Regional Block: Axillary brachial plexus block   Pre-Anesthetic Checklist: ,, timeout performed, Correct Patient, Correct Site, Correct Laterality, Correct Procedure, Correct Position, site marked, Risks and benefits discussed,  Surgical consent,  Pre-op evaluation,  At surgeon's request and post-op pain management  Laterality: Left and Upper  Prep: chloraprep       Needles:  Injection technique: Single-shot     Needle Length: 5cm  Needle Gauge: 22     Additional Needles: Arrow StimuQuik ECHO Echogenic Stimulating PNB Needle  Procedures:,,,, ultrasound used (permanent image in chart),,,,  Narrative:  Start time: 01/27/2019 7:18 AM End time: 01/27/2019 7:22 AM Injection made incrementally with aspirations every 5 mL.  Performed by: Personally  Anesthesiologist: Oleta Mouse, MD

## 2019-01-27 NOTE — Plan of Care (Signed)

## 2019-01-27 NOTE — Anesthesia Postprocedure Evaluation (Signed)
Anesthesia Post Note  Patient: Cindy [name]  Procedure(s) Performed: TOTAL SHOULDER ARTHROPLASTY (Left Shoulder)     Patient location during evaluation: PACU Anesthesia Type: Regional and General Level of consciousness: awake and alert Pain management: pain level controlled Vital Signs Assessment: post-procedure vital signs reviewed and stable Respiratory status: spontaneous breathing, nonlabored ventilation, respiratory function stable and patient connected to nasal cannula oxygen Cardiovascular status: blood pressure returned to baseline and stable Postop Assessment: no apparent nausea or vomiting Anesthetic complications: no    Last Vitals:  Vitals:   01/27/19 1015 01/27/19 1045  BP:  130/60  Pulse:  65  Resp: 15 16  Temp: 36.7 C 36.5 C  SpO2:  97%    Last Pain:  Vitals:   01/27/19 0945  TempSrc:   PainSc: Asleep                 Markevion Lattin

## 2019-01-27 NOTE — Transfer of Care (Signed)
Immediate Anesthesia Transfer of Care Note  Patient: Cindy [name]  Procedure(s) Performed: Procedure(s): TOTAL SHOULDER ARTHROPLASTY (Left)  Patient Location: PACU  Anesthesia Type:General  Level of Consciousness:  sedated, patient cooperative and responds to stimulation  Airway & Oxygen Therapy:Patient Spontanous Breathing and Patient connected to face mask oxgen  Post-op Assessment:  Report given to PACU RN and Post -op Vital signs reviewed and stable  Post vital signs:  Reviewed and stable  Last Vitals:  Vitals:   01/27/19 0537 01/27/19 0601  BP: (!) 163/103 129/68  Pulse: 74   Resp: (!) 166   Temp: 36.6 C   SpO2: [id number]     Complications: No apparent anesthesia complications

## 2019-01-27 NOTE — Discharge Instructions (Signed)
Discharge Instructions after Total Shoulder Arthroplasty   . A sling has been provided for you. Remove the sling 5 times each day to perform motion exercises. Use ice on the shoulder intermittently over the first 48 hours after surgery.  . Pain medication has been prescribed for you.  . Use your medication liberally over the first 48 hours, and then begin to taper your use. You may take Extra Strength Tylenol or Tylenol only in place of the pain pills. DO NOT take ANY nonsteroidal anti-inflammatory pain medications: Advil, Motrin, Ibuprofen, Aleve, Naproxen, or Naprosyn. . Take one aspirin a day for 2 weeks after surgery, unless you have an aspirin sensitivity/allergy or asthma. . Leave your dressing on until your first follow up visit.  You may shower with the dressing.  Hold your arm as if you still have your sling on while you shower. . Active reaching and lifting are not permitted. You may use the operative arm for activities of daily living that do not require the operative arm to leave the side of the body, such as eating, drinking, bathing, etc.  . Three to 5 times each day you should perform assisted overhead reaching and external rotation (outward turning) exercises with the operative arm. You were taught these exercises prior to discharge. Both exercises should be done with the non-operative arm used as the "therapist arm" while the operative arm remains relaxed. Ten of each exercise should be done three to five times each day.   Overhead reach is helping to lift your stiff arm up as high as it will go. To stretch your overhead reach, lie flat on your back, relax, and grasp the wrist of the tight shoulder with your opposite hand. Using the power in your opposite arm, bring the stiff arm up as far as it is comfortable. Start holding it for ten seconds and then work up to where you can hold it for a count of 30. Breathe slowly and deeply while the arm is moved. Repeat this stretch ten times,  trying to help the ar up a little higher each time.     External rotation is turning the arm out to the side while your elbow stays close to your body. External rotation is best stretched while you are lying on your back. Hold a cane, yardstick, broom handle, or dowel in both hands. Bend both elbows to a right angle. Use steady, gentle force from your normal arm to rotate the hand of the stiff shoulder out away from your body. Continue the rotation until it is straight in front of you holding it there for a count of 10. Do not go beyond this level of rotation until seen back by Dr. Chandler. Repeat this exercise ten times slowly.      Please call [phone] during normal business hours or [phone] after hours for any problems. Including the following:  - excessive redness of the incisions - drainage for more than 4 days - fever of more than 101.5 F  *Please note that pain medications will not be refilled after hours or on weekends.    

## 2019-01-28 ENCOUNTER — Encounter (HOSPITAL_COMMUNITY): Payer: Self-pay | Admitting: Orthopedic Surgery

## 2019-01-28 LAB — CBC
HCT: 37.9 % (ref 36.0–46.0)
Hemoglobin: 12.4 g/dL (ref 12.0–15.0)
MCH: 31.2 pg (ref 26.0–34.0)
MCHC: 32.7 g/dL (ref 30.0–36.0)
MCV: 95.2 fL (ref 80.0–100.0)
Platelets: 258 10*3/uL (ref 150–400)
RBC: 3.98 MIL/uL (ref 3.87–5.11)
RDW: 13 % (ref 11.5–15.5)
WBC: 8.5 10*3/uL (ref 4.0–10.5)
nRBC: 0 % (ref 0.0–0.2)

## 2019-01-28 LAB — BASIC METABOLIC PANEL
Anion gap: 8 (ref 5–15)
BUN: 19 mg/dL (ref 8–23)
CO2: 22 mmol/L (ref 22–32)
Calcium: 8.3 mg/dL — ABNORMAL LOW (ref 8.9–10.3)
Chloride: 108 mmol/L (ref 98–111)
Creatinine, Ser: 0.81 mg/dL (ref 0.44–1.00)
GFR calc Af Amer: >60 mL/min (ref >60)
GFR calc non Af Amer: >60 mL/min (ref >60)
Glucose, Bld: 128 mg/dL — ABNORMAL HIGH (ref 70–99)
Potassium: 3.8 mmol/L (ref 3.5–5.1)
Sodium: 138 mmol/L (ref 135–145)

## 2019-01-28 MED ORDER — TIZANIDINE HCL 4 MG PO TABS: 4.0000 mg | ORAL_TABLET | Freq: Three times a day (TID) | ORAL | 1 refills | Status: DC | PRN

## 2019-01-28 MED ORDER — OXYCODONE-ACETAMINOPHEN 5-325 MG PO TABS: ORAL_TABLET | ORAL | 0 refills | Status: DC

## 2019-01-28 NOTE — Discharge Summary (Signed)
Patient ID: Cindy [name] MRN: [id number] DOB/AGE: 1945-07-29 73 y.o.  Admit date: 01/27/2019 Discharge date: 01/28/2019  Admission Diagnoses:  Active Problems:   Status post total shoulder arthroplasty, left   Discharge Diagnoses:  Same  Past Medical History:  Diagnosis Date  . Abnormal nuclear stress test 12/15/2013  . Anemia    hx iron def, no GI loss indentifed - resolved with diet change    . Anxiety   . Arthritis   . GRAVES' DISEASE 2013   I-131 ablation, now post tx hypothyroid state  . HYPERTENSION   . Impaired glucose tolerance test   . Keloid 12/31/2010   incisional   . Pacemaker -Biotronik-CLS 08/20/2010   Fainted easily  . Postablative hypothyroidism    Qualifier: Diagnosis of  By: Joya Gaskins CMA, [place]    . Sciatica   . Syncope 08/20/2010   s/p PPM 08/2010 for sxc pause on loop recorder causing same  . Unspecified vitamin D deficiency     Surgeries: Procedure(s): TOTAL SHOULDER ARTHROPLASTY on 01/27/2019   Consultants:   Discharged Condition: Improved  Hospital Course: Cindy [name] is an 73 y.o. female who was admitted 01/27/2019 for operative treatment of left shoulder end stage OA. Patient has severe unremitting pain that affects sleep, daily activities, and work/hobbies. After pre-op clearance the patient was taken to the operating room on 01/27/2019 and underwent  Procedure(s): TOTAL SHOULDER ARTHROPLASTY.    Patient was given perioperative antibiotics:  Anti-infectives (From admission, onward)   Start     Dose/Rate Route Frequency Ordered Stop   01/27/19 1330  ceFAZolin (ANCEF) IVPB 1 g/50 mL premix     1 g 100 mL/hr over 30 Minutes Intravenous Every 6 hours 01/27/19 1158 01/28/19 0112   01/27/19 0600  ceFAZolin (ANCEF) IVPB 2g/100 mL premix     2 g 200 mL/hr over 30 Minutes Intravenous On call to O.R. 01/27/19 [id number] 01/27/19 0735       Patient was given sequential compression devices, early ambulation, and chemoprophylaxis to prevent  DVT.  Patient benefited maximally from hospital stay and there were no complications.    Recent vital signs:  Patient Vitals for the past 24 hrs:  BP Temp Temp src Pulse Resp SpO2  01/28/19 0823 (!) 152/60 - - - - -  01/28/19 0516 138/71 97.7 F (36.5 C) Oral 66 14 97 %  01/28/19 0129 136/71 97.7 F (36.5 C) Oral 66 14 96 %  01/27/19 2123 (!) 151/72 97.7 F (36.5 C) Oral (!) 58 18 99 %  01/27/19 1355 (!) 115/56 97.9 F (36.6 C) Oral 74 - 97 %  01/27/19 1255 123/60 97.9 F (36.6 C) Oral 64 - 96 %  01/27/19 1143 120/66 - - 65 17 96 %  01/27/19 1045 130/60 97.7 F (36.5 C) - 65 16 97 %  01/27/19 1015 - 98 F (36.7 C) - - 15 -  01/27/19 1000 114/64 - - 64 15 98 %  01/27/19 0930 116/70 - - 63 15 95 %  01/27/19 0909 (!) 146/83 97.8 F (36.6 C) - 61 15 98 %     Recent laboratory studies:  Recent Labs    01/28/19 0247  WBC 8.5  HGB 12.4  HCT 37.9  PLT 258  NA 138  K 3.8  CL 108  CO2 22  BUN 19  CREATININE 0.81  GLUCOSE 128*  CALCIUM 8.3*     Discharge Medications:   Allergies as of 01/28/2019      Reactions  Ace Inhibitors Swelling   Lisinopril Swelling   angiodema   Codeine Nausea And Vomiting      Medication List    TAKE these medications   ALPRAZolam 0.5 MG tablet Commonly known as: XANAX Take 0.25 mg by mouth as needed for anxiety.   amLODipine 5 MG tablet Commonly known as: NORVASC Take 1 tablet (5 mg total) by mouth daily. What changed: how much to take   cholecalciferol 1000 units tablet Commonly known as: VITAMIN D Take 1,000 Units by mouth daily.   loratadine 10 MG tablet Commonly known as: CLARITIN Take 10 mg by mouth daily as needed for allergies. Reported on 06/20/2015   multivitamin with minerals tablet Take 1 tablet by mouth daily.   oxyCODONE-acetaminophen 5-325 MG tablet Commonly known as: Percocet Take 1-2 tablets every 4 hours as needed for post operative pain. MAX 6/day   PARoxetine 20 MG tablet Commonly known as:  PAXIL Take 20 mg by mouth daily.   Synthroid 125 MCG tablet Generic drug: levothyroxine Take 125 mcg by mouth daily before breakfast.   tiZANidine 4 MG tablet Commonly known as: Zanaflex Take 1 tablet (4 mg total) by mouth every 8 (eight) hours as needed for muscle spasms.   traMADol 50 MG tablet Commonly known as: ULTRAM Take 25 mg by mouth daily as needed.   vitamin C 500 MG tablet Commonly known as: ASCORBIC ACID Take 500 mg by mouth daily.       Diagnostic Studies: Dg Chest 2 View  Result Date: 01/17/2019 CLINICAL DATA:  Preoperative evaluation for shoulder replacement. EXAM: CHEST - 2 VIEW COMPARISON:  05/15/2017 FINDINGS: Heart size is normal. Dual lead pacemaker in place. Large hiatal hernia. The pulmonary vascularity is normal. The lungs are clear. No effusions. Chronic degenerative changes affect the shoulders. IMPRESSION: No active cardiopulmonary disease. Large hiatal hernia. Dual lead pacemaker. Electronically Signed   By: Nelson Chimes M.D.   On: 01/17/2019 17:15   Dg Shoulder Left Port  Result Date: 01/27/2019 CLINICAL DATA:  Status post left shoulder replacement today. EXAM: LEFT SHOULDER - 1 VIEW COMPARISON:  CT left shoulder 12/23/2018. FINDINGS: Left shoulder arthroplasty is in place. The device appears located. Lucency through the base of the acromion is favored to represent overlying soft tissue gas but attention is recommended on follow-up examinations. IMPRESSION: Status post left shoulder replacement. Lucency through the base of the acromion is likely due to overlying soft tissue gas but recommend attention on follow-up examinations for nondisplaced fracture. Electronically Signed   By: Inge Rise M.D.   On: 01/27/2019 09:41    Disposition: Discharge disposition: 01-Home or Self Care       Discharge Instructions    Call MD / Call 911   Complete by: As directed    If you experience chest pain or shortness of breath, CALL 911 and be transported to  the hospital emergency room.  If you develope a fever above 101 F, pus (white drainage) or increased drainage or redness at the wound, or calf pain, call your surgeon's office.   Constipation Prevention   Complete by: As directed    Drink plenty of fluids.  Prune juice may be helpful.  You may use a stool softener, such as Colace (over the counter) 100 mg twice a day.  Use MiraLax (over the counter) for constipation as needed.   Diet - low sodium heart healthy   Complete by: As directed    Increase activity slowly as tolerated   Complete by:  As directed       Follow-up Information    Tania Ade, MD. Schedule an appointment as soon as possible for a visit in 2 weeks.   Specialty: Orthopedic Surgery Contact information: [address] [place] [address] [phone]            Signed: Grier Mitts 01/28/2019, 8:58 AM

## 2019-01-28 NOTE — Progress Notes (Signed)
   PATIENT ID: Cindy [name]   1 Day Post-Op Procedure(s) (LRB): TOTAL SHOULDER ARTHROPLASTY (Left)  Subjective: Doing great this am. Some pain left shoulder but due for pain rx. No other complaints or concerns.   Objective:  Vitals:   01/28/19 0516 01/28/19 0823  BP: 138/71 (!) 152/60  Pulse: 66   Resp: 14   Temp: 97.7 F (36.5 C)   SpO2: 97%      L shoulder dressing c/d/i Wiggles fingers  Labs:  Recent Labs    01/28/19 0247  HGB 12.4   Recent Labs    01/28/19 0247  WBC 8.5  RBC 3.98  HCT 37.9  PLT 258   Recent Labs    01/28/19 0247  NA 138  K 3.8  CL 108  CO2 22  BUN 19  CREATININE 0.81  GLUCOSE 128*  CALCIUM 8.3*    Assessment and Plan: 1 day s/p L TSA OT- PROM goal to 90FF 0 ER D/c home when cleared by OT today Fu with Dr. Tamera Punt in 2 weeks VTE proph: asa, scds

## 2019-01-28 NOTE — Plan of Care (Signed)
  Problem: Education: °Goal: Knowledge of General Education information will improve °Description: Including pain rating scale, medication(s)/side effects and non-pharmacologic comfort measures °Outcome: Progressing °  °Problem: Clinical Measurements: °Goal: Will remain free from infection °Outcome: Progressing °Goal: Respiratory complications will improve °Outcome: Progressing °Goal: Cardiovascular complication will be avoided °Outcome: Progressing °  °Problem: Coping: °Goal: Level of anxiety will decrease °Outcome: Progressing °  °

## 2019-01-28 NOTE — Evaluation (Addendum)
Occupational Therapy Evaluation Patient Details Name: Cindy [name] MRN: [id number] DOB: Aug 12, 1945 Today's Date: 01/28/2019    History of Present Illness 73 year old female was admitted for  L TSA.   Clinical Impression   Pt was admitted for the above. At baseline, she is independent with adls. She reports that she does push up from surfaces with LUE but has been practicing not using this. She knows she is NWB.  All education was completed. Performed adl and exercises. Pt will follow up with Dr Tamera Punt    Follow Up Recommendations  Follow surgeon's recommendation for DC plan and follow-up therapies    Equipment Recommendations  None recommended by OT(doesn't want toilet dme)    Recommendations for Other Services       Precautions / Restrictions Precautions Precautions: Shoulder Type of Shoulder Precautions: sling on at all times except for adls and exercises.  May perform PROM 0-90 FF and to neutral rotation. NO ABD.  May move fingers wrist and elbow Shoulder Interventions: Shoulder sling/immobilizer Precaution Booklet Issued: Yes (comment) Restrictions Weight Bearing Restrictions: Yes LUE Weight Bearing: Non weight bearing      Mobility Bed Mobility               General bed mobility comments: min A for OOB; pt plans to sleep in recliner  Transfers                 General transfer comment: supervision to stand; min guard to walk for safety; no LOB    Balance                                           ADL either performed or assessed with clinical judgement   ADL Overall ADL's : Needs assistance/impaired Eating/Feeding: Set up   Grooming: Minimal assistance   Upper Body Bathing: Moderate assistance   Lower Body Bathing: Moderate assistance   Upper Body Dressing : Maximal assistance   Lower Body Dressing: Moderate assistance   Toilet Transfer: Min guard;Ambulation;Comfort height toilet Toilet Transfer Details (indicate  cue type and reason): for safety Toileting- Clothing Manipulation and Hygiene: Minimal assistance         General ADL Comments: performed ADL in bathroom; cues not to move LUE and also cues to relax into sling. Educated on protocol, precautions, adls and permitted exercises.      Vision         Perception     Praxis      Pertinent Vitals/Pain Pain Assessment: Faces Faces Pain Scale: Hurts little more Pain Location: L shoulder Pain Descriptors / Indicators: Aching Pain Intervention(s): Limited activity within patient's tolerance;Monitored during session;Premedicated before session;Repositioned;Ice applied     Hand Dominance Right   Extremity/Trunk Assessment Upper Extremity Assessment Upper Extremity Assessment: LUE deficits/detail LUE Deficits / Details: block not in effect per pt. Able to move fingers, wrist and some elbow.  Performed PROM FF to 30 and out of full IR about 30           Communication Communication Communication: No difficulties   Cognition Arousal/Alertness: Awake/alert Behavior During Therapy: WFL for tasks assessed/performed Overall Cognitive Status: Within Functional Limits for tasks assessed                                     General  Comments       Exercises     Shoulder Instructions      Home Living Family/patient expects to be discharged to:: Private residence Living Arrangements: Spouse/significant other                               Additional Comments: Pt has been practicing getting off of toilet without using LUE.  She does not want DME for toilet.  Walk in shower      Prior Functioning/Environment Level of Independence: Independent                 OT Problem List:        OT Treatment/Interventions:      OT Goals(Current goals can be found in the care plan section) Acute Rehab OT Goals OT Goal Formulation: All assessment and education complete, DC therapy  OT Frequency:     Barriers  to D/C:            Co-evaluation              AM-PAC OT "6 Clicks" Daily Activity     Outcome Measure Help from another person eating meals?: A Little Help from another person taking care of personal grooming?: A Little Help from another person toileting, which includes using toliet, bedpan, or urinal?: A Little Help from another person bathing (including washing, rinsing, drying)?: A Lot Help from another person to put on and taking off regular upper body clothing?: A Lot Help from another person to put on and taking off regular lower body clothing?: A Lot 6 Click Score: 15   End of Session Nurse Communication: (finished with OT)  Activity Tolerance: Patient tolerated treatment well Patient left: in chair;with call bell/phone within reach  OT Visit Diagnosis: Muscle weakness (generalized) (M62.81);Pain Pain - Right/Left: Left                Time: [id number] OT Time Calculation (min): 52 min Charges:  OT General Charges $OT Visit: 1 Visit OT Evaluation $OT Eval Low Complexity: 1 Low OT Treatments $Self Care/Home Management : 8-22 mins $Therapeutic Exercise: 8-22 mins  Lesle Chris, OTR/L Acute Rehabilitation Services [phone] [facility] pager [phone] office 01/28/2019  Nitin Mckowen 01/28/2019, 11:37 AM

## 2019-02-01 ENCOUNTER — Emergency Department (HOSPITAL_COMMUNITY): Payer: PPO

## 2019-02-01 ENCOUNTER — Encounter (HOSPITAL_COMMUNITY): Payer: Self-pay | Admitting: Emergency Medicine

## 2019-02-01 ENCOUNTER — Other Ambulatory Visit: Payer: Self-pay

## 2019-02-01 ENCOUNTER — Emergency Department (HOSPITAL_COMMUNITY)
Admission: EM | Admit: 2019-02-01 | Discharge: 2019-02-02 | Disposition: A | Discharge status: Home / Self Care | Payer: PPO | Attending: Emergency Medicine | Admitting: Emergency Medicine

## 2019-02-01 DIAGNOSIS — Z96612 Presence of left artificial shoulder joint: Secondary | ICD-10-CM | POA: Diagnosis not present

## 2019-02-01 DIAGNOSIS — E039 Hypothyroidism, unspecified: Secondary | ICD-10-CM | POA: Diagnosis not present

## 2019-02-01 DIAGNOSIS — R58 Hemorrhage, not elsewhere classified: Secondary | ICD-10-CM | POA: Diagnosis not present

## 2019-02-01 DIAGNOSIS — M7989 Other specified soft tissue disorders: Secondary | ICD-10-CM

## 2019-02-01 DIAGNOSIS — Z79899 Other long term (current) drug therapy: Secondary | ICD-10-CM | POA: Diagnosis not present

## 2019-02-01 DIAGNOSIS — R635 Abnormal weight gain: Secondary | ICD-10-CM | POA: Diagnosis not present

## 2019-02-01 DIAGNOSIS — I10 Essential (primary) hypertension: Secondary | ICD-10-CM | POA: Diagnosis not present

## 2019-02-01 DIAGNOSIS — R2232 Localized swelling, mass and lump, left upper limb: Secondary | ICD-10-CM | POA: Insufficient documentation

## 2019-02-01 DIAGNOSIS — Z87891 Personal history of nicotine dependence: Secondary | ICD-10-CM | POA: Diagnosis not present

## 2019-02-01 DIAGNOSIS — M25512 Pain in left shoulder: Secondary | ICD-10-CM | POA: Diagnosis not present

## 2019-02-01 DIAGNOSIS — R079 Chest pain, unspecified: Secondary | ICD-10-CM | POA: Diagnosis not present

## 2019-02-01 DIAGNOSIS — Z950 Presence of cardiac pacemaker: Secondary | ICD-10-CM | POA: Diagnosis not present

## 2019-02-01 LAB — CBC WITH DIFFERENTIAL/PLATELET
Abs Immature Granulocytes: 0.02 10*3/uL (ref 0.00–0.07)
Basophils Absolute: 0 10*3/uL (ref 0.0–0.1)
Basophils Relative: 1 %
Eosinophils Absolute: 0.1 10*3/uL (ref 0.0–0.5)
Eosinophils Relative: 1 %
HCT: 37.1 % (ref 36.0–46.0)
Hemoglobin: 12.1 g/dL (ref 12.0–15.0)
Immature Granulocytes: 0 %
Lymphocytes Relative: 23 %
Lymphs Abs: 1.3 10*3/uL (ref 0.7–4.0)
MCH: 30.6 pg (ref 26.0–34.0)
MCHC: 32.6 g/dL (ref 30.0–36.0)
MCV: 93.9 fL (ref 80.0–100.0)
Monocytes Absolute: 0.3 10*3/uL (ref 0.1–1.0)
Monocytes Relative: 6 %
Neutro Abs: 4 10*3/uL (ref 1.7–7.7)
Neutrophils Relative %: 69 %
Platelets: 283 10*3/uL (ref 150–400)
RBC: 3.95 MIL/uL (ref 3.87–5.11)
RDW: 13 % (ref 11.5–15.5)
WBC: 5.7 10*3/uL (ref 4.0–10.5)
nRBC: 0 % (ref 0.0–0.2)

## 2019-02-01 LAB — COMPREHENSIVE METABOLIC PANEL
ALT: 40 U/L (ref 0–44)
AST: 31 U/L (ref 15–41)
Albumin: 3.8 g/dL (ref 3.5–5.0)
Alkaline Phosphatase: 90 U/L (ref 38–126)
Anion gap: 15 (ref 5–15)
BUN: 12 mg/dL (ref 8–23)
CO2: 23 mmol/L (ref 22–32)
Calcium: 9 mg/dL (ref 8.9–10.3)
Chloride: 101 mmol/L (ref 98–111)
Creatinine, Ser: 0.85 mg/dL (ref 0.44–1.00)
GFR calc Af Amer: >60 mL/min (ref >60)
GFR calc non Af Amer: >60 mL/min (ref >60)
Glucose, Bld: 98 mg/dL (ref 70–99)
Potassium: 3.3 mmol/L — ABNORMAL LOW (ref 3.5–5.1)
Sodium: 139 mmol/L (ref 135–145)
Total Bilirubin: 1.2 mg/dL (ref 0.3–1.2)
Total Protein: 7.5 g/dL (ref 6.5–8.1)

## 2019-02-01 LAB — BRAIN NATRIURETIC PEPTIDE: B Natriuretic Peptide: 107.9 pg/mL — ABNORMAL HIGH (ref 0.0–100.0)

## 2019-02-01 NOTE — ED Notes (Signed)
Patient transported to x-ray. ?

## 2019-02-01 NOTE — Discharge Instructions (Addendum)
Continue to keep your arm elevated, follow up tomorrow for your ultrasound test.   Follow up with your orthopedic surgeon as discussed

## 2019-02-01 NOTE — ED Provider Notes (Signed)
Cindy [name] Provider Note   CSN: [id number] Arrival date & time: 02/01/19  1437     History   Chief Complaint Chief Complaint  Patient presents with  . Post-op Problem  . Arm Swelling    HPI Cindy [name] is a 73 y.o. female.     HPI  Pt presents with complaints of arm swelling.  Pt had surgery on thursdady.  She started to develop swelling of her arm.  Pt has been having pain in her arm since surgery as well.  She denies any trouble with fever or chills.  Pt also states he gained 17 lbs since the surgery.  She denies any trouble with shortness of breath or chest pain.  Pt called her orthopedic doctor who suggested she see her PCP.  PCP recommended ED evaluation. Past Medical History:  Diagnosis Date  . Abnormal nuclear stress test 12/15/2013  . Anemia    hx iron def, no GI loss indentifed - resolved with diet change    . Anxiety   . Arthritis   . GRAVES' DISEASE 2013   I-131 ablation, now post tx hypothyroid state  . HYPERTENSION   . Impaired glucose tolerance test   . Keloid 12/31/2010   incisional   . Pacemaker -Biotronik-CLS 08/20/2010   Fainted easily  . Postablative hypothyroidism    Qualifier: Diagnosis of  By: Joya Gaskins CMA, [place]    . Sciatica   . Syncope 08/20/2010   s/p PPM 08/2010 for sxc pause on loop recorder causing same  . Unspecified vitamin D deficiency     Patient Active Problem List   Diagnosis Date Noted  . Status post total shoulder arthroplasty, left 01/27/2019  . 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 Anemia, iron deficiency 05/07/2015  . 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 Osteopenia 05/07/2015  . Neurocardiogenic syncope 05/07/2015  . Unable to lose weight 04/24/2014  . Lumbar radiculopathy, chronic 01/03/2014  . Abnormal nuclear stress test 12/15/2013  . Hyperthyroidism 10/06/2011  . Syncope 08/20/2010  . Pacemaker -Biotronik-CLS 08/20/2010  . Essential hypertension 04/15/2010  . PREMATURE VENTRICULAR  CONTRACTIONS 04/15/2010  . GERD 04/15/2010  . Postablative hypothyroidism     Past Surgical History:  Procedure Laterality Date  . APPENDECTOMY    . blephroplasty    . BREAST BIOPSY Left 2001   guided excisional bx (L) breast microcalcification at 12 o'clock- Benign  . BUNIONECTOMY Left   . CATARACT EXTRACTION  2014  . LEFT HEART CATHETERIZATION WITH CORONARY ANGIOGRAM N/A 12/22/2013   Procedure: LEFT HEART CATHETERIZATION WITH CORONARY ANGIOGRAM;  Surgeon: Sinclair Grooms, MD;  Location: [facility] CATH LAB;  Service: Cardiovascular;  Laterality: N/A;  . PACEMAKER INSERTION    . Right ankle  06/2010   Trimalleolar fracture  . TOTAL SHOULDER ARTHROPLASTY Left 01/27/2019   Procedure: TOTAL SHOULDER ARTHROPLASTY;  Surgeon: Tania Ade, MD;  Location: [facility] ORS;  Service: Orthopedics;  Laterality: Left;     OB History   No obstetric history on file.      Home Medications    Prior to Admission medications   Medication Sig Start Date End Date Taking? Authorizing Provider  ALPRAZolam Duanne Moron) 0.5 MG tablet Take 0.25 mg by mouth as needed for anxiety.  05/19/17  Yes [provider]  amLODipine (NORVASC) 5 MG tablet Take 1 tablet (5 mg total) by mouth daily. Patient taking differently: Take 10 mg by mouth daily.  12/17/18  Yes Deboraha Sprang, MD  cholecalciferol (VITAMIN D) 1000 UNITS tablet  Take 1,000 Units by mouth daily.    Yes [provider]  loratadine (CLARITIN) 10 MG tablet Take 10 mg by mouth daily as needed for allergies. Reported on 06/20/2015   Yes [provider]  Multiple Vitamins-Minerals (MULTIVITAMIN WITH MINERALS) tablet Take 1 tablet by mouth daily.   Yes [provider]  oxyCODONE-acetaminophen (PERCOCET) 5-325 MG tablet Take 1-2 tablets every 4 hours as needed for post operative pain. MAX 6/day 01/28/19  Yes Grier Mitts, PA-C  PARoxetine (PAXIL) 20 MG tablet Take 20 mg by mouth daily. 12/17/18  Yes [provider]   SYNTHROID 125 MCG tablet Take 125 mcg by mouth daily before breakfast.  04/21/18  Yes [provider]  tiZANidine (ZANAFLEX) 4 MG tablet Take 1 tablet (4 mg total) by mouth every 8 (eight) hours as needed for muscle spasms. 01/28/19  Yes Grier Mitts, PA-C  traMADol (ULTRAM) 50 MG tablet Take 25 mg by mouth daily as needed for moderate pain.  12/08/18  Yes [provider]  vitamin C (ASCORBIC ACID) 500 MG tablet Take 500 mg by mouth daily.   Yes [provider]    Family History Family History  Problem Relation Age of Onset  . Rheum arthritis Mother   . Emphysema Mother   . Hypertension Mother   . Osteoporosis Mother   . COPD Mother   . Prostate cancer Father   . Hypertension Father   . Heart attack Father   . Osteoporosis Maternal Grandmother   . Stroke Other     Social History Social History   Tobacco Use  . Smoking status: Former Smoker    Types: Cigarettes    Quit date: 04/08/1967    Years since quitting: 51.8  . Smokeless tobacco: Never Used  Substance Use Topics  . Alcohol use: Yes    Alcohol/week: 2.0 standard drinks    Types: 1 Glasses of wine, 1 Cans of beer per week    Comment: OCCASIONAL  . Drug use: No     Allergies   Ace inhibitors, Lisinopril, and Codeine   Review of Systems Review of Systems  Constitutional: Negative for fever.  Respiratory: Negative for shortness of breath.   Cardiovascular: Negative for chest pain.  All other systems reviewed and are negative.    Physical Exam Updated Vital Signs BP (!) 169/65 (BP Location: Right Arm)   Pulse 70   Temp 98.5 F (36.9 C) (Oral)   Resp 18   Ht 1.575 m (5\' 2" )   Wt 72.6 kg   SpO2 94%   BMI 29.26 kg/m   Physical Exam Vitals signs and nursing note reviewed.  Constitutional:      General: She is not in acute distress.    Appearance: She is well-developed.  HENT:     Head: Normocephalic and atraumatic.     Right Ear: External ear normal.     Left Ear:  External ear normal.  Eyes:     General: No scleral icterus.       Right eye: No discharge.        Left eye: No discharge.     Conjunctiva/sclera: Conjunctivae normal.  Neck:     Musculoskeletal: Neck supple.     Trachea: No tracheal deviation.  Cardiovascular:     Rate and Rhythm: Normal rate and regular rhythm.  Pulmonary:     Effort: Pulmonary effort is normal. No respiratory distress.     Breath sounds: Normal breath sounds. No stridor. No wheezing or rales.  Abdominal:     General: Bowel sounds are normal. There is no distension.     Palpations: Abdomen is soft.     Tenderness: There is no abdominal tenderness. There is no guarding or rebound.  Musculoskeletal:        General: No tenderness.     Comments: Trace edema in ankles bilaterally, edema and bruising of the left upper extermity, edema without bruising left lower extrem, no lymphangitic streaking, no drainage around left shoulder  Skin:    General: Skin is warm and dry.     Findings: No rash.  Neurological:     Mental Status: She is alert.     Cranial Nerves: No cranial nerve deficit (no facial droop, extraocular movements intact, no slurred speech).     Sensory: No sensory deficit.     Motor: No abnormal muscle tone or seizure activity.     Coordination: Coordination normal.      ED Treatments / Results  Labs (all labs ordered are listed, but only abnormal results are displayed) Labs Reviewed  COMPREHENSIVE METABOLIC PANEL - Abnormal; Notable for the following components:      Result Value   Potassium 3.3 (*)    All other components within normal limits  BRAIN NATRIURETIC PEPTIDE - Abnormal; Notable for the following components:   B Natriuretic Peptide 107.9 (*)    All other components within normal limits  CBC WITH DIFFERENTIAL/PLATELET    EKG None  Radiology Dg Chest 2 View  Result Date: 02/01/2019 CLINICAL DATA:  Recent left shoulder surgery with chest pain and arm pain, initial encounter EXAM:  CHEST - 2 VIEW COMPARISON:  01/17/2019 FINDINGS: Cardiac shadow is stable. Pacing device is again seen. Moderate-sized hiatal hernia is again noted. The lungs are clear. No acute bony abnormality is noted. Interval left shoulder replacement is noted. No soft tissue changes are seen. IMPRESSION: Interval left shoulder surgery. No acute intrathoracic abnormality is noted. Stable hiatal hernia. Electronically Signed   By: Inez Catalina M.D.   On: 02/01/2019 22:38   Dg Shoulder Left  Result Date: 02/01/2019 CLINICAL DATA:  Postoperative pain EXAM: LEFT SHOULDER - 2+ VIEW COMPARISON:  Radiograph 01/27/2019 FINDINGS: The previously seen lucency through the base of the acromion is no longer evident and likely reflected superimposed soft tissue gas. There is however persistent soft tissue swelling and overlying skin thickening involving the left shoulder. The patient's left shoulder arthroplasty remains in grossly normal alignment. A pacer pack overlies portion of the left glenoid limiting evaluation. The acromioclavicular and coracoclavicular intervals are maintained. Included portions of the left chest wall demonstrates some atelectatic change. IMPRESSION: 1. The previously seen lucency through the base of the acromion is no longer evident and likely reflected superimposed soft tissue gas. 2. Persistent soft tissue swelling and overlying skin thickening involving the left shoulder. Correlate with physical exam findings to exclude a cellulitis. Electronically Signed   By: Lovena Le M.D.   On: 02/01/2019 21:50    Procedures Procedures (including critical care time)  Medications Ordered in ED Medications - No data to display   Initial Impression / Assessment and Plan / ED Course  I have reviewed the triage vital signs and the nursing notes.  Pertinent labs & imaging results that were available during my care of the patient were reviewed by me and considered in my medical decision making (see chart for  details).  Clinical Course as of Oct 28 0000  Tue Feb 01, 2019  2206 Shoulder x-rays reviewed.  Patient's exam is not consistent with infection   [JK]    Clinical Course User Index [JK] Dorie Rank, MD     Patient presented to the ED for evaluation of left arm swelling.  Patient had surgery recently.  On exam she has ecchymoses in her left upper arm and edema down to her hand.  She is afebrile.  She does not have a leukocytosis.  I do not think she is showing any signs of infection.   Patient does not have any evidence to suggest congestive heart failure. I suspect her edema is related to the trauma from the surgery.  However, I will have her follow-up tomorrow to have a Doppler ultrasound study.  Final Clinical Impressions(s) / ED Diagnoses   Final diagnoses:  Left arm swelling  Ecchymosis    ED Discharge Orders         Ordered    UE VENOUS DUPLEX     02/01/19 2357           Dorie Rank, MD 02/02/19 0000

## 2019-02-01 NOTE — ED Triage Notes (Signed)
Pt reports has a shoulder surgery last Thursday. Reports having pains, swelling of left arm and redness for over day.  Called surgeon and spoke to on call person on Sunday night, and is having edema then call PCP. Called PCP yesterday reports had some weight gain since before surgery.

## 2019-02-01 NOTE — ED Notes (Signed)
Patient assisted with repositioning in bed.

## 2019-02-01 NOTE — ED Notes (Addendum)
Patient ambulatory to restroom with no assistance and steady gait.  

## 2019-02-02 ENCOUNTER — Ambulatory Visit (HOSPITAL_COMMUNITY): Payer: PPO

## 2019-02-02 ENCOUNTER — Ambulatory Visit (HOSPITAL_BASED_OUTPATIENT_CLINIC_OR_DEPARTMENT_OTHER)
Admission: RE | Admit: 2019-02-02 | Discharge: 2019-02-02 | Disposition: A | Discharge status: Home / Self Care | Payer: PPO | Source: Ambulatory Visit | Attending: Emergency Medicine | Admitting: Emergency Medicine

## 2019-02-02 DIAGNOSIS — R2232 Localized swelling, mass and lump, left upper limb: Secondary | ICD-10-CM | POA: Diagnosis not present

## 2019-02-02 DIAGNOSIS — M7989 Other specified soft tissue disorders: Secondary | ICD-10-CM

## 2019-02-07 DIAGNOSIS — I10 Essential (primary) hypertension: Secondary | ICD-10-CM | POA: Diagnosis not present

## 2019-02-07 DIAGNOSIS — N1831 Chronic kidney disease, stage 3a: Secondary | ICD-10-CM | POA: Diagnosis not present

## 2019-02-07 DIAGNOSIS — Z96612 Presence of left artificial shoulder joint: Secondary | ICD-10-CM | POA: Diagnosis not present

## 2019-02-07 DIAGNOSIS — Z9889 Other specified postprocedural states: Secondary | ICD-10-CM | POA: Diagnosis not present

## 2019-02-07 DIAGNOSIS — M19012 Primary osteoarthritis, left shoulder: Secondary | ICD-10-CM | POA: Diagnosis not present

## 2019-02-07 DIAGNOSIS — K449 Diaphragmatic hernia without obstruction or gangrene: Secondary | ICD-10-CM | POA: Diagnosis not present

## 2019-02-07 DIAGNOSIS — F321 Major depressive disorder, single episode, moderate: Secondary | ICD-10-CM | POA: Diagnosis not present

## 2019-02-16 DIAGNOSIS — E890 Postprocedural hypothyroidism: Secondary | ICD-10-CM | POA: Diagnosis not present

## 2019-02-16 DIAGNOSIS — R7301 Impaired fasting glucose: Secondary | ICD-10-CM | POA: Diagnosis not present

## 2019-02-23 DIAGNOSIS — E890 Postprocedural hypothyroidism: Secondary | ICD-10-CM | POA: Diagnosis not present

## 2019-02-23 DIAGNOSIS — I10 Essential (primary) hypertension: Secondary | ICD-10-CM | POA: Diagnosis not present

## 2019-02-23 DIAGNOSIS — N189 Chronic kidney disease, unspecified: Secondary | ICD-10-CM | POA: Diagnosis not present

## 2019-02-23 DIAGNOSIS — R7301 Impaired fasting glucose: Secondary | ICD-10-CM | POA: Diagnosis not present

## 2019-02-25 DIAGNOSIS — B370 Candidal stomatitis: Secondary | ICD-10-CM | POA: Diagnosis not present

## 2019-02-25 DIAGNOSIS — J029 Acute pharyngitis, unspecified: Secondary | ICD-10-CM | POA: Diagnosis not present

## 2019-03-07 DIAGNOSIS — M19012 Primary osteoarthritis, left shoulder: Secondary | ICD-10-CM | POA: Diagnosis not present

## 2019-03-14 DIAGNOSIS — Z471 Aftercare following joint replacement surgery: Secondary | ICD-10-CM | POA: Diagnosis not present

## 2019-03-14 DIAGNOSIS — Z96612 Presence of left artificial shoulder joint: Secondary | ICD-10-CM | POA: Diagnosis not present

## 2019-03-17 DIAGNOSIS — Z471 Aftercare following joint replacement surgery: Secondary | ICD-10-CM | POA: Diagnosis not present

## 2019-03-17 DIAGNOSIS — Z96612 Presence of left artificial shoulder joint: Secondary | ICD-10-CM | POA: Diagnosis not present

## 2019-03-22 DIAGNOSIS — Z96612 Presence of left artificial shoulder joint: Secondary | ICD-10-CM | POA: Diagnosis not present

## 2019-03-22 DIAGNOSIS — Z471 Aftercare following joint replacement surgery: Secondary | ICD-10-CM | POA: Diagnosis not present

## 2019-03-24 DIAGNOSIS — Z96612 Presence of left artificial shoulder joint: Secondary | ICD-10-CM | POA: Diagnosis not present

## 2019-03-24 DIAGNOSIS — Z471 Aftercare following joint replacement surgery: Secondary | ICD-10-CM | POA: Diagnosis not present

## 2019-03-29 DIAGNOSIS — Z471 Aftercare following joint replacement surgery: Secondary | ICD-10-CM | POA: Diagnosis not present

## 2019-03-29 DIAGNOSIS — Z96612 Presence of left artificial shoulder joint: Secondary | ICD-10-CM | POA: Diagnosis not present

## 2019-04-06 DIAGNOSIS — E890 Postprocedural hypothyroidism: Secondary | ICD-10-CM | POA: Diagnosis not present

## 2019-04-07 DIAGNOSIS — Z96612 Presence of left artificial shoulder joint: Secondary | ICD-10-CM | POA: Diagnosis not present

## 2019-04-07 DIAGNOSIS — Z471 Aftercare following joint replacement surgery: Secondary | ICD-10-CM | POA: Diagnosis not present

## 2019-04-12 DIAGNOSIS — Z471 Aftercare following joint replacement surgery: Secondary | ICD-10-CM | POA: Diagnosis not present

## 2019-04-12 DIAGNOSIS — Z96612 Presence of left artificial shoulder joint: Secondary | ICD-10-CM | POA: Diagnosis not present

## 2019-04-18 DIAGNOSIS — Z96612 Presence of left artificial shoulder joint: Secondary | ICD-10-CM | POA: Diagnosis not present

## 2019-04-20 DIAGNOSIS — Z96612 Presence of left artificial shoulder joint: Secondary | ICD-10-CM | POA: Diagnosis not present

## 2019-04-20 DIAGNOSIS — Z471 Aftercare following joint replacement surgery: Secondary | ICD-10-CM | POA: Diagnosis not present

## 2019-04-27 DIAGNOSIS — D509 Iron deficiency anemia, unspecified: Secondary | ICD-10-CM | POA: Diagnosis not present

## 2019-04-27 DIAGNOSIS — I10 Essential (primary) hypertension: Secondary | ICD-10-CM | POA: Diagnosis not present

## 2019-04-27 DIAGNOSIS — Z7689 Persons encountering health services in other specified circumstances: Secondary | ICD-10-CM | POA: Diagnosis not present

## 2019-04-27 DIAGNOSIS — E7849 Other hyperlipidemia: Secondary | ICD-10-CM | POA: Diagnosis not present

## 2019-04-27 DIAGNOSIS — Z1331 Encounter for screening for depression: Secondary | ICD-10-CM | POA: Diagnosis not present

## 2019-05-04 ENCOUNTER — Ambulatory Visit: Payer: PPO

## 2019-05-04 DIAGNOSIS — Z96612 Presence of left artificial shoulder joint: Secondary | ICD-10-CM | POA: Diagnosis not present

## 2019-05-04 DIAGNOSIS — Z471 Aftercare following joint replacement surgery: Secondary | ICD-10-CM | POA: Diagnosis not present

## 2019-05-10 DIAGNOSIS — K449 Diaphragmatic hernia without obstruction or gangrene: Secondary | ICD-10-CM | POA: Diagnosis not present

## 2019-05-10 DIAGNOSIS — Z1211 Encounter for screening for malignant neoplasm of colon: Secondary | ICD-10-CM | POA: Diagnosis not present

## 2019-05-11 DIAGNOSIS — Z471 Aftercare following joint replacement surgery: Secondary | ICD-10-CM | POA: Diagnosis not present

## 2019-05-11 DIAGNOSIS — Z96612 Presence of left artificial shoulder joint: Secondary | ICD-10-CM | POA: Diagnosis not present

## 2019-05-13 ENCOUNTER — Ambulatory Visit: Payer: PPO | Attending: Internal Medicine

## 2019-05-13 DIAGNOSIS — Z23 Encounter for immunization: Secondary | ICD-10-CM | POA: Insufficient documentation

## 2019-05-13 NOTE — Progress Notes (Signed)
   Covid-19 Vaccination Clinic  Name:  Cindy [name]    MRN: [id number] DOB: [birth year]  05/13/2019  Cindy [name] was observed post Covid-19 immunization for 15 minutes without incidence. She was provided with Vaccine Information Sheet and instruction to access the V-Safe system.   Cindy [name] was instructed to call 911 with any severe reactions post vaccine: Marland Kitchen Difficulty breathing  . Swelling of your face and throat  . A fast heartbeat  . A bad rash all over your body  . Dizziness and weakness    Immunizations Administered    Name Date Dose VIS Date Route   Pfizer COVID-19 Vaccine 05/13/2019 12:10 PM 0.3 mL 03/18/2019 Intramuscular   Manufacturer: [place]   Lot: [id number]   [place]: [id number]

## 2019-05-15 ENCOUNTER — Ambulatory Visit: Payer: PPO

## 2019-05-31 ENCOUNTER — Ambulatory Visit (INDEPENDENT_AMBULATORY_CARE_PROVIDER_SITE_OTHER): Payer: PPO | Admitting: *Deleted

## 2019-05-31 ENCOUNTER — Other Ambulatory Visit: Payer: Self-pay

## 2019-05-31 DIAGNOSIS — R55 Syncope and collapse: Secondary | ICD-10-CM

## 2019-05-31 DIAGNOSIS — Z950 Presence of cardiac pacemaker: Secondary | ICD-10-CM

## 2019-05-31 LAB — CUP PACEART INCLINIC DEVICE CHECK
Battery Remaining Longevity: 65 mo
Brady Statistic RA Percent Paced: 55 %
Brady Statistic RV Percent Paced: 4 %
Date Time Interrogation Session: 20210223164338
Implantable Lead Implant Date: 20120606
Implantable Lead Implant Date: 20120606
Implantable Lead Location: 753859
Implantable Lead Location: 753860
Implantable Lead Model: 5076
Implantable Lead Model: 5076
Implantable Pulse Generator Implant Date: 20120606
Lead Channel Pacing Threshold Amplitude: 0.7 V
Lead Channel Pacing Threshold Amplitude: 1.2 V
Lead Channel Pacing Threshold Pulse Width: 0.4 ms
Lead Channel Pacing Threshold Pulse Width: 0.4 ms
Lead Channel Sensing Intrinsic Amplitude: 14.7 mV
Lead Channel Sensing Intrinsic Amplitude: 6.6 mV
Lead Channel Setting Pacing Amplitude: 2 V
Lead Channel Setting Pacing Amplitude: 2.4 V
Lead Channel Setting Pacing Pulse Width: 0.4 ms
Pulse Gen Serial Number: 66089338

## 2019-05-31 NOTE — Progress Notes (Signed)
Pacemaker check in clinic. Normal device function. Thresholds, sensing, impedances consistent with previous measurements. Device programmed to maximize longevity. 16 mode switches with EGMs  that show AT.No high ventricular rates noted. Device programmed at appropriate safety margins. Histogram distribution appropriate for patient activity level. Device programmed to optimize intrinsic conduction. Estimated longevity 5 yrs 5 months. Patient is not enrolled in remote follow-up . Next in clinic check 06/16/19 at annual appointment with Dr Caryl Comes . Patient education completed.

## 2019-06-01 DIAGNOSIS — K449 Diaphragmatic hernia without obstruction or gangrene: Secondary | ICD-10-CM | POA: Diagnosis not present

## 2019-06-01 DIAGNOSIS — Z1211 Encounter for screening for malignant neoplasm of colon: Secondary | ICD-10-CM | POA: Diagnosis not present

## 2019-06-01 DIAGNOSIS — D509 Iron deficiency anemia, unspecified: Secondary | ICD-10-CM | POA: Diagnosis not present

## 2019-06-01 DIAGNOSIS — K5730 Diverticulosis of large intestine without perforation or abscess without bleeding: Secondary | ICD-10-CM | POA: Diagnosis not present

## 2019-06-07 DIAGNOSIS — E890 Postprocedural hypothyroidism: Secondary | ICD-10-CM | POA: Diagnosis not present

## 2019-06-08 ENCOUNTER — Ambulatory Visit: Payer: PPO | Attending: Internal Medicine

## 2019-06-08 DIAGNOSIS — Z23 Encounter for immunization: Secondary | ICD-10-CM | POA: Insufficient documentation

## 2019-06-08 NOTE — Progress Notes (Signed)
   Covid-19 Vaccination Clinic  Name:  Cindy [name]    MRN: [id number] DOB: [birth year]  06/08/2019  Cindy [name] was observed post Covid-19 immunization for 15 minutes without incident. She was provided with Vaccine Information Sheet and instruction to access the V-Safe system.   Cindy [name] was instructed to call 911 with any severe reactions post vaccine: Marland Kitchen Difficulty breathing  . Swelling of face and throat  . A fast heartbeat  . A bad rash all over body  . Dizziness and weakness   Immunizations Administered    Name Date Dose VIS Date Route   Pfizer COVID-19 Vaccine 06/08/2019  9:09 AM 0.3 mL 03/18/2019 Intramuscular   Manufacturer: [place]   Lot: [id number]   [place]: [id number]

## 2019-06-15 DIAGNOSIS — I471 Supraventricular tachycardia: Secondary | ICD-10-CM | POA: Insufficient documentation

## 2019-06-16 ENCOUNTER — Encounter: Payer: PPO | Admitting: Internal Medicine

## 2019-06-17 ENCOUNTER — Other Ambulatory Visit: Payer: Self-pay

## 2019-06-17 ENCOUNTER — Ambulatory Visit (INDEPENDENT_AMBULATORY_CARE_PROVIDER_SITE_OTHER): Payer: PPO | Admitting: Internal Medicine

## 2019-06-17 ENCOUNTER — Encounter: Payer: Self-pay | Admitting: Internal Medicine

## 2019-06-17 VITALS — BP 138/78 | HR 74 | Ht 62.0 in | Wt 160.2 lb

## 2019-06-17 DIAGNOSIS — I471 Supraventricular tachycardia: Secondary | ICD-10-CM

## 2019-06-17 DIAGNOSIS — R55 Syncope and collapse: Secondary | ICD-10-CM

## 2019-06-17 DIAGNOSIS — Z950 Presence of cardiac pacemaker: Secondary | ICD-10-CM

## 2019-06-17 NOTE — Progress Notes (Signed)
Patient Care Team: Michael Boston, MD as PCP - General (Internal Medicine) Deboraha Sprang, MD as PCP - Electrophysiology (Cardiology) Deboraha Sprang, MD (Cardiology) Jacelyn Pi, MD (Endocrinology) Juanita Craver, MD (Gastroenterology)   HPI  Cindy [name] is a 74 y.o. female Seen in follow-up for vasovagal syncope for which she underwent Biotronik CLS pacing 2012. Marland Kitchen   No interval syncope  Some DOE, no edema no chest pain   DATE TEST EF   8/15 Myoview   73 % Anterior reversible defect   9/15 LHC   65 % Cors normal             Date Cr K Hgb  2/19 0.87 3.6 9.1.  (MCV 90)  6/19  3.1    12/19 0.84 3.8 13.8  10/20 0.85 3.3 12.1  1/21  3.7     Past Medical History:  Diagnosis Date  . Abnormal nuclear stress test 12/15/2013  . Anemia    hx iron def, no GI loss indentifed - resolved with diet change    . Anxiety   . Arthritis   . GRAVES' DISEASE 2013   I-131 ablation, now post tx hypothyroid state  . HYPERTENSION   . Impaired glucose tolerance test   . Keloid 12/31/2010   incisional   . Pacemaker -Biotronik-CLS 08/20/2010   Fainted easily  . Postablative hypothyroidism    Qualifier: Diagnosis of  By: Joya Gaskins CMA, [place]    . Sciatica   . Syncope 08/20/2010   s/p PPM 08/2010 for sxc pause on loop recorder causing same  . Unspecified vitamin D deficiency     Past Surgical History:  Procedure Laterality Date  . APPENDECTOMY    . blephroplasty    . BREAST BIOPSY Left 2001   guided excisional bx (L) breast microcalcification at 12 o'clock- Benign  . BUNIONECTOMY Left   . CATARACT EXTRACTION  2014  . LEFT HEART CATHETERIZATION WITH CORONARY ANGIOGRAM N/A 12/22/2013   Procedure: LEFT HEART CATHETERIZATION WITH CORONARY ANGIOGRAM;  Surgeon: Sinclair Grooms, MD;  Location[facility] CATH LAB;  Service: Cardiovascular;  Laterality: N/A;  . PACEMAKER INSERTION    . Right ankle  06/2010   Trimalleolar fracture  . TOTAL SHOULDER ARTHROPLASTY Left 01/27/2019   Procedure: TOTAL SHOULDER ARTHROPLASTY;  Surgeon: Tania Ade, MD;  Location: [facility] ORS;  Service: Orthopedics;  Laterality: Left;    Current Outpatient Medications  Medication Sig Dispense Refill  . ALPRAZolam (XANAX) 0.5 MG tablet Take 0.25 mg by mouth as needed for anxiety.   0  . amLODipine (NORVASC) 5 MG tablet Take 1 tablet (5 mg total) by mouth daily. 90 tablet 3  . cholecalciferol (VITAMIN D) 1000 UNITS tablet Take 1,000 Units by mouth daily.     . hydrochlorothiazide (HYDRODIURIL) 12.5 MG tablet Take 12.5 mg by mouth every morning.    . loratadine (CLARITIN) 10 MG tablet Take 10 mg by mouth daily as needed for allergies. Reported on 06/20/2015    . Multiple Vitamins-Minerals (MULTIVITAMIN WITH MINERALS) tablet Take 1 tablet by mouth daily.    . sertraline (ZOLOFT) 50 MG tablet Take 50 mg by mouth daily.    Marland Kitchen SYNTHROID 137 MCG tablet Take 137 mcg by mouth every morning.     No current facility-administered medications for this visit.    Allergies  Allergen Reactions  . Ace Inhibitors Swelling  . Lisinopril Swelling    angiodema  . Codeine Nausea And Vomiting    Review  of Systems negative except from HPI and PMH  Physical Exam BP 138/78   Pulse 74   Ht 5\' 2"  (1.575 m)   Wt 160 lb 3.2 oz (72.7 kg)   SpO2 96%   BMI 29.30 kg/m    Well developed and well nourished in no acute distress HENT normal Neck supple with JVP-flat Clear Device pocket well healed; without hematoma or erythema.  There is no tethering  Regular rate and rhythm, no murmur Abd-soft with active BS  No Clubbing cyanosis    edema Skin-warm and dry A & Oriented  Grossly normal sensory and motor function  ECG atrial pacing  @ 74 20/08/40   Assessment and  Plan     Vasovagal syncope  Hypertension  Pacemaker - biotronik  The patient's device was interrogated.  The information was reviewed. No changes were made in the programming.        Atrial tachycardia   ACE inhibitor-angioedema   BP  remains elevated  Her potassium was low.   now better.  S sent a message to her PCP to consider changing her hydrochlorothiazide to spironolactone to help with blood pressure control and to avoid hypokalemia  No interval syncope.  No interval atrial tachycardia

## 2019-06-17 NOTE — Patient Instructions (Signed)

## 2019-06-28 DIAGNOSIS — F329 Major depressive disorder, single episode, unspecified: Secondary | ICD-10-CM | POA: Diagnosis not present

## 2019-06-28 DIAGNOSIS — F419 Anxiety disorder, unspecified: Secondary | ICD-10-CM | POA: Diagnosis not present

## 2019-06-28 DIAGNOSIS — I129 Hypertensive chronic kidney disease with stage 1 through stage 4 chronic kidney disease, or unspecified chronic kidney disease: Secondary | ICD-10-CM | POA: Diagnosis not present

## 2019-07-20 DIAGNOSIS — Z96612 Presence of left artificial shoulder joint: Secondary | ICD-10-CM | POA: Diagnosis not present

## 2019-08-09 DIAGNOSIS — R194 Change in bowel habit: Secondary | ICD-10-CM | POA: Diagnosis not present

## 2019-08-09 DIAGNOSIS — K5730 Diverticulosis of large intestine without perforation or abscess without bleeding: Secondary | ICD-10-CM | POA: Diagnosis not present

## 2019-08-09 DIAGNOSIS — K449 Diaphragmatic hernia without obstruction or gangrene: Secondary | ICD-10-CM | POA: Diagnosis not present

## 2019-08-09 DIAGNOSIS — E890 Postprocedural hypothyroidism: Secondary | ICD-10-CM | POA: Diagnosis not present

## 2019-09-13 DIAGNOSIS — K5730 Diverticulosis of large intestine without perforation or abscess without bleeding: Secondary | ICD-10-CM | POA: Diagnosis not present

## 2019-09-13 DIAGNOSIS — R194 Change in bowel habit: Secondary | ICD-10-CM | POA: Diagnosis not present

## 2019-09-14 DIAGNOSIS — R194 Change in bowel habit: Secondary | ICD-10-CM | POA: Diagnosis not present

## 2019-09-14 DIAGNOSIS — K5730 Diverticulosis of large intestine without perforation or abscess without bleeding: Secondary | ICD-10-CM | POA: Diagnosis not present

## 2019-09-21 DIAGNOSIS — R7301 Impaired fasting glucose: Secondary | ICD-10-CM | POA: Diagnosis not present

## 2019-09-21 DIAGNOSIS — M859 Disorder of bone density and structure, unspecified: Secondary | ICD-10-CM | POA: Diagnosis not present

## 2019-09-21 DIAGNOSIS — E7849 Other hyperlipidemia: Secondary | ICD-10-CM | POA: Diagnosis not present

## 2019-09-27 DIAGNOSIS — N1831 Chronic kidney disease, stage 3a: Secondary | ICD-10-CM | POA: Diagnosis not present

## 2019-09-27 DIAGNOSIS — Z0000 Encounter for general adult medical examination without abnormal findings: Secondary | ICD-10-CM | POA: Diagnosis not present

## 2019-09-27 DIAGNOSIS — F419 Anxiety disorder, unspecified: Secondary | ICD-10-CM | POA: Diagnosis not present

## 2019-09-27 DIAGNOSIS — E663 Overweight: Secondary | ICD-10-CM | POA: Diagnosis not present

## 2019-09-27 DIAGNOSIS — Z1331 Encounter for screening for depression: Secondary | ICD-10-CM | POA: Diagnosis not present

## 2019-09-27 DIAGNOSIS — I129 Hypertensive chronic kidney disease with stage 1 through stage 4 chronic kidney disease, or unspecified chronic kidney disease: Secondary | ICD-10-CM | POA: Diagnosis not present

## 2019-09-27 DIAGNOSIS — M859 Disorder of bone density and structure, unspecified: Secondary | ICD-10-CM | POA: Diagnosis not present

## 2019-09-27 DIAGNOSIS — E7849 Other hyperlipidemia: Secondary | ICD-10-CM | POA: Diagnosis not present

## 2019-09-27 DIAGNOSIS — E890 Postprocedural hypothyroidism: Secondary | ICD-10-CM | POA: Diagnosis not present

## 2019-10-04 DIAGNOSIS — M85852 Other specified disorders of bone density and structure, left thigh: Secondary | ICD-10-CM | POA: Diagnosis not present

## 2019-10-04 DIAGNOSIS — Z1231 Encounter for screening mammogram for malignant neoplasm of breast: Secondary | ICD-10-CM | POA: Diagnosis not present

## 2019-10-04 DIAGNOSIS — M85851 Other specified disorders of bone density and structure, right thigh: Secondary | ICD-10-CM | POA: Diagnosis not present

## 2019-10-04 DIAGNOSIS — Z780 Asymptomatic menopausal state: Secondary | ICD-10-CM | POA: Diagnosis not present

## 2019-11-02 ENCOUNTER — Encounter (HOSPITAL_COMMUNITY): Payer: Self-pay | Admitting: Pediatrics

## 2019-11-02 ENCOUNTER — Other Ambulatory Visit: Payer: Self-pay

## 2019-11-02 ENCOUNTER — Emergency Department (HOSPITAL_COMMUNITY)
Admission: EM | Admit: 2019-11-02 | Discharge: 2019-11-03 | Disposition: A | Discharge status: Home / Self Care | Payer: PPO | Attending: Emergency Medicine | Admitting: Emergency Medicine

## 2019-11-02 ENCOUNTER — Emergency Department (HOSPITAL_COMMUNITY): Payer: PPO

## 2019-11-02 DIAGNOSIS — Z5321 Procedure and treatment not carried out due to patient leaving prior to being seen by health care provider: Secondary | ICD-10-CM | POA: Insufficient documentation

## 2019-11-02 DIAGNOSIS — R079 Chest pain, unspecified: Secondary | ICD-10-CM | POA: Diagnosis not present

## 2019-11-02 DIAGNOSIS — R1013 Epigastric pain: Secondary | ICD-10-CM | POA: Insufficient documentation

## 2019-11-02 DIAGNOSIS — R0602 Shortness of breath: Secondary | ICD-10-CM | POA: Diagnosis not present

## 2019-11-02 DIAGNOSIS — R61 Generalized hyperhidrosis: Secondary | ICD-10-CM | POA: Insufficient documentation

## 2019-11-02 DIAGNOSIS — K449 Diaphragmatic hernia without obstruction or gangrene: Secondary | ICD-10-CM | POA: Insufficient documentation

## 2019-11-02 DIAGNOSIS — R55 Syncope and collapse: Secondary | ICD-10-CM | POA: Diagnosis not present

## 2019-11-02 DIAGNOSIS — I10 Essential (primary) hypertension: Secondary | ICD-10-CM | POA: Diagnosis not present

## 2019-11-02 DIAGNOSIS — J90 Pleural effusion, not elsewhere classified: Secondary | ICD-10-CM | POA: Diagnosis not present

## 2019-11-02 DIAGNOSIS — R0902 Hypoxemia: Secondary | ICD-10-CM | POA: Diagnosis not present

## 2019-11-02 DIAGNOSIS — R0789 Other chest pain: Secondary | ICD-10-CM | POA: Diagnosis not present

## 2019-11-02 LAB — CBC
HCT: 43.6 % (ref 36.0–46.0)
Hemoglobin: 14.2 g/dL (ref 12.0–15.0)
MCH: 30.3 pg (ref 26.0–34.0)
MCHC: 32.6 g/dL (ref 30.0–36.0)
MCV: 93.2 fL (ref 80.0–100.0)
Platelets: 263 K/uL (ref 150–400)
RBC: 4.68 MIL/uL (ref 3.87–5.11)
RDW: 13 % (ref 11.5–15.5)
WBC: 7.4 K/uL (ref 4.0–10.5)
nRBC: 0 % (ref 0.0–0.2)

## 2019-11-02 LAB — BASIC METABOLIC PANEL WITH GFR
Anion gap: 10 (ref 5–15)
BUN: 28 mg/dL — ABNORMAL HIGH (ref 8–23)
CO2: 23 mmol/L (ref 22–32)
Calcium: 8.9 mg/dL (ref 8.9–10.3)
Chloride: 105 mmol/L (ref 98–111)
Creatinine, Ser: 0.97 mg/dL (ref 0.44–1.00)
GFR calc Af Amer: >60 mL/min
GFR calc non Af Amer: 58 mL/min — ABNORMAL LOW
Glucose, Bld: 149 mg/dL — ABNORMAL HIGH (ref 70–99)
Potassium: 3.9 mmol/L (ref 3.5–5.1)
Sodium: 138 mmol/L (ref 135–145)

## 2019-11-02 LAB — TROPONIN I (HIGH SENSITIVITY): Troponin I (High Sensitivity): 4 ng/L (ref <18)

## 2019-11-02 MED ORDER — SODIUM CHLORIDE 0.9% FLUSH: 3.0000 mL | Freq: Once | INTRAVENOUS | Status: DC

## 2019-11-02 NOTE — ED Triage Notes (Signed)
Arrived via EMS; from UC; c/o epigastric pain radiating to the back, sudden onset that caused diaphoresis and shortness of breath. Hx of pacemaker, syncope and hiatal hernia.

## 2019-11-02 NOTE — ED Notes (Signed)
Patient states she saw her mychart and all her labs and xray was normal so she was going to leave. Advised patient to still be seen but she said she will follow up with her primary care. Triage rn notified. Patient IV removed.

## 2019-11-14 DIAGNOSIS — K449 Diaphragmatic hernia without obstruction or gangrene: Secondary | ICD-10-CM | POA: Diagnosis not present

## 2019-11-14 DIAGNOSIS — K219 Gastro-esophageal reflux disease without esophagitis: Secondary | ICD-10-CM | POA: Diagnosis not present

## 2019-11-14 DIAGNOSIS — N1831 Chronic kidney disease, stage 3a: Secondary | ICD-10-CM | POA: Diagnosis not present

## 2019-11-14 DIAGNOSIS — I129 Hypertensive chronic kidney disease with stage 1 through stage 4 chronic kidney disease, or unspecified chronic kidney disease: Secondary | ICD-10-CM | POA: Diagnosis not present

## 2019-11-21 DIAGNOSIS — Z961 Presence of intraocular lens: Secondary | ICD-10-CM | POA: Diagnosis not present

## 2019-11-21 DIAGNOSIS — H43393 Other vitreous opacities, bilateral: Secondary | ICD-10-CM | POA: Diagnosis not present

## 2019-11-21 DIAGNOSIS — Z9889 Other specified postprocedural states: Secondary | ICD-10-CM | POA: Diagnosis not present

## 2019-11-21 DIAGNOSIS — D492 Neoplasm of unspecified behavior of bone, soft tissue, and skin: Secondary | ICD-10-CM | POA: Diagnosis not present

## 2019-12-26 DIAGNOSIS — K449 Diaphragmatic hernia without obstruction or gangrene: Secondary | ICD-10-CM | POA: Diagnosis not present

## 2019-12-26 DIAGNOSIS — K219 Gastro-esophageal reflux disease without esophagitis: Secondary | ICD-10-CM | POA: Diagnosis not present

## 2019-12-26 DIAGNOSIS — I129 Hypertensive chronic kidney disease with stage 1 through stage 4 chronic kidney disease, or unspecified chronic kidney disease: Secondary | ICD-10-CM | POA: Diagnosis not present

## 2019-12-26 DIAGNOSIS — N1831 Chronic kidney disease, stage 3a: Secondary | ICD-10-CM | POA: Diagnosis not present

## 2019-12-29 DIAGNOSIS — K5730 Diverticulosis of large intestine without perforation or abscess without bleeding: Secondary | ICD-10-CM | POA: Diagnosis not present

## 2019-12-29 DIAGNOSIS — K219 Gastro-esophageal reflux disease without esophagitis: Secondary | ICD-10-CM | POA: Diagnosis not present

## 2019-12-29 DIAGNOSIS — K449 Diaphragmatic hernia without obstruction or gangrene: Secondary | ICD-10-CM | POA: Diagnosis not present

## 2019-12-29 DIAGNOSIS — R140 Abdominal distension (gaseous): Secondary | ICD-10-CM | POA: Diagnosis not present

## 2019-12-30 ENCOUNTER — Other Ambulatory Visit: Payer: Self-pay | Admitting: Gastroenterology

## 2019-12-30 DIAGNOSIS — K449 Diaphragmatic hernia without obstruction or gangrene: Secondary | ICD-10-CM

## 2019-12-30 DIAGNOSIS — R0789 Other chest pain: Secondary | ICD-10-CM

## 2020-01-03 ENCOUNTER — Ambulatory Visit
Admission: RE | Admit: 2020-01-03 | Discharge: 2020-01-03 | Disposition: A | Discharge status: Home / Self Care | Payer: PPO | Source: Ambulatory Visit | Attending: Gastroenterology | Admitting: Gastroenterology

## 2020-01-03 DIAGNOSIS — K449 Diaphragmatic hernia without obstruction or gangrene: Secondary | ICD-10-CM | POA: Diagnosis not present

## 2020-01-03 DIAGNOSIS — R0789 Other chest pain: Secondary | ICD-10-CM

## 2020-01-25 DIAGNOSIS — N1831 Chronic kidney disease, stage 3a: Secondary | ICD-10-CM | POA: Diagnosis not present

## 2020-01-25 DIAGNOSIS — K449 Diaphragmatic hernia without obstruction or gangrene: Secondary | ICD-10-CM | POA: Diagnosis not present

## 2020-01-25 DIAGNOSIS — K219 Gastro-esophageal reflux disease without esophagitis: Secondary | ICD-10-CM | POA: Diagnosis not present

## 2020-01-25 DIAGNOSIS — I129 Hypertensive chronic kidney disease with stage 1 through stage 4 chronic kidney disease, or unspecified chronic kidney disease: Secondary | ICD-10-CM | POA: Diagnosis not present

## 2020-01-31 ENCOUNTER — Other Ambulatory Visit: Payer: Self-pay

## 2020-01-31 MED ORDER — AMLODIPINE BESYLATE 5 MG PO TABS: 5.0000 mg | ORAL_TABLET | Freq: Every day | ORAL | 1 refills | Status: DC

## 2020-02-15 DIAGNOSIS — R7301 Impaired fasting glucose: Secondary | ICD-10-CM | POA: Diagnosis not present

## 2020-02-15 DIAGNOSIS — E890 Postprocedural hypothyroidism: Secondary | ICD-10-CM | POA: Diagnosis not present

## 2020-02-21 DIAGNOSIS — K219 Gastro-esophageal reflux disease without esophagitis: Secondary | ICD-10-CM | POA: Diagnosis not present

## 2020-02-21 DIAGNOSIS — I129 Hypertensive chronic kidney disease with stage 1 through stage 4 chronic kidney disease, or unspecified chronic kidney disease: Secondary | ICD-10-CM | POA: Diagnosis not present

## 2020-02-21 DIAGNOSIS — N1831 Chronic kidney disease, stage 3a: Secondary | ICD-10-CM | POA: Diagnosis not present

## 2020-02-22 DIAGNOSIS — N189 Chronic kidney disease, unspecified: Secondary | ICD-10-CM | POA: Diagnosis not present

## 2020-02-22 DIAGNOSIS — I10 Essential (primary) hypertension: Secondary | ICD-10-CM | POA: Diagnosis not present

## 2020-02-22 DIAGNOSIS — E890 Postprocedural hypothyroidism: Secondary | ICD-10-CM | POA: Diagnosis not present

## 2020-02-22 DIAGNOSIS — R7301 Impaired fasting glucose: Secondary | ICD-10-CM | POA: Diagnosis not present

## 2020-02-24 DIAGNOSIS — K449 Diaphragmatic hernia without obstruction or gangrene: Secondary | ICD-10-CM | POA: Diagnosis not present

## 2020-03-23 ENCOUNTER — Telehealth: Payer: Self-pay

## 2020-03-23 NOTE — Telephone Encounter (Signed)
Patient called back returning Carly's call

## 2020-03-23 NOTE — Telephone Encounter (Signed)
Left message for patient to call back  

## 2020-03-23 NOTE — Telephone Encounter (Signed)
Spoke with the patient in regards to her MyChart message. She states that she has had a few episodes of chest pressure and dizziness recently. She has a history of vasovagal syncope. She states that she has not had any syncopal episodes. She has a hiatal hernia that causes her pain often. She is having it repaired 1/04 and is wondering if she can come in for a pacemaker check prior to her surgery.

## 2020-03-23 NOTE — Telephone Encounter (Signed)
Left message for patient to call back in regards to MyChart message.

## 2020-03-27 ENCOUNTER — Encounter: Payer: Self-pay | Admitting: Internal Medicine

## 2020-03-27 DIAGNOSIS — M1990 Unspecified osteoarthritis, unspecified site: Secondary | ICD-10-CM | POA: Diagnosis not present

## 2020-03-27 DIAGNOSIS — F339 Major depressive disorder, recurrent, unspecified: Secondary | ICD-10-CM | POA: Diagnosis not present

## 2020-03-27 DIAGNOSIS — Z7689 Persons encountering health services in other specified circumstances: Secondary | ICD-10-CM | POA: Diagnosis not present

## 2020-03-27 DIAGNOSIS — N1831 Chronic kidney disease, stage 3a: Secondary | ICD-10-CM | POA: Diagnosis not present

## 2020-03-27 DIAGNOSIS — R7303 Prediabetes: Secondary | ICD-10-CM | POA: Diagnosis not present

## 2020-03-27 DIAGNOSIS — F419 Anxiety disorder, unspecified: Secondary | ICD-10-CM | POA: Diagnosis not present

## 2020-03-27 DIAGNOSIS — I129 Hypertensive chronic kidney disease with stage 1 through stage 4 chronic kidney disease, or unspecified chronic kidney disease: Secondary | ICD-10-CM | POA: Diagnosis not present

## 2020-03-27 DIAGNOSIS — Z1159 Encounter for screening for other viral diseases: Secondary | ICD-10-CM | POA: Diagnosis not present

## 2020-03-27 NOTE — Patient Instructions (Addendum)
DUE TO COVID-19 ONLY ONE VISITOR IS ALLOWED TO COME WITH YOU AND STAY IN THE WAITING ROOM ONLY DURING PRE OP AND PROCEDURE DAY OF SURGERY. THE 1 VISITOR  MAY VISIT WITH YOU AFTER SURGERY IN YOUR PRIVATE ROOM DURING VISITING HOURS ONLY!  YOU NEED TO HAVE A COVID 19 TEST ON: 04/09/20 @ 8:45 AM, THIS TEST MUST BE DONE BEFORE SURGERY,  COVID TESTING SITE [place] [id number], IT IS ON THE RIGHT GOING OUT [address] APPROXIMATELY  2 MINUTES PAST [employer] ON THE RIGHT. ONCE YOUR COVID TEST IS COMPLETED,  PLEASE BEGIN THE QUARANTINE INSTRUCTIONS AS OUTLINED IN YOUR HANDOUT.                Cindy [name]   Your procedure is scheduled on: 04/10/20   Report to [facility] Main  Entrance   Report to admitting at: 9:00 AM     Call this number if you have problems the morning of surgery [phone]    Remember: Do not eat food or drink liquids :After Midnight.   BRUSH YOUR TEETH MORNING OF SURGERY AND RINSE YOUR MOUTH OUT, NO CHEWING GUM CANDY OR MINTS.    Take these medicines the morning of surgery with A SIP OF WATER: amlodipine,synthroid,bystolic,pantoprazole.Xanax as needed.                               You may not have any metal on your body including hair pins and              piercings  Do not wear jewelry, make-up, lotions, powders or perfumes, deodorant             Do not wear nail polish on your fingernails.  Do not shave  48 hours prior to surgery.         Do not bring valuables to the hospital. [place].  Contacts, dentures or bridgework may not be worn into surgery.  Leave suitcase in the car. After surgery it may be brought to your room.     Patients discharged the day of surgery will not be allowed to drive home. IF YOU ARE HAVING SURGERY AND GOING HOME THE SAME DAY, YOU MUST HAVE AN ADULT TO DRIVE YOU HOME AND BE WITH YOU FOR 24 HOURS. YOU MAY GO HOME BY TAXI OR UBER OR ORTHERWISE,  BUT AN ADULT MUST ACCOMPANY YOU HOME AND STAY WITH YOU FOR 24 HOURS.  Name and phone number of your driver:  Special Instructions: N/A              Please read over the following fact sheets you were given: _____________________________________________________________________          [facility] - Preparing for Surgery Before surgery, you can play an important role.  Because skin is not sterile, your skin needs to be as free of germs as possible.  You can reduce the number of germs on your skin by washing with CHG (chlorahexidine gluconate) soap before surgery.  CHG is an antiseptic cleaner which kills germs and bonds with the skin to continue killing germs even after washing. Please DO NOT use if you have an allergy to CHG or antibacterial soaps.  If your skin becomes reddened/irritated stop using the CHG and inform your nurse when you  arrive at Short Stay. Do not shave (including legs and underarms) for at least 48 hours prior to the first CHG shower.  You may shave your face/neck. Please follow these instructions carefully:  1.  Shower with CHG Soap the night before surgery and the  morning of Surgery.  2.  If you choose to wash your hair, wash your hair first as usual with your  normal  shampoo.  3.  After you shampoo, rinse your hair and body thoroughly to remove the  shampoo.                           4.  Use CHG as you would any other liquid soap.  You can apply chg directly  to the skin and wash                       Gently with a scrungie or clean washcloth.  5.  Apply the CHG Soap to your body ONLY FROM THE NECK DOWN.   Do not use on face/ open                           Wound or open sores. Avoid contact with eyes, ears mouth and genitals (private parts).                       Wash face,  Genitals (private parts) with your normal soap.             6.  Wash thoroughly, paying special attention to the area where your surgery  will be performed.  7.  Thoroughly rinse your body with warm  water from the neck down.  8.  DO NOT shower/wash with your normal soap after using and rinsing off  the CHG Soap.                9.  Pat yourself dry with a clean towel.            10.  Wear clean pajamas.            11.  Place clean sheets on your bed the night of your first shower and do not  sleep with pets. Day of Surgery : Do not apply any lotions/deodorants the morning of surgery.  Please wear clean clothes to the hospital/surgery center.  FAILURE TO FOLLOW THESE INSTRUCTIONS MAY RESULT IN THE CANCELLATION OF YOUR SURGERY PATIENT SIGNATURE_________________________________  NURSE SIGNATURE__________________________________  ________________________________________________________________________

## 2020-03-27 NOTE — Progress Notes (Unsigned)
PERIOPERATIVE PRESCRIPTION FOR IMPLANTED CARDIAC DEVICE PROGRAMMING  Patient Information: Name:  Cindy [name]  DOB:  [birth year]  MRN:  [id number]  {TIP - You do not have to delete this tip  -  Copy the info from the staff message sent by the PAT staff  then press F2 here and paste the information using CTL - V on the next line :[id number]}  Planned Procedure: Laparoscopic repair of hernia  Surgeon: Dr. Johnathan Hausen  Date of Procedure: 04/10/20  Cautery will be used.  Position during surgery:    Please send documentation back to:  [facility] (Fax # [phone])    Device Information:  Clinic EP Physician:  Virl Axe, MD   Device Type:  Pacemaker Manufacturer and Phone #:  Biotronik: [phone] Pacemaker Dependent?:  No. Date of Last Device Check:  05/31/19 Normal Device Function?:  Yes.    Electrophysiologist's Recommendations:   Have magnet available.  Provide continuous ECG monitoring when magnet is used or reprogramming is to be performed.   Procedure may interfere with device function.  Magnet should be placed over device during procedure.  Per Device Clinic Standing Orders, LAUREL HARNDEN, RN  2:23 PM 03/27/2020

## 2020-03-28 ENCOUNTER — Encounter (HOSPITAL_COMMUNITY)
Admission: RE | Admit: 2020-03-28 | Discharge: 2020-03-28 | Disposition: A | Discharge status: Home / Self Care | Payer: PPO | Source: Ambulatory Visit | Attending: Surgery | Admitting: Surgery

## 2020-03-28 ENCOUNTER — Other Ambulatory Visit: Payer: Self-pay

## 2020-03-28 ENCOUNTER — Encounter (HOSPITAL_COMMUNITY): Payer: Self-pay

## 2020-03-28 DIAGNOSIS — I10 Essential (primary) hypertension: Secondary | ICD-10-CM | POA: Insufficient documentation

## 2020-03-28 DIAGNOSIS — Z87891 Personal history of nicotine dependence: Secondary | ICD-10-CM | POA: Diagnosis not present

## 2020-03-28 DIAGNOSIS — Z96612 Presence of left artificial shoulder joint: Secondary | ICD-10-CM | POA: Insufficient documentation

## 2020-03-28 DIAGNOSIS — Z01812 Encounter for preprocedural laboratory examination: Secondary | ICD-10-CM | POA: Diagnosis not present

## 2020-03-28 DIAGNOSIS — Z79890 Hormone replacement therapy (postmenopausal): Secondary | ICD-10-CM | POA: Diagnosis not present

## 2020-03-28 DIAGNOSIS — E0500 Thyrotoxicosis with diffuse goiter without thyrotoxic crisis or storm: Secondary | ICD-10-CM | POA: Insufficient documentation

## 2020-03-28 DIAGNOSIS — Z950 Presence of cardiac pacemaker: Secondary | ICD-10-CM | POA: Diagnosis not present

## 2020-03-28 DIAGNOSIS — K449 Diaphragmatic hernia without obstruction or gangrene: Secondary | ICD-10-CM | POA: Diagnosis not present

## 2020-03-28 DIAGNOSIS — Z79899 Other long term (current) drug therapy: Secondary | ICD-10-CM | POA: Diagnosis not present

## 2020-03-28 HISTORY — DX: Prediabetes: R73.03

## 2020-03-28 HISTORY — DX: Cardiac arrhythmia, unspecified: I49.9

## 2020-03-28 LAB — CBC
HCT: 43.8 % (ref 36.0–46.0)
Hemoglobin: 14.5 g/dL (ref 12.0–15.0)
MCH: 30.5 pg (ref 26.0–34.0)
MCHC: 33.1 g/dL (ref 30.0–36.0)
MCV: 92.2 fL (ref 80.0–100.0)
Platelets: 250 10*3/uL (ref 150–400)
RBC: 4.75 MIL/uL (ref 3.87–5.11)
RDW: 12.7 % (ref 11.5–15.5)
WBC: 5.3 10*3/uL (ref 4.0–10.5)
nRBC: 0 % (ref 0.0–0.2)

## 2020-03-28 LAB — HEMOGLOBIN A1C
Hgb A1c MFr Bld: 5.5 % (ref 4.8–5.6)
Mean Plasma Glucose: 111.15 mg/dL

## 2020-03-28 NOTE — Progress Notes (Signed)
PERIOPERATIVE PRESCRIPTION FOR IMPLANTED CARDIAC DEVICE PROGRAMMING  Patient Information: Name:  Cindy [name]  DOB:  28-Nov-1945  MRN:  [id number]    Planned Procedure: Laparoscopic repair of hernia  Surgeon: Dr. Johnathan Hausen  Date of Procedure: 04/10/20  Cautery will be used.  Position during surgery:    Please send documentation back to:  [facility] (Fax # [phone])   Device Information:  Clinic EP Physician:  Virl Axe, MD   Device Type:  Pacemaker Manufacturer and Phone #:  Biotronik: [phone] Pacemaker Dependent?:  No. Date of Last Device Check:  05/31/19           Normal Device Function?:  Yes.    Electrophysiologist's Recommendations:   Have magnet available.  Provide continuous ECG monitoring when magnet is used or reprogramming is to be performed.   Procedure may interfere with device function.  Magnet should be placed over device during procedure.  Per Device Clinic Standing Orders, TITILAYO HAGANS, RN  2:23 PM 03/27/2020       Communication Routing History  None  No questionnaires available.

## 2020-03-28 NOTE — Progress Notes (Signed)
COVID Vaccine Completed: Yes Date COVID Vaccine completed: 01/26/20 COVID vaccine manufacturer: Pfizer     PCP - Dr. Cristie Hem Cardiologist - Dr. Dr. Virl Axe  Chest x-ray - 11/02/19 EKG - 01/18/20 Stress Test -  ECHO -  Cardiac Cath -  Pacemaker/ICD device last checked:  Sleep Study -  CPAP -   Fasting Blood Sugar -  Checks Blood Sugar _____ times a day  Blood Thinner Instructions: Aspirin Instructions: Last Dose:  Anesthesia review: Hx: HTN,pacemaker.  Patient denies shortness of breath, fever, cough and chest pain at PAT appointment   Patient verbalized understanding of instructions that were given to them at the PAT appointment. Patient was also instructed that they will need to review over the PAT instructions again at home before surgery.

## 2020-03-28 NOTE — Progress Notes (Signed)
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 Has been notified about pt's surgery.

## 2020-03-29 ENCOUNTER — Ambulatory Visit (INDEPENDENT_AMBULATORY_CARE_PROVIDER_SITE_OTHER): Payer: PPO | Admitting: Student

## 2020-03-29 ENCOUNTER — Encounter: Payer: Self-pay | Admitting: Student

## 2020-03-29 VITALS — BP 144/70 | HR 89 | Ht 62.0 in | Wt 163.0 lb

## 2020-03-29 DIAGNOSIS — Z950 Presence of cardiac pacemaker: Secondary | ICD-10-CM

## 2020-03-29 DIAGNOSIS — I10 Essential (primary) hypertension: Secondary | ICD-10-CM

## 2020-03-29 DIAGNOSIS — R55 Syncope and collapse: Secondary | ICD-10-CM | POA: Diagnosis not present

## 2020-03-29 DIAGNOSIS — I471 Supraventricular tachycardia: Secondary | ICD-10-CM

## 2020-03-29 LAB — CUP PACEART INCLINIC DEVICE CHECK
Date Time Interrogation Session: 20211223121517
Implantable Lead Implant Date: 20120606
Implantable Lead Implant Date: 20120606
Implantable Lead Location: 753859
Implantable Lead Location: 753860
Implantable Lead Model: 5076
Implantable Lead Model: 5076
Implantable Pulse Generator Implant Date: 20120606
Lead Channel Impedance Value: 409 Ohm
Lead Channel Impedance Value: 487 Ohm
Lead Channel Pacing Threshold Amplitude: 0.7 V
Lead Channel Pacing Threshold Amplitude: 0.7 V
Lead Channel Pacing Threshold Amplitude: 1 V
Lead Channel Pacing Threshold Amplitude: 1 V
Lead Channel Pacing Threshold Pulse Width: 0.4 ms
Lead Channel Pacing Threshold Pulse Width: 0.4 ms
Lead Channel Pacing Threshold Pulse Width: 0.4 ms
Lead Channel Pacing Threshold Pulse Width: 0.4 ms
Lead Channel Sensing Intrinsic Amplitude: 13.5 mV
Lead Channel Sensing Intrinsic Amplitude: 5.7 mV
Lead Channel Sensing Intrinsic Amplitude: 5.7 mV
Lead Channel Setting Pacing Amplitude: 2 V
Lead Channel Setting Pacing Amplitude: 2.4 V
Lead Channel Setting Pacing Pulse Width: 0.4 ms
Pulse Gen Serial Number: 66089338

## 2020-03-29 NOTE — Progress Notes (Signed)
Electrophysiology Office Note Date: 03/29/2020  ID:  Cindy [name], DOB 02-14-1946, MRN [id number]  PCP: Chelsea Nusz [place], MD Primary Cardiologist: No primary care provider on file. Electrophysiologist: Virl Axe, MD   CC: Pacemaker follow-up  Cindy [name] is a 74 y.o. female seen today for Virl Axe, MD for cardiac clearance for hernia repair.  Since last being seen in our clinic the patient reports doing well overall.  she denies chest pain, palpitations, dyspnea, PND, orthopnea, nausea, vomiting, dizziness, syncope, edema, weight gain, or early satiety.  Device History: Biotronik Dual Chamber PPM implanted 09/2010 for for CHB  Past Medical History:  Diagnosis Date  . Abnormal nuclear stress test 12/15/2013  . Anemia    hx iron def, no GI loss indentifed - resolved with diet change    . Anxiety   . Arthritis   . Dysrhythmia   . GRAVES' DISEASE 2013   I-131 ablation, now post tx hypothyroid state  . HYPERTENSION   . Impaired glucose tolerance test   . Keloid 12/31/2010   incisional   . Pacemaker -Biotronik-CLS 08/20/2010   Fainted easily  . Postablative hypothyroidism    Qualifier: Diagnosis of  By: Joya Gaskins CMA, [place]    . Pre-diabetes   . Sciatica   . Syncope 08/20/2010   s/p PPM 08/2010 for sxc pause on loop recorder causing same  . Unspecified vitamin D deficiency    Past Surgical History:  Procedure Laterality Date  . APPENDECTOMY    . blephroplasty    . BREAST BIOPSY Left 2001   guided excisional bx (L) breast microcalcification at 12 o'clock- Benign  . BUNIONECTOMY Left   . CATARACT EXTRACTION  2014  . LEFT HEART CATHETERIZATION WITH CORONARY ANGIOGRAM N/A 12/22/2013   Procedure: LEFT HEART CATHETERIZATION WITH CORONARY ANGIOGRAM;  Surgeon: Sinclair Grooms, MD;  Location: [facility] CATH LAB;  Service: Cardiovascular;  Laterality: N/A;  . PACEMAKER INSERTION    . Right ankle  06/2010   Trimalleolar fracture  . TOTAL SHOULDER ARTHROPLASTY Left 01/27/2019    Procedure: TOTAL SHOULDER ARTHROPLASTY;  Surgeon: Tania Ade, MD;  Location: [facility] ORS;  Service: Orthopedics;  Laterality: Left;    Current Outpatient Medications  Medication Sig Dispense Refill  . acetaminophen (TYLENOL) 650 MG CR tablet Take 1,300 mg by mouth every 8 (eight) hours as needed for pain.    Marland Kitchen ALPRAZolam (XANAX) 0.5 MG tablet Take 0.25 mg by mouth 2 (two) times daily as needed for anxiety.  0  . amLODipine (NORVASC) 5 MG tablet Take 1 tablet (5 mg total) by mouth daily. 90 tablet 1  . Cholecalciferol (VITAMIN D3) 10 MCG (400 UNIT) tablet 800 mg daily in the afternoon.    Marland Kitchen levothyroxine (SYNTHROID) 125 MCG tablet Take 125 mcg by mouth daily before breakfast.    . Multiple Vitamins-Minerals (EQ MULTIVITAMINS ADULT GUMMY PO) daily in the afternoon.    . nebivolol (BYSTOLIC) 5 MG tablet Take 5 mg by mouth daily.    . pantoprazole (PROTONIX) 40 MG tablet Take 40 mg by mouth daily.    Marland Kitchen spironolactone (ALDACTONE) 50 MG tablet Take 50 mg by mouth daily.     No current facility-administered medications for this visit.    Allergies:   Ace inhibitors, Lisinopril, and Codeine   Social History: Social History   Socioeconomic History  . Marital status: Married    Spouse name: Not on file  . Number of children: Not on file  . Years of education: Not  on file  . Highest education level: Not on file  Occupational History  . Not on file  Tobacco Use  . Smoking status: Former Smoker    Types: Cigarettes    Quit date: 04/08/1967    Years since quitting: 53.0  . Smokeless tobacco: Never Used  Vaping Use  . Vaping Use: Never used  Substance and Sexual Activity  . Alcohol use: Yes    Alcohol/week: 2.0 standard drinks    Types: 1 Glasses of wine, 1 Cans of beer per week    Comment: OCCASIONAL  . Drug use: No  . Sexual activity: Not Currently  Other Topics Concern  . Not on file  Social History Narrative   ** Merged History Encounter **       Social Determinants of  Health   Financial Resource Strain: Not on file  Food Insecurity: Not on file  Transportation Needs: Not on file  Physical Activity: Not on file  Stress: Not on file  Social Connections: Not on file  Intimate Partner Violence: Not on file    Family History: Family History  Problem Relation Age of Onset  . Rheum arthritis Mother   . Emphysema Mother   . Hypertension Mother   . Osteoporosis Mother   . COPD Mother   . Prostate cancer Father   . Hypertension Father   . Heart attack Father   . Osteoporosis Maternal Grandmother   . Stroke Other      Review of Systems: All other systems reviewed and are otherwise negative except as noted above.  Physical Exam: Vitals:   03/29/20 0951  BP: (!) 144/70  Pulse: 89  SpO2: 98%  Weight: 163 lb (73.9 kg)  Height: 5\' 2"  (1.575 m)     GEN- The patient is well appearing, alert and oriented x 3 today.   HEENT: normocephalic, atraumatic; sclera clear, conjunctiva pink; hearing intact; oropharynx clear; neck supple  Lungs- Clear to ausculation bilaterally, normal work of breathing.  No wheezes, rales, rhonchi Heart- Regular rate and rhythm, no murmurs, rubs or gallops  GI- soft, non-tender, non-distended, bowel sounds present  Extremities- no clubbing or cyanosis. No edema MS- no significant deformity or atrophy Skin- warm and dry, no rash or lesion; PPM pocket well healed Psych- euthymic mood, full affect Neuro- strength and sensation are intact  PPM Interrogation- reviewed in detail today,  See PACEART report  EKG:  EKG is not ordered today.  Recent Labs: 11/02/2019: BUN 28; Creatinine, Ser 0.97; Potassium 3.9; Sodium 138 03/28/2020: Hemoglobin 14.5; Platelets 250   Wt Readings from Last 3 Encounters:  03/29/20 163 lb (73.9 kg)  03/28/20 162 lb (73.5 kg)  11/02/19 160 lb (72.6 kg)     Other studies Reviewed: Additional studies/ records that were reviewed today include: Previous EP office notes, Previous remote checks,  Most recent labwork.   Assessment and Plan:  1. CHB s/p Biotronik PPM  Normal PPM function See Pace Art report No changes today  2. Atrial tachycardia Known. Burden remains low.   3. HTN Continue current regimen  4. Cardiac Clearance for hernia repair Device clearance noted in chart.  Ok to proceed from cardiac perspective without further work up.  Pt has relatively "low risk" of perioperative complications from a cardiac perspective by the Revised Cardiac Risk Index [place] Criteria)    Current medicines are reviewed at length with the patient today.   The patient does not have concerns regarding her medicines.  The following changes were made today:  none  Labs/ tests ordered today include:  No orders of the defined types were placed in this encounter.    Disposition:   Follow up with Dr. Caryl Comes in 6 Months    Signed, Annamaria Helling  03/29/2020 10:00 AM  [address] [place]  [place] [address] [phone] (office) [phone] (fax)

## 2020-03-29 NOTE — Patient Instructions (Signed)
Medication Instructions:  *If you need a refill on your cardiac medications before your next appointment, please call your pharmacy*  Follow-Up: At [facility], you and your health needs are our priority.  As part of our continuing mission to provide you with exceptional heart care, we have created designated Provider Care Teams.  These Care Teams include your primary Cardiologist (physician) and Advanced Practice Providers (APPs -  Physician Assistants and Nurse Practitioners) who all work together to provide you with the care you need, when you need it.  We recommend signing up for the patient portal called "MyChart".  Sign up information is provided on this After Visit Summary.  MyChart is used to connect with patients for Virtual Visits (Telemedicine).  Patients are able to view lab/test results, encounter notes, upcoming appointments, etc.  Non-urgent messages can be sent to your provider as well.   To learn more about what you can do with MyChart, go to [email or web address].    Your next appointment:   Your physician recommends that you schedule a follow-up appointment in: 6 MONTHS with Dr. Klein.  The format for your next appointment:   In Person with Steven Klein, MD     

## 2020-04-04 NOTE — H&P (Signed)
Cindy [name] Appointment: 02/24/2020 2:00 PM Location: [facility] Cindy [name] Patient #: [id number] DOB: [birth year] Married / Language: English / Race: White Female   History of Present Illness Cindy [name] B. Daphine Deutscher MD; 02/24/2020 3:56 PM) The patient is a 74 year old female who presents with a hiatal hernia. Cindy [name] is a 74 year old WF who has a very interesting history of vagal reactions producing syncope. She sees Dr. Graciela Husbands for this. She has some interesting history that might be related to pressure from her hiatal hernia pressing her lungs. I looked at her upper GI report and she's getting about two thirds of her stomach in her chest. They've been reducing this from the chest her lungs reexpanded and no improved some of her breathing difficulties that she has when she exerts herself.  After drawing out the hiatal hernia repair in some detail and talked anteriorly and extensively she decided she was to have [name]. We will schedule this at her convenience.   Past Surgical History (Cindy [name], CMA; 02/24/2020 2:40 PM) Appendectomy  Breast Biopsy  Left. Cataract [name]  Left. Foot [name]  Left. Shoulder [name]  Left.  Diagnostic Studies History (Cindy [name], CMA; 02/24/2020 2:40 PM) Colonoscopy  within last year Mammogram  within last year  Allergies (Cindy [name], CMA; 02/24/2020 2:40 PM) Codeine Phosphate *ANALGESICS - OPIOID*  ACE Inhibitors  Allergies Reconciled   Social History (Cindy [name], CMA; 02/24/2020 2:40 PM) Caffeine use  Coffee. No drug use  Tobacco use  Former smoker.  Family History Micheal [name], CMA; 02/24/2020 2:40 PM) Arthritis  Father, Mother, Sister. Cancer  Mother. Cerebrovascular Accident  Father. Hypertension  Father. Migraine Headache  Sister. Prostate Cancer  Father. Respiratory Condition  Mother.  Pregnancy / Birth History Micheal [name], CMA; 02/24/2020 2:40 PM) Age at menarche  12 years. Age of  menopause  51-55 Contraceptive History  Contraceptive implant, Oral contraceptives. Gravida  0 Para  0  Other Problems (Cindy [name], CMA; 02/24/2020 2:40 PM) Arthritis  Back Pain  High blood pressure  Other disease, cancer, significant illness  Thyroid Disease     Review of Systems (Cindy Nolan CMA; 02/24/2020 2:40 PM) General Present- Fatigue. Not Present- Appetite Loss, Chills, Fever, Night Sweats, Weight Gain and Weight Loss. Skin Not Present- Change in Wart/Mole, Dryness, Hives, Jaundice, New Lesions, Non-Healing Wounds, Rash and Ulcer. HEENT Present- Wears glasses/contact lenses. Not Present- Earache, Hearing Loss, Hoarseness, Nose Bleed, Oral Ulcers, Ringing in the Ears, Seasonal Allergies, Sinus Pain, Sore Throat, Visual Disturbances and Yellow Eyes. Respiratory Present- Wheezing. Not Present- Bloody sputum, Chronic Cough, Difficulty Breathing and Snoring. Cardiovascular Present- Shortness of Breath. Not Present- Chest Pain, Difficulty Breathing Lying Down, Leg Cramps, Palpitations, Rapid Heart Rate and Swelling of Extremities. Gastrointestinal Present- Excessive gas. Not Present- Abdominal Pain, Bloating, Bloody Stool, Change in Bowel Habits, Chronic diarrhea, Constipation, Difficulty Swallowing, Gets full quickly at meals, Hemorrhoids, Indigestion, Nausea, Rectal Pain and Vomiting. Female Genitourinary Not Present- Frequency, Nocturia, Painful Urination, Pelvic Pain and Urgency. Musculoskeletal Present- Back Pain, Joint Pain and Joint Stiffness. Not Present- Muscle Pain, Muscle Weakness and Swelling of Extremities. Neurological Not Present- Decreased Memory, Fainting, Headaches, Numbness, Seizures, Tingling, Tremor, Trouble walking and Weakness. Psychiatric Not Present- Anxiety, Bipolar, Change in Sleep Pattern, Depression, Fearful and Frequent crying. Endocrine Not Present- Cold Intolerance, Excessive Hunger, Hair Changes, Heat Intolerance, Hot flashes and New  Diabetes. Hematology Present- Easy Bruising. Not Present- Blood Thinners, Excessive bleeding, Gland problems, HIV and Persistent Infections.  Vitals (Cindy Nolan CMA; 02/24/2020 2:41  PM) 02/24/2020 2:40 PM Weight: 161.5 lb Height: 62in Body Surface Area: 1.75 m Body Mass Index: 29.54 kg/m  Temp.: 97.49F  Pulse: 77 (Regular)  BP: 128/80(Sitting, Left Arm, Standard)       Physical Exam (Sosha Shepherd B. Daphine Deutscher MD; 02/24/2020 3:57 PM) General Note: Well maintained WF NAD HEENT glasses Neck no bruits Chest clear Heart SR without murmurs; pacemaker on the right below the clavicle Abdomen is nontender with prior lap appy by Carolynne Edouard.  Ext FROM Neuro alert and oriented x 3     Assessment & Plan Cindy [name] B. Daphine Deutscher MD; 02/24/2020 3:59 PM) HIATAL HERNIA (K44.9) Impression: Large type III hiatal hernia with restrictive lung symptoms. Will schedule for lap hiatal hernia repair  Signed by Valarie Merino, MD (02/24/2020 4:01 PM)

## 2020-04-04 NOTE — Progress Notes (Signed)
Anesthesia Chart Review   Case: [id number] Date/Time: 04/10/20 1045   Procedure: LAPAROSCOPIC REPAIR OF TYPE 3 HIATAL HERNIA (N/A )   Anesthesia type: General   Pre-op diagnosis: LARGE TYPE 3 HIATAL HERNIA   Location: [facility] ROOM 01 / [facility]   Surgeons: Luretha Murphy, MD      DISCUSSION:74 y.o. former smoker with h/o HTN, Grave's disease, pacemaker in place due to CHB (device orders in progress note 03/27/20), hiatal hernia scheduled for above procedure 04/10/2020 with Dr. Luretha Murphy.   Pt last seen by cardiology 03/29/2020. Per OV note, "Ok to proceed from cardiac perspective without further work up. Pt has relatively "low risk" of perioperative complications from a cardiac perspective by the Revised Cardiac Risk Index Nedra Hai Criteria)"  Anticipate pt can proceed with planned procedure barring acute status change.   VS: BP (!) 149/79   Pulse (!) 58   Temp 37 C (Oral)   Ht 5\' 2"  (1.575 m)   Wt 73.5 kg   SpO2 99%   BMI 29.63 kg/m   PROVIDERS: , MD is PCP   Melida Quitter, MD is Cardiologist  LABS: Labs reviewed: Acceptable for surgery. (all labs ordered are listed, but only abnormal results are displayed)  Labs Reviewed  CBC  HEMOGLOBIN A1C     IMAGES:   EKG: 01/18/2020 Rate 81 bpm  Sinus rhythm with occasional PVCs  CV: Echo 03/28/2010 Study Conclusions   - Left ventricle: The cavity size was normal. Wall thickness was   normal. Systolic function was normal. The estimated ejection   fraction was in the range of 60% to 65%. Wall motion was normal;   there were no regional wall motion abnormalities. Doppler   parameters are consistent with abnormal left ventricular   relaxation (grade 1 diastolic dysfunction).  - Left atrium: The atrium was mildly to moderately dilated.   Past Medical History:  Diagnosis Date  . Abnormal nuclear stress test 12/15/2013  . Anemia    hx iron def, no GI loss indentifed - resolved with diet change    .  Anxiety   . Arthritis   . Dysrhythmia   . GRAVES' DISEASE 2013   I-131 ablation, now post tx hypothyroid state  . HYPERTENSION   . Impaired glucose tolerance test   . Keloid 12/31/2010   incisional   . Pacemaker -Biotronik-CLS 08/20/2010   Fainted easily  . Postablative hypothyroidism    Qualifier: Diagnosis of  By: 08/22/2010 CMA, Nova    . Pre-diabetes   . Sciatica   . Syncope 08/20/2010   s/p PPM 08/2010 for sxc pause on loop recorder causing same  . Unspecified vitamin D deficiency     Past Surgical History:  Procedure Laterality Date  . APPENDECTOMY    . blephroplasty    . BREAST BIOPSY Left 2001   guided excisional bx (L) breast microcalcification at 12 o'clock- Benign  . BUNIONECTOMY Left   . CATARACT EXTRACTION  2014  . LEFT HEART CATHETERIZATION WITH CORONARY ANGIOGRAM N/A 12/22/2013   Procedure: LEFT HEART CATHETERIZATION WITH CORONARY ANGIOGRAM;  Surgeon: 12/24/2013, MD;  Location: [facility] C[facility] LAB;  Service: Cardiovascular;  Laterality: N/A;  . PACEMAKER INSERTION    . Right ankle  06/2010   Trimalleolar fracture  . TOTAL SHOULDER ARTHROPLASTY Left 01/27/2019   Procedure: TOTAL SHOULDER ARTHROPLASTY;  Surgeon: 01/29/2019, MD;  Location: [facility];  Service: Orthopedics;  Laterality: Left;    MEDICATIONS: . acetaminophen (TYLENOL) 650 MG CR tablet  .  ALPRAZolam (XANAX) 0.5 MG tablet  . amLODipine (NORVASC) 5 MG tablet  . Cholecalciferol (VITAMIN D3) 10 MCG (400 UNIT) tablet  . levothyroxine (SYNTHROID) 125 MCG tablet  . Multiple Vitamins-Minerals (EQ MULTIVITAMINS ADULT GUMMY PO)  . nebivolol (BYSTOLIC) 5 MG tablet  . pantoprazole (PROTONIX) 40 MG tablet  . spironolactone (ALDACTONE) 50 MG tablet   No current facility-administered medications for this encounter.     Konrad Felix, PA-C [facility] [phone]

## 2020-04-04 NOTE — H&P (Addendum)
Chief Complaint: Hiatal hernia  History of Present Illness:  Cindy [name] is an 74 y.o. female that I saw in the office on November 19 with a hiatal hernia that something may be producing vagal symptoms producing syncope.  Her upper GI was reviewed by me and about two thirds of her stomach is in her chest.  We went over her hiatal hernia repair in detail and she presents for repair at this time.  Past Medical History:  Diagnosis Date  . Abnormal nuclear stress test 12/15/2013  . Anemia    hx iron def, no GI loss indentifed - resolved with diet change    . Anxiety   . Arthritis   . Dysrhythmia   . GRAVES' DISEASE 2013   I-131 ablation, now post tx hypothyroid state  . HYPERTENSION   . Impaired glucose tolerance test   . Keloid 12/31/2010   incisional   . Pacemaker -Biotronik-CLS 08/20/2010   Fainted easily  . Postablative hypothyroidism    Qualifier: Diagnosis of  By: Delford Field CMA, Nova    . Pre-diabetes   . Sciatica   . Syncope 08/20/2010   s/p PPM 08/2010 for sxc pause on loop recorder causing same  . Unspecified vitamin D deficiency     Past Surgical History:  Procedure Laterality Date  . APPENDECTOMY    . blephroplasty    . BREAST BIOPSY Left 2001   guided excisional bx (L) breast microcalcification at 12 o'clock- Benign  . BUNIONECTOMY Left   . CATARACT EXTRACTION  2014  . LEFT HEART CATHETERIZATION WITH CORONARY ANGIOGRAM N/A 12/22/2013   Procedure: LEFT HEART CATHETERIZATION WITH CORONARY ANGIOGRAM;  Surgeon: Lesleigh Noe, MD;  Location[facility] C[facility] LAB;  Service: Cardiovascular;  Laterality: N/A;  . PACEMAKER INSERTION    . Right ankle  06/2010   Trimalleolar fracture  . TOTAL SHOULDER ARTHROPLASTY Left 01/27/2019   Procedure: TOTAL SHOULDER ARTHROPLASTY;  Surgeon: Jones Broom, MD;  Location: [facility];  Service: Orthopedics;  Laterality: Left;    No current facility-administered medications for this encounter.   Current Outpatient Medications  Medication Sig  Dispense Refill  . acetaminophen (TYLENOL) 650 MG CR tablet Take 1,300 mg by mouth every 8 (eight) hours as needed for pain.    Marland Kitchen ALPRAZolam (XANAX) 0.5 MG tablet Take 0.25 mg by mouth 2 (two) times daily as needed for anxiety.  0  . amLODipine (NORVASC) 5 MG tablet Take 1 tablet (5 mg total) by mouth daily. 90 tablet 1  . levothyroxine (SYNTHROID) 125 MCG tablet Take 125 mcg by mouth daily before breakfast.    . nebivolol (BYSTOLIC) 5 MG tablet Take 5 mg by mouth daily.    . pantoprazole (PROTONIX) 40 MG tablet Take 40 mg by mouth daily.    Marland Kitchen spironolactone (ALDACTONE) 50 MG tablet Take 50 mg by mouth daily.    . Cholecalciferol (VITAMIN D3) 10 MCG (400 UNIT) tablet 800 mg daily in the afternoon.    . Multiple Vitamins-Minerals (EQ MULTIVITAMINS ADULT GUMMY PO) daily in the afternoon.     Ace inhibitors, Lisinopril, and Codeine Family History  Problem Relation Age of Onset  . Rheum arthritis Mother   . Emphysema Mother   . Hypertension Mother   . Osteoporosis Mother   . COPD Mother   . Prostate cancer Father   . Hypertension Father   . Heart attack Father   . Osteoporosis Maternal Grandmother   . Stroke Other    Social History:   reports  that she quit smoking about 53 years ago. Her smoking use included cigarettes. She has never used smokeless tobacco. She reports current alcohol use of about 2.0 standard drinks of alcohol per week. She reports that she does not use drugs.   REVIEW OF SYSTEMS : Negative except for see problem list  Physical Exam:   There were no vitals taken for this visit. There is no height or weight on file to calculate BMI.  Gen:  WDWN white female NAD  Neurological: Alert and oriented to person, place, and time. Motor and sensory function is grossly intact  Head: Normocephalic and atraumatic.  Eyes: Conjunctivae are normal. Pupils are equal, round, and reactive to light. No scleral icterus.  Neck: Normal range of motion. Neck supple. No tracheal deviation  or thyromegaly present.  Cardiovascular:  SR without murmurs or gallops.  No carotid bruits Breast: Not examined Respiratory: Effort normal.  No respiratory distress. No chest wall tenderness. Breath sounds normal.  No wheezes, rales or rhonchi.  Abdomen: Nontender without masses GU: Not examined Musculoskeletal: Normal range of motion. Extremities are nontender. No cyanosis, edema or clubbing noted Lymphadenopathy: No cervical, preauricular, postauricular or axillary adenopathy is present Skin: Skin is warm and dry. No rash noted. No diaphoresis. No erythema. No pallor. Pscyh: Normal mood and affect. Behavior is normal. Judgment and thought content normal.   LABORATORY RESULTS: No results found for this or any previous visit (from the past 48 hour(s)).   RADIOLOGY RESULTS: No results found.  Problem List: Patient Active Problem List   Diagnosis Date Noted  . Atrial tachycardia (HCC) 06/15/2019  . Status post total shoulder arthroplasty, left 01/27/2019  . 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 Anemia, iron deficiency 05/07/2015  . 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 Osteopenia 05/07/2015  . Neurocardiogenic syncope 05/07/2015  . Unable to lose weight 04/24/2014  . Lumbar radiculopathy, chronic 01/03/2014  . Abnormal nuclear stress test 12/15/2013  . Hyperthyroidism 10/06/2011  . Syncope 08/20/2010  . Pacemaker -Biotronik-CLS 08/20/2010  . Essential hypertension 04/15/2010  . PREMATURE VENTRICULAR CONTRACTIONS 04/15/2010  . GERD 04/15/2010  . Postablative hypothyroidism     Assessment & Plan: 74 year old female with a large type III mixed hiatal hernia and some syncopal episodes followed by Dr. Graciela Husbands to be related to her hiatal hernia producing a vagal symptoms.  Plan laparoscopic repair of her hiatal hernia with possible Nissen fundoplication.    Matt B. Daphine Deutscher, MD, [facility] [phone] beeper [phone]  04/09/2020 3:08 PM

## 2020-04-04 NOTE — Anesthesia Preprocedure Evaluation (Addendum)
Anesthesia Evaluation  Patient identified by MRN, date of birth, ID band Patient awake    Reviewed: Allergy & Precautions, NPO status , Patient's Chart, lab work & pertinent test results, reviewed documented beta blocker date and time   Airway Mallampati: I       Dental no notable dental hx.    Pulmonary former smoker,    Pulmonary exam normal        Cardiovascular hypertension, Pt. on medications and Pt. on home beta blockers Normal cardiovascular exam+ dysrhythmias Supra Ventricular Tachycardia + pacemaker  Rhythm:Regular Rate:Normal     Neuro/Psych    GI/Hepatic GERD  Medicated and Controlled,  Endo/Other  Hypothyroidism   Renal/GU      Musculoskeletal   Abdominal Normal abdominal exam  (+)   Peds  Hematology   Anesthesia Other Findings  Indications  Atrial tachycardia (HCC) (I47.1 (ICD-10-CM))   Conclusion  Pacemaker check in clinic. Normal device function. Thresholds, sensing, impedances consistent with previous measurements. Device programmed to maximize longevity. Low burden of AT, known. Occasional NSVT, known. Device programmed at appropriate safety  margins. Histogram distribution appropriate for patient activity level. Estimated longevity 4 yr, 9 mo. Device not remote capable. Continue q 6 month office visits. Patient education completed.  Medications   Indications  Atrial tachycardia (HCC) (I47.1 (ICD-10-CM))   Conclusion  Pacemaker check in clinic. Normal device function. Thresholds, sensing, impedances consistent with previous measurements. Device programmed to maximize longevity. Low burden of AT, known. Occasional NSVT, known. Device programmed at appropriate safety  margins. Histogram distribution appropriate for patient activity level. Estimated longevity 4 yr, 9 mo. Device not remote capable. Continue q 6 month office visits. Patient education completed.  Medications       Reproductive/Obstetrics                            Anesthesia Physical Anesthesia Plan  ASA: III  Anesthesia Plan: General   Post-op Pain Management:    Induction: Intravenous  PONV Risk Score and Plan: 4 or greater and Ondansetron, Dexamethasone and Treatment may vary due to age or medical condition  Airway Management Planned: Oral ETT  Additional Equipment: None  Intra-op Plan:   Post-operative Plan: Extubation in OR  Informed Consent: I have reviewed the patients History and Physical, chart, labs and discussed the procedure including the risks, benefits and alternatives for the proposed anesthesia with the patient or authorized representative who has indicated his/her understanding and acceptance.     Dental advisory given  Plan Discussed with: CRNA  Anesthesia Plan Comments: (See PAT note 03/28/2020, Jodell Cipro, PA-C)       Anesthesia Quick Evaluation                                  Anesthesia Evaluation  Patient identified by MRN, date of birth, ID band Patient awake    Reviewed: Allergy & Precautions, NPO status , Patient's Chart, lab work & pertinent test results  History of Anesthesia Complications Negative for: history of anesthetic complications  Airway Mallampati: II  TM Distance: >3 FB Neck ROM: Full    Dental  (+) Dental Advisory Given   Pulmonary neg shortness of breath, neg recent URI, former smoker,    breath sounds clear to auscultation       Cardiovascular hypertension, Pt. on medications (-) angina(-) Past MI and (-) CHF + dysrhythmias + pacemaker  Rhythm:Regular  Biotronik pacer dddr 43% RA pacing   Neuro/Psych PSYCHIATRIC DISORDERS Anxiety  Neuromuscular disease    GI/Hepatic Neg liver ROS,   Endo/Other  Hypothyroidism   Renal/GU negative Renal ROS     Musculoskeletal   Abdominal   Peds  Hematology   Anesthesia Other Findings   Reproductive/Obstetrics                            Anesthesia Physical Anesthesia Plan  ASA: II  Anesthesia Plan: General and Regional   Post-op Pain Management:  Regional for Post-op pain   Induction: Intravenous  PONV Risk Score and Plan: 3 and Ondansetron and Dexamethasone  Airway Management Planned: Oral ETT  Additional Equipment: None  Intra-op Plan:   Post-operative Plan: Extubation in OR  Informed Consent: I have reviewed the patients History and Physical, chart, labs and discussed the procedure including the risks, benefits and alternatives for the proposed anesthesia with the patient or authorized representative who has indicated his/her understanding and acceptance.     Dental advisory given  Plan Discussed with: CRNA and Surgeon  Anesthesia Plan Comments: (See PAT note 01/17/2019, Konrad Felix, PA-C)       Anesthesia Quick Evaluation

## 2020-04-06 ENCOUNTER — Other Ambulatory Visit (HOSPITAL_COMMUNITY): Payer: PPO

## 2020-04-09 ENCOUNTER — Other Ambulatory Visit (HOSPITAL_COMMUNITY)
Admission: RE | Admit: 2020-04-09 | Discharge: 2020-04-09 | Disposition: A | Discharge status: Home / Self Care | Payer: PPO | Source: Ambulatory Visit | Attending: Surgery | Admitting: Surgery

## 2020-04-09 DIAGNOSIS — Z20822 Contact with and (suspected) exposure to covid-19: Secondary | ICD-10-CM | POA: Insufficient documentation

## 2020-04-09 DIAGNOSIS — Z888 Allergy status to other drugs, medicaments and biological substances status: Secondary | ICD-10-CM | POA: Diagnosis not present

## 2020-04-09 DIAGNOSIS — Z01812 Encounter for preprocedural laboratory examination: Secondary | ICD-10-CM | POA: Insufficient documentation

## 2020-04-09 DIAGNOSIS — R7303 Prediabetes: Secondary | ICD-10-CM | POA: Diagnosis present

## 2020-04-09 DIAGNOSIS — K449 Diaphragmatic hernia without obstruction or gangrene: Secondary | ICD-10-CM | POA: Diagnosis present

## 2020-04-09 DIAGNOSIS — Z96612 Presence of left artificial shoulder joint: Secondary | ICD-10-CM | POA: Diagnosis present

## 2020-04-09 DIAGNOSIS — K219 Gastro-esophageal reflux disease without esophagitis: Secondary | ICD-10-CM | POA: Diagnosis present

## 2020-04-09 DIAGNOSIS — Z79890 Hormone replacement therapy (postmenopausal): Secondary | ICD-10-CM | POA: Diagnosis not present

## 2020-04-09 DIAGNOSIS — Z8249 Family history of ischemic heart disease and other diseases of the circulatory system: Secondary | ICD-10-CM | POA: Diagnosis not present

## 2020-04-09 DIAGNOSIS — Z79899 Other long term (current) drug therapy: Secondary | ICD-10-CM | POA: Diagnosis not present

## 2020-04-09 DIAGNOSIS — Z87891 Personal history of nicotine dependence: Secondary | ICD-10-CM | POA: Diagnosis not present

## 2020-04-09 DIAGNOSIS — E890 Postprocedural hypothyroidism: Secondary | ICD-10-CM | POA: Diagnosis present

## 2020-04-09 DIAGNOSIS — D509 Iron deficiency anemia, unspecified: Secondary | ICD-10-CM | POA: Diagnosis not present

## 2020-04-09 DIAGNOSIS — I10 Essential (primary) hypertension: Secondary | ICD-10-CM | POA: Diagnosis present

## 2020-04-09 DIAGNOSIS — Z885 Allergy status to narcotic agent status: Secondary | ICD-10-CM | POA: Diagnosis not present

## 2020-04-09 DIAGNOSIS — E0590 Thyrotoxicosis, unspecified without thyrotoxic crisis or storm: Secondary | ICD-10-CM | POA: Diagnosis not present

## 2020-04-09 DIAGNOSIS — Z950 Presence of cardiac pacemaker: Secondary | ICD-10-CM | POA: Diagnosis not present

## 2020-04-09 LAB — SARS CORONAVIRUS 2 (TAT 6-24 HRS): SARS Coronavirus 2: NEGATIVE

## 2020-04-10 ENCOUNTER — Encounter (HOSPITAL_COMMUNITY): Payer: Self-pay | Admitting: Surgery

## 2020-04-10 ENCOUNTER — Encounter (HOSPITAL_COMMUNITY)
Admission: RE | Disposition: A | Discharge status: Home / Self Care | Payer: Self-pay | Source: Home / Self Care | Attending: Surgery

## 2020-04-10 ENCOUNTER — Ambulatory Visit (HOSPITAL_COMMUNITY): Payer: PPO | Admitting: Physician Assistant

## 2020-04-10 ENCOUNTER — Inpatient Hospital Stay (HOSPITAL_COMMUNITY)
Admission: RE | Admit: 2020-04-10 | Discharge: 2020-04-12 | DRG: 328 | Disposition: A | Discharge status: Home / Self Care | Payer: PPO | Attending: Surgery | Admitting: Surgery

## 2020-04-10 ENCOUNTER — Other Ambulatory Visit: Payer: Self-pay

## 2020-04-10 ENCOUNTER — Ambulatory Visit (HOSPITAL_COMMUNITY): Payer: PPO | Admitting: Certified Registered Nurse Anesthetist

## 2020-04-10 DIAGNOSIS — E0590 Thyrotoxicosis, unspecified without thyrotoxic crisis or storm: Secondary | ICD-10-CM | POA: Diagnosis not present

## 2020-04-10 DIAGNOSIS — Z8249 Family history of ischemic heart disease and other diseases of the circulatory system: Secondary | ICD-10-CM | POA: Diagnosis not present

## 2020-04-10 DIAGNOSIS — Z87891 Personal history of nicotine dependence: Secondary | ICD-10-CM | POA: Diagnosis not present

## 2020-04-10 DIAGNOSIS — R7303 Prediabetes: Secondary | ICD-10-CM | POA: Diagnosis present

## 2020-04-10 DIAGNOSIS — K219 Gastro-esophageal reflux disease without esophagitis: Secondary | ICD-10-CM | POA: Diagnosis present

## 2020-04-10 DIAGNOSIS — Z950 Presence of cardiac pacemaker: Secondary | ICD-10-CM | POA: Diagnosis not present

## 2020-04-10 DIAGNOSIS — Z96612 Presence of left artificial shoulder joint: Secondary | ICD-10-CM | POA: Diagnosis present

## 2020-04-10 DIAGNOSIS — D509 Iron deficiency anemia, unspecified: Secondary | ICD-10-CM | POA: Diagnosis not present

## 2020-04-10 DIAGNOSIS — Z79899 Other long term (current) drug therapy: Secondary | ICD-10-CM | POA: Diagnosis not present

## 2020-04-10 DIAGNOSIS — Z79890 Hormone replacement therapy (postmenopausal): Secondary | ICD-10-CM | POA: Diagnosis not present

## 2020-04-10 DIAGNOSIS — I10 Essential (primary) hypertension: Secondary | ICD-10-CM | POA: Diagnosis present

## 2020-04-10 DIAGNOSIS — E890 Postprocedural hypothyroidism: Secondary | ICD-10-CM | POA: Diagnosis present

## 2020-04-10 DIAGNOSIS — Z9889 Other specified postprocedural states: Secondary | ICD-10-CM

## 2020-04-10 DIAGNOSIS — Z20822 Contact with and (suspected) exposure to covid-19: Secondary | ICD-10-CM | POA: Diagnosis present

## 2020-04-10 DIAGNOSIS — K449 Diaphragmatic hernia without obstruction or gangrene: Principal | ICD-10-CM | POA: Diagnosis present

## 2020-04-10 DIAGNOSIS — Z888 Allergy status to other drugs, medicaments and biological substances status: Secondary | ICD-10-CM

## 2020-04-10 DIAGNOSIS — Z885 Allergy status to narcotic agent status: Secondary | ICD-10-CM | POA: Diagnosis not present

## 2020-04-10 HISTORY — PX: HIATAL HERNIA REPAIR: SHX195

## 2020-04-10 LAB — CBC
HCT: 44.7 % (ref 36.0–46.0)
Hemoglobin: 14.6 g/dL (ref 12.0–15.0)
MCH: 31.1 pg (ref 26.0–34.0)
MCHC: 32.7 g/dL (ref 30.0–36.0)
MCV: 95.1 fL (ref 80.0–100.0)
Platelets: 237 10*3/uL (ref 150–400)
RBC: 4.7 MIL/uL (ref 3.87–5.11)
RDW: 13.1 % (ref 11.5–15.5)
WBC: 11.8 10*3/uL — ABNORMAL HIGH (ref 4.0–10.5)
nRBC: 0 % (ref 0.0–0.2)

## 2020-04-10 LAB — CREATININE, SERUM
Creatinine, Ser: 1.14 mg/dL — ABNORMAL HIGH (ref 0.44–1.00)
GFR, Estimated: 51 mL/min — ABNORMAL LOW (ref >60)

## 2020-04-10 SURGERY — REPAIR, HERNIA, HIATAL, LAPAROSCOPIC
Anesthesia: General | Site: Abdomen

## 2020-04-10 MED ORDER — ROCURONIUM BROMIDE 10 MG/ML (PF) SYRINGE
PREFILLED_SYRINGE | INTRAVENOUS | Status: AC
  Filled 2020-04-10: qty 10

## 2020-04-10 MED ORDER — OXYCODONE HCL 5 MG/5ML PO SOLN: 5.0000 mg | Freq: Once | ORAL | Status: DC | PRN

## 2020-04-10 MED ORDER — HYDROMORPHONE HCL 1 MG/ML IJ SOLN
INTRAMUSCULAR | Status: AC
  Filled 2020-04-10: qty 1

## 2020-04-10 MED ORDER — FENTANYL CITRATE (PF) 100 MCG/2ML IJ SOLN
INTRAMUSCULAR | Status: AC
  Filled 2020-04-10: qty 2

## 2020-04-10 MED ORDER — ACETAMINOPHEN 10 MG/ML IV SOLN: 1000.0000 mg | Freq: Once | INTRAVENOUS | Status: DC | PRN

## 2020-04-10 MED ORDER — DEXAMETHASONE SODIUM PHOSPHATE 10 MG/ML IJ SOLN
INTRAMUSCULAR | Status: AC
  Filled 2020-04-10: qty 1

## 2020-04-10 MED ORDER — ONDANSETRON HCL 4 MG/2ML IJ SOLN
INTRAMUSCULAR | Status: AC
  Filled 2020-04-10: qty 2

## 2020-04-10 MED ORDER — HEPARIN SODIUM (PORCINE) 5000 UNIT/ML IJ SOLN
5000.0000 [IU] | Freq: Three times a day (TID) | INTRAMUSCULAR | Status: DC
  Administered 2020-04-10 – 2020-04-12 (×5): 5000 [IU] via SUBCUTANEOUS
  Filled 2020-04-10 (×5): qty 1

## 2020-04-10 MED ORDER — OXYCODONE HCL 5 MG PO TABS: 5.0000 mg | ORAL_TABLET | Freq: Once | ORAL | Status: DC | PRN

## 2020-04-10 MED ORDER — PHENYLEPHRINE HCL-NACL 10-0.9 MG/250ML-% IV SOLN
INTRAVENOUS | Status: DC | PRN
  Administered 2020-04-10: 60 ug/min via INTRAVENOUS

## 2020-04-10 MED ORDER — ONDANSETRON 4 MG PO TBDP: 4.0000 mg | ORAL_TABLET | Freq: Four times a day (QID) | ORAL | Status: DC | PRN

## 2020-04-10 MED ORDER — FENTANYL CITRATE (PF) 100 MCG/2ML IJ SOLN
12.5000 ug | INTRAMUSCULAR | Status: DC | PRN
  Administered 2020-04-11 – 2020-04-12 (×2): 12.5 ug via INTRAVENOUS
  Filled 2020-04-10 (×2): qty 2

## 2020-04-10 MED ORDER — PHENYLEPHRINE 40 MCG/ML (10ML) SYRINGE FOR IV PUSH (FOR BLOOD PRESSURE SUPPORT)
PREFILLED_SYRINGE | INTRAVENOUS | Status: DC | PRN
  Administered 2020-04-10 (×2): 120 ug via INTRAVENOUS

## 2020-04-10 MED ORDER — METOPROLOL TARTRATE 5 MG/5ML IV SOLN: 5.0000 mg | Freq: Four times a day (QID) | INTRAVENOUS | Status: DC | PRN

## 2020-04-10 MED ORDER — PHENYLEPHRINE 40 MCG/ML (10ML) SYRINGE FOR IV PUSH (FOR BLOOD PRESSURE SUPPORT)
PREFILLED_SYRINGE | INTRAVENOUS | Status: AC
  Filled 2020-04-10: qty 10

## 2020-04-10 MED ORDER — LIDOCAINE 2% (20 MG/ML) 5 ML SYRINGE
INTRAMUSCULAR | Status: DC | PRN
  Administered 2020-04-10: 1 mg/kg/h via INTRAVENOUS

## 2020-04-10 MED ORDER — LACTATED RINGERS IR SOLN
Status: DC | PRN
  Administered 2020-04-10: 1000 mL

## 2020-04-10 MED ORDER — ONDANSETRON HCL 4 MG/2ML IJ SOLN: 4.0000 mg | Freq: Four times a day (QID) | INTRAMUSCULAR | Status: DC | PRN

## 2020-04-10 MED ORDER — PROPOFOL 10 MG/ML IV BOLUS
INTRAVENOUS | Status: DC | PRN
  Administered 2020-04-10: 180 mg via INTRAVENOUS

## 2020-04-10 MED ORDER — FENTANYL CITRATE (PF) 100 MCG/2ML IJ SOLN
25.0000 ug | INTRAMUSCULAR | Status: DC | PRN
  Administered 2020-04-10 (×3): 50 ug via INTRAVENOUS

## 2020-04-10 MED ORDER — CHLORHEXIDINE GLUCONATE CLOTH 2 % EX PADS: 6.0000 | MEDICATED_PAD | Freq: Once | CUTANEOUS | Status: DC

## 2020-04-10 MED ORDER — HYDROMORPHONE HCL 1 MG/ML IJ SOLN
0.2500 mg | INTRAMUSCULAR | Status: DC | PRN
  Administered 2020-04-10 (×3): 0.5 mg via INTRAVENOUS

## 2020-04-10 MED ORDER — SODIUM CHLORIDE (PF) 0.9 % IJ SOLN
INTRAMUSCULAR | Status: DC | PRN
  Administered 2020-04-10: 10 mL

## 2020-04-10 MED ORDER — LACTATED RINGERS IV SOLN: INTRAVENOUS | Status: DC

## 2020-04-10 MED ORDER — CEFAZOLIN SODIUM-DEXTROSE 2-4 GM/100ML-% IV SOLN
2.0000 g | INTRAVENOUS | Status: AC
  Administered 2020-04-10: 2 g via INTRAVENOUS
  Filled 2020-04-10: qty 100

## 2020-04-10 MED ORDER — MEPERIDINE HCL 50 MG/ML IJ SOLN: 6.2500 mg | INTRAMUSCULAR | Status: DC | PRN

## 2020-04-10 MED ORDER — KCL IN DEXTROSE-NACL 20-5-0.45 MEQ/L-%-% IV SOLN
INTRAVENOUS | Status: DC
  Filled 2020-04-10 (×2): qty 1000

## 2020-04-10 MED ORDER — SODIUM CHLORIDE (PF) 0.9 % IJ SOLN
INTRAMUSCULAR | Status: AC
  Filled 2020-04-10: qty 10

## 2020-04-10 MED ORDER — ORAL CARE MOUTH RINSE: 15.0000 mL | Freq: Once | OROMUCOSAL | Status: AC

## 2020-04-10 MED ORDER — PANTOPRAZOLE SODIUM 40 MG IV SOLR
40.0000 mg | Freq: Every day | INTRAVENOUS | Status: DC
  Administered 2020-04-10 – 2020-04-11 (×2): 40 mg via INTRAVENOUS
  Filled 2020-04-10 (×2): qty 40

## 2020-04-10 MED ORDER — ACETAMINOPHEN 325 MG PO TABS: 325.0000 mg | ORAL_TABLET | ORAL | Status: DC | PRN

## 2020-04-10 MED ORDER — CHLORHEXIDINE GLUCONATE 0.12 % MT SOLN
15.0000 mL | Freq: Once | OROMUCOSAL | Status: AC
  Administered 2020-04-10: 15 mL via OROMUCOSAL

## 2020-04-10 MED ORDER — LIDOCAINE HCL (PF) 2 % IJ SOLN
INTRAMUSCULAR | Status: AC
  Filled 2020-04-10: qty 10

## 2020-04-10 MED ORDER — KETOROLAC TROMETHAMINE 15 MG/ML IJ SOLN
INTRAMUSCULAR | Status: AC
  Filled 2020-04-10: qty 1

## 2020-04-10 MED ORDER — SUGAMMADEX SODIUM 200 MG/2ML IV SOLN
INTRAVENOUS | Status: DC | PRN
  Administered 2020-04-10: 200 mg via INTRAVENOUS

## 2020-04-10 MED ORDER — PROPOFOL 10 MG/ML IV BOLUS
INTRAVENOUS | Status: AC
  Filled 2020-04-10: qty 20

## 2020-04-10 MED ORDER — ACETAMINOPHEN 160 MG/5ML PO SOLN: 325.0000 mg | ORAL | Status: DC | PRN

## 2020-04-10 MED ORDER — FENTANYL CITRATE (PF) 100 MCG/2ML IJ SOLN
INTRAMUSCULAR | Status: DC | PRN
  Administered 2020-04-10: 50 ug via INTRAVENOUS
  Administered 2020-04-10: 100 ug via INTRAVENOUS
  Administered 2020-04-10: 50 ug via INTRAVENOUS

## 2020-04-10 MED ORDER — BUPIVACAINE LIPOSOME 1.3 % IJ SUSP
INTRAMUSCULAR | Status: DC | PRN
  Administered 2020-04-10: 20 mL

## 2020-04-10 MED ORDER — KETOROLAC TROMETHAMINE 15 MG/ML IJ SOLN
15.0000 mg | Freq: Once | INTRAMUSCULAR | Status: AC
  Administered 2020-04-10: 15 mg via INTRAVENOUS

## 2020-04-10 MED ORDER — ONDANSETRON HCL 4 MG/2ML IJ SOLN: 4.0000 mg | Freq: Once | INTRAMUSCULAR | Status: DC | PRN

## 2020-04-10 MED ORDER — BUPIVACAINE LIPOSOME 1.3 % IJ SUSP
20.0000 mL | Freq: Once | INTRAMUSCULAR | Status: DC
  Filled 2020-04-10: qty 20

## 2020-04-10 MED ORDER — ROCURONIUM BROMIDE 100 MG/10ML IV SOLN
INTRAVENOUS | Status: DC | PRN
  Administered 2020-04-10: 20 mg via INTRAVENOUS
  Administered 2020-04-10: 10 mg via INTRAVENOUS
  Administered 2020-04-10: 70 mg via INTRAVENOUS

## 2020-04-10 MED ORDER — SCOPOLAMINE 1 MG/3DAYS TD PT72
1.0000 | MEDICATED_PATCH | TRANSDERMAL | Status: DC
  Administered 2020-04-10: 1.5 mg via TRANSDERMAL
  Filled 2020-04-10: qty 1

## 2020-04-10 SURGICAL SUPPLY — 49 items
ADH SKN CLS APL DERMABOND .7 (GAUZE/BANDAGES/DRESSINGS) ×1
APPLIER CLIP ROT 10 11.4 M/L (STAPLE)
APR CLP MED LRG 11.4X10 (STAPLE)
CABLE HIGH FREQUENCY MONO STRZ (ELECTRODE) IMPLANT
CLAMP ENDO BABCK 10MM (STAPLE) IMPLANT
CLIP APPLIE ROT 10 11.4 M/L (STAPLE) IMPLANT
COVER SURGICAL LIGHT HANDLE (MISCELLANEOUS) ×2 IMPLANT
COVER WAND RF STERILE (DRAPES) IMPLANT
DECANTER SPIKE VIAL GLASS SM (MISCELLANEOUS) ×2 IMPLANT
DERMABOND ADVANCED (GAUZE/BANDAGES/DRESSINGS) ×1
DERMABOND ADVANCED .7 DNX12 (GAUZE/BANDAGES/DRESSINGS) ×1 IMPLANT
DEVICE SUT QUICK LOAD TK 5 (STAPLE) IMPLANT
DEVICE SUT TI-KNOT TK 5X26 (MISCELLANEOUS) IMPLANT
DEVICE SUTURE ENDOST 10MM (ENDOMECHANICALS) ×2 IMPLANT
DISSECTOR BLUNT TIP ENDO 5MM (MISCELLANEOUS) ×2 IMPLANT
DRAIN PENROSE 0.5X18 (DRAIN) ×2 IMPLANT
ELECT L-HOOK LAP 45CM DISP (ELECTROSURGICAL)
ELECT REM PT RETURN 15FT ADLT (MISCELLANEOUS) ×2 IMPLANT
ELECTRODE L-HOOK LAP 45CM DISP (ELECTROSURGICAL) IMPLANT
FELT TEFLON 4 X1 (Mesh General) ×1 IMPLANT
GLOVE BIOGEL M 8.0 STRL (GLOVE) ×2 IMPLANT
GOWN STRL REUS W/TWL XL LVL3 (GOWN DISPOSABLE) ×6 IMPLANT
GRASPER ENDO BABCOCK 10 (MISCELLANEOUS) IMPLANT
GRASPER ENDO BABCOCK 10MM (MISCELLANEOUS)
KIT BASIN OR (CUSTOM PROCEDURE TRAY) ×2 IMPLANT
KIT TURNOVER KIT A (KITS) IMPLANT
PENCIL SMOKE EVACUATOR (MISCELLANEOUS) IMPLANT
PROTECTOR NERVE ULNAR (MISCELLANEOUS) IMPLANT
SCISSORS LAP 5X45 EPIX DISP (ENDOMECHANICALS) ×2 IMPLANT
SET IRRIG TUBING LAPAROSCOPIC (IRRIGATION / IRRIGATOR) ×2 IMPLANT
SET TUBE SMOKE EVAC HIGH FLOW (TUBING) ×2 IMPLANT
SHEARS HARMONIC ACE PLUS 45CM (MISCELLANEOUS) ×2 IMPLANT
SLEEVE ADV FIXATION 5X100MM (TROCAR) ×4 IMPLANT
STAPLER VISISTAT 35W (STAPLE) ×2 IMPLANT
SUT MNCRL AB 4-0 PS2 18 (SUTURE) IMPLANT
SUT SURGIDAC NAB ES-9 0 48 120 (SUTURE) ×6 IMPLANT
SUT VIC AB 4-0 SH 18 (SUTURE) IMPLANT
TAPE CLOTH 4X10 WHT NS (GAUZE/BANDAGES/DRESSINGS) IMPLANT
TIP INNERVISION DETACH 40FR (MISCELLANEOUS) IMPLANT
TIP INNERVISION DETACH 50FR (MISCELLANEOUS) IMPLANT
TIP INNERVISION DETACH 56FR (MISCELLANEOUS) IMPLANT
TIPS INNERVISION DETACH 40FR (MISCELLANEOUS)
TOWEL OR 17X26 10 PK STRL BLUE (TOWEL DISPOSABLE) ×2 IMPLANT
TOWEL OR NON WOVEN STRL DISP B (DISPOSABLE) ×2 IMPLANT
TRAY FOLEY MTR SLVR 16FR STAT (SET/KITS/TRAYS/PACK) IMPLANT
TRAY LAPAROSCOPIC (CUSTOM PROCEDURE TRAY) ×2 IMPLANT
TROCAR ADV FIXATION 11X100MM (TROCAR) ×2 IMPLANT
TROCAR ADV FIXATION 5X100MM (TROCAR) ×2 IMPLANT
TROCAR BLADELESS OPT 5 100 (ENDOMECHANICALS) ×2 IMPLANT

## 2020-04-10 NOTE — Op Note (Signed)
SHANNAH CONTEH  08/26/1945   04/10/2020    PCP:  Melida Quitter, MD   Surgeon: Wenda Low, MD, FACS  Asst:  Ivar Drape, MD  Anes:  general  Preop Dx: Type III mixed hiatal hernia with vagal symptoms Postop Dx: same  Procedure: Lap repair of hiatal hernia with 4 suture post and Nissen over a 56 Fr bougie Location Surgery: [facility] Complications: None  EBL:   Minimal cc  Drains: None  Description of Procedure:  The patient was taken to OR 1.  After anesthesia was administered and the patient was prepped  with chlorhexidine and a timeout was performed.  Access to the abdomen was achieved with a 5 mm Optiview through the left upper quadrant.  A total of 6 ports were used including a 12 just to the right of the umbilicus.  The Nathanson retractor was installed.  The patient had a very large hiatal hernia with about 75% of her stomach in her chest.  We began on the right side and took down the peritoneal reflection at the right crus and carried this anteriorly.  We then went and dissected the sac out at that time before going over the left side and similarly incising the reflection and then dissecting the sac out to where we had all down onto the distal esophagus and proximal stomach.  At that point we saw the right and left crus nicely.  I resected the excess sac with the harmonic scalpel.  Hemostasis was present.  The crura were then approximated with 4 simple sutures secured with tie knots.  The first 1 most posteriorly had a pledget that I used on both sides.  Following this we felt like was a secure closure I went ahead and performed a Nissen fundoplication over a #56 French bougie which was passed by anesthesia.  The wrap was performed with 3 sutures getting purchases of stomach esophagus stomach and securing with a tie knot.  When completed all ports were examined and the abdomen was examined everything appeared to be in order.  I had previously done a tap block with 30 cc of  Exparel on both sides.  The abdomen was deflated and the wounds were closed with 4-0 Monocryl and Dermabond.  The patient tolerated the procedure well and was taken to the PACU in stable condition.     Matt B. Daphine Deutscher, MD, [facility], [place] [phone]

## 2020-04-10 NOTE — Interval H&P Note (Signed)
History and Physical Interval Note:  04/10/2020 10:39 AM  Cindy [name]  has presented today for surgery, with the diagnosis of LARGE TYPE 3 HIATAL HERNIA.  The various methods of treatment have been discussed with the patient and family. After consideration of risks, benefits and other options for treatment, the patient has consented to  Procedure(s): LAPAROSCOPIC REPAIR OF TYPE 3 HIATAL HERNIA (N/A) as a surgical intervention.  The patient's history has been reviewed, patient examined, no change in status, stable for surgery.  I have reviewed the patient's chart and labs.  Questions were answered to the patient's satisfaction.     Valarie Merino

## 2020-04-10 NOTE — Anesthesia Postprocedure Evaluation (Signed)
Anesthesia Post Note  Patient: Cindy [name]  Procedure(s) Performed: LAPAROSCOPIC REPAIR OF TYPE 3 HIATAL HERNIA WITH NISSEN FUNDOPLICATION (N/A Abdomen)     Patient location during evaluation: PACU Anesthesia Type: General Level of consciousness: awake and sedated Pain management: pain level controlled Vital Signs Assessment: post-procedure vital signs reviewed and stable Respiratory status: spontaneous breathing Cardiovascular status: stable Postop Assessment: no apparent nausea or vomiting Anesthetic complications: no   No complications documented.  Last Vitals:  Vitals:   04/10/20 1527 04/10/20 1632  BP: 119/61 114/61  Pulse: 67 60  Resp: 14 16  Temp: (!) 36.4 C 36.6 C  SpO2: 96% 96%    Last Pain:  Vitals:   04/10/20 1527  TempSrc: Oral  PainSc:                  Caren Macadam

## 2020-04-10 NOTE — Transfer of Care (Signed)
Immediate Anesthesia Transfer of Care Note  Patient: Cindy [name]  Procedure(s) Performed: LAPAROSCOPIC REPAIR OF TYPE 3 HIATAL HERNIA (N/A )  Patient Location: PACU  Anesthesia Type:General  Level of Consciousness: awake, alert  and oriented  Airway & Oxygen Therapy: Patient Spontanous Breathing and Patient connected to face mask  Post-op Assessment: Report given to RN and Post -op Vital signs reviewed and stable  Post vital signs: Reviewed and stable  Last Vitals:  Vitals Value Taken Time  BP    Temp    Pulse 61 04/10/20 1308  Resp 14 04/10/20 1308  SpO2 96 % 04/10/20 1308  Vitals shown include unvalidated device data.  Last Pain:  Vitals:   04/10/20 0939  TempSrc:   PainSc: 1       Patients Stated Pain Goal: 1 (04/10/20 0939)  Complications: No complications documented.

## 2020-04-10 NOTE — Anesthesia Procedure Notes (Signed)
Procedure Name: Intubation Performed by: Sudie Grumbling, CRNA Pre-anesthesia Checklist: Patient identified, Emergency Drugs available, Suction available and Patient being monitored Patient Re-evaluated:Patient Re-evaluated prior to induction Oxygen Delivery Method: Circle system utilized Preoxygenation: Pre-oxygenation with 100% oxygen Induction Type: IV induction Ventilation: Mask ventilation without difficulty Laryngoscope Size: Miller and 2 Grade View: Grade I Tube type: Oral Number of attempts: 1 Airway Equipment and Method: Stylet Placement Confirmation: ETT inserted through vocal cords under direct vision,  positive ETCO2 and breath sounds checked- equal and bilateral Secured at: 23 cm Tube secured with: Tape Dental Injury: Teeth and Oropharynx as per pre-operative assessment

## 2020-04-11 ENCOUNTER — Encounter (HOSPITAL_COMMUNITY): Payer: Self-pay | Admitting: Surgery

## 2020-04-11 LAB — BASIC METABOLIC PANEL
Anion gap: 10 (ref 5–15)
BUN: 27 mg/dL — ABNORMAL HIGH (ref 8–23)
CO2: 24 mmol/L (ref 22–32)
Calcium: 8.6 mg/dL — ABNORMAL LOW (ref 8.9–10.3)
Chloride: 102 mmol/L (ref 98–111)
Creatinine, Ser: 1.16 mg/dL — ABNORMAL HIGH (ref 0.44–1.00)
GFR, Estimated: 49 mL/min — ABNORMAL LOW (ref >60)
Glucose, Bld: 122 mg/dL — ABNORMAL HIGH (ref 70–99)
Potassium: 4.4 mmol/L (ref 3.5–5.1)
Sodium: 136 mmol/L (ref 135–145)

## 2020-04-11 LAB — CBC
HCT: 38.2 % (ref 36.0–46.0)
Hemoglobin: 12.2 g/dL (ref 12.0–15.0)
MCH: 30.2 pg (ref 26.0–34.0)
MCHC: 31.9 g/dL (ref 30.0–36.0)
MCV: 94.6 fL (ref 80.0–100.0)
Platelets: 219 10*3/uL (ref 150–400)
RBC: 4.04 MIL/uL (ref 3.87–5.11)
RDW: 13.2 % (ref 11.5–15.5)
WBC: 7.5 10*3/uL (ref 4.0–10.5)
nRBC: 0 % (ref 0.0–0.2)

## 2020-04-11 MED ORDER — NEBIVOLOL HCL 5 MG PO TABS
5.0000 mg | ORAL_TABLET | Freq: Every day | ORAL | Status: DC
  Administered 2020-04-12 (×2): 5 mg via ORAL
  Filled 2020-04-11 (×2): qty 1

## 2020-04-11 MED ORDER — LEVOTHYROXINE SODIUM 125 MCG PO TABS
125.0000 ug | ORAL_TABLET | Freq: Every day | ORAL | Status: DC
  Administered 2020-04-11: 125 ug via ORAL
  Filled 2020-04-11: qty 1

## 2020-04-11 MED ORDER — ACETAMINOPHEN 325 MG PO TABS
650.0000 mg | ORAL_TABLET | Freq: Four times a day (QID) | ORAL | Status: DC | PRN
  Administered 2020-04-11 – 2020-04-12 (×2): 650 mg via ORAL
  Filled 2020-04-11 (×3): qty 2

## 2020-04-11 MED ORDER — AMLODIPINE BESYLATE 5 MG PO TABS
5.0000 mg | ORAL_TABLET | Freq: Every day | ORAL | Status: DC
  Administered 2020-04-11 – 2020-04-12 (×2): 5 mg via ORAL
  Filled 2020-04-11 (×2): qty 1

## 2020-04-11 MED ORDER — SPIRONOLACTONE 25 MG PO TABS
50.0000 mg | ORAL_TABLET | Freq: Every day | ORAL | Status: DC
  Administered 2020-04-12 (×2): 50 mg via ORAL
  Filled 2020-04-11 (×2): qty 2

## 2020-04-11 NOTE — Progress Notes (Signed)
Patient ID: Cindy [name], female   DOB: 04/10/1945, 75 y.o.   MRN: [id number] [facility] Progress Note:   1 Day Post-Op  Subjective: Mental status is clear.  Complaints sore.  Has had some dizziness preop. Objective: Vital signs in last 24 hours: Temp:  [96.9 F (36.1 C)-98.8 F (37.1 C)] 98.6 F (37 C) (01/05 0552) Pulse Rate:  [53-89] 53 (01/05 0552) Resp:  [10-19] 16 (01/05 0552) BP: (105-165)/(53-133) 150/68 (01/05 0552) SpO2:  [93 %-100 %] 99 % (01/05 0552) Weight:  [73.9 kg] 73.9 kg (01/04 0927)  Intake/Output from previous day: 01/04 0701 - 01/05 0700 In: 4355.7 [P.O.:480; I.V.:3875.7] Out: 130 [Urine:80; Blood:50] Intake/Output this shift: No intake/output data recorded.  Physical Exam: Work of breathing is normal.  Trocar sites are appropriately sore.    Lab Results:  Results for orders placed or performed during the hospital encounter of 04/10/20 (from the past 48 hour(s))  CBC     Status: Abnormal   Collection Time: 04/10/20  1:49 PM  Result Value Ref Range   WBC 11.8 (H) 4.0 - 10.5 K/uL   RBC 4.70 3.87 - 5.11 MIL/uL   Hemoglobin 14.6 12.0 - 15.0 g/dL   HCT 44.7 36.0 - 46.0 %   MCV 95.1 80.0 - 100.0 fL   MCH 31.1 26.0 - 34.0 pg   MCHC 32.7 30.0 - 36.0 g/dL   RDW 13.1 11.5 - 15.5 %   Platelets 237 150 - 400 K/uL   nRBC 0.0 0.0 - 0.2 %    Comment: Performed at [facility], [place] [address], [place], [place] [address]  Creatinine, serum     Status: Abnormal   Collection Time: 04/10/20  1:49 PM  Result Value Ref Range   Creatinine, Ser 1.14 (H) 0.44 - 1.00 mg/dL   GFR, Estimated 51 (L) >60 mL/min    Comment: (NOTE) Calculated using the CKD-EPI Creatinine Equation (2021) Performed at [facility], [place] [address], [place], [place] [id number]   Basic metabolic panel     Status: Abnormal   Collection Time: 04/11/20  7:45 AM  Result Value Ref Range   Sodium 136 135 - 145 mmol/L   Potassium 4.4 3.5 - 5.1  mmol/L   Chloride 102 98 - 111 mmol/L   CO2 24 22 - 32 mmol/L   Glucose, Bld 122 (H) 70 - 99 mg/dL    Comment: Glucose reference range applies only to samples taken after fasting for at least 8 hours.   BUN 27 (H) 8 - 23 mg/dL   Creatinine, Ser 1.16 (H) 0.44 - 1.00 mg/dL   Calcium 8.6 (L) 8.9 - 10.3 mg/dL   GFR, Estimated 49 (L) >60 mL/min    Comment: (NOTE) Calculated using the CKD-EPI Creatinine Equation (2021)    Anion gap 10 5 - 15    Comment: Performed at [facility], [place] [address], [place], [place] [address]  CBC     Status: None   Collection Time: 04/11/20  7:45 AM  Result Value Ref Range   WBC 7.5 4.0 - 10.5 K/uL   RBC 4.04 3.87 - 5.11 MIL/uL   Hemoglobin 12.2 12.0 - 15.0 g/dL   HCT 38.2 36.0 - 46.0 %   MCV 94.6 80.0 - 100.0 fL   MCH 30.2 26.0 - 34.0 pg   MCHC 31.9 30.0 - 36.0 g/dL   RDW 13.2 11.5 - 15.5 %   Platelets 219 150 - 400 K/uL   nRBC  0.0 0.0 - 0.2 %    Comment: Performed at [facility], [address], [place], [place] [address]    Radiology/Results: No results found.  Anti-infectives: Anti-infectives (From admission, onward)   Start     Dose/Rate Route Frequency Ordered Stop   04/10/20 0930  ceFAZolin (ANCEF) IVPB 2g/100 mL premix        2 g 200 mL/hr over 30 Minutes Intravenous On call to O.R. 04/10/20 0923 04/10/20 1116      Assessment/Plan: Problem List: Patient Active Problem List   Diagnosis Date Noted  . Status post laparoscopic Nissen fundoplication 04/10/2020  . Atrial tachycardia (HCC) 06/15/2019  . Status post total shoulder arthroplasty, left 01/27/2019  . 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 Anemia, iron deficiency 05/07/2015  . 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 Osteopenia 05/07/2015  . Neurocardiogenic syncope 05/07/2015  . Unable to lose weight 04/24/2014  . Lumbar radiculopathy, chronic 01/03/2014  . Abnormal nuclear stress test 12/15/2013  . Hyperthyroidism 10/06/2011  . Syncope 08/20/2010  .  Pacemaker -Biotronik-CLS 08/20/2010  . Essential hypertension 04/15/2010  . PREMATURE VENTRICULAR CONTRACTIONS 04/15/2010  . GERD 04/15/2010  . Postablative hypothyroidism     Will advance from clear liquids to full liquids today.  Control nausea and hopeful discharge tomorrow.   1 Day Post-Op    LOS: 1 day   Matt B. Daphine Deutscher, MD, [facility] [phone] to reach the surgeon on call.    04/11/2020 8:48 AM

## 2020-04-11 NOTE — Progress Notes (Signed)
Pt requesting home medications. Pt stated that she did not receive her home medications for BP 04/10/20 PM.  BP is 165/65. Gross, MD notified and verbal order placed

## 2020-04-12 MED ORDER — HYDROCODONE-ACETAMINOPHEN 5-325 MG PO TABS: 1.0000 | ORAL_TABLET | Freq: Four times a day (QID) | ORAL | 0 refills | Status: DC | PRN

## 2020-04-12 MED ORDER — ONDANSETRON 4 MG PO TBDP: 4.0000 mg | ORAL_TABLET | Freq: Four times a day (QID) | ORAL | 0 refills | Status: DC | PRN

## 2020-04-12 NOTE — Discharge Instructions (Signed)
Eating Plan After Nissen Fundoplication After a Nissen fundoplication procedure, it is common to have some difficulty swallowing. The part of your body that moves food and liquid from your mouth to your stomach (esophagus) will be swollen and may feel tight. It will take several weeks or months for your esophagus and stomach to heal. By following a special eating plan, you can prevent problems such as pain, swelling or pressure in the abdomen (bloating), gas, nausea, or diarrhea. What are tips for following this plan? Cooking  Cook all foods until they are soft.  Remove skins and seeds from fruits and vegetables before eating.  Remove skin and gristle from meats before eating.  Grind or finely mince meats before eating.  Avoid over-cooking meat. Dry, tough meat is more difficult to swallow.  Avoid or use small amounts of oil when cooking.  Avoid or use small amounts of seasoning when cooking.  Toast bread before eating. This makes it easier to swallow.  Allow hot soups and drinks to cool before eating. Meal planning   Eat 6-8 small meals throughout the day.  Right after the surgery, have a few meals that are only clear liquids. Clear liquids include: ? Water. ? Clear fruit juice (no pulp). ? Chicken, beef, or vegetable broth. ? Gelatin. ? Decaffeinated tea or coffee without milk. ? Ice pops or shaved ice.  Depending on your progress, you may move to a full liquid diet as told by your health care provider. This includes clear liquids and the following: ? Dairy and alternative milks (soy). ? Strained creamed soups. ? Ice cream or sherbet. ? Pudding. ? Nutritional supplement drinks. ? Yogurt.  A few days after surgery, you may be able to start eating a diet of soft foods. You may need to eat according to this plan for several weeks.  Avoid foods and drinks that contain caffeine and chocolate.  Do not drink carbonated drinks or alcohol.  Avoid foods and drinks that  contain citrus or tomato.  Do not eat sweets or sweetened drinks at the beginning of a meal. Doing that may cause your stomach to empty faster than it should (dumping syndrome).  Avoid foods that cause gas, such as beans, peas, broccoli, or cabbage.  If dairy milk products cause diarrhea, avoid them or eat them in small amounts. Lifestyle   Always sit upright when eating or drinking.  Eat slowly. Take small bites and chew food well before swallowing.  Do not lie down after eating. Stay sitting up for 30 minutes or longer after each meal.  Sip fluids between meals.  Do not mix solid foods and liquids in the same mouthful.  Limit how much you drink at one time. With meals and snacks, have 4-8 oz (120-240 mL). This is equal to  cup-1 cup.  Drink enough fluid to keep your urine pale yellow.  Do not chew gum or drink fluids through a straw. Doing those things may cause you to swallow extra air. Recommended foods The items listed may not be a complete list. Talk with your dietitian about what dietary choices are best for you. Grains  Cooked cereals. Dry cereals softened with liquid. Cooked pasta, rice, or other grains. Toasted bread. Bland crackers, such as soda or graham crackers. Vegetables  Any soft-cooked vegetables after skins and seeds are removed. Vegetable juice. Fruits  Any soft-cooked fruits after skins and seeds are removed. Fruit juice. Meats and other protein foods  Tender cuts of meat, poultry, or fish after  bones, skin, and gristle are removed. Poached, boiled, or scrambled eggs. Canned fish. Tofu. Creamy nut butters. Dairy  Milk. Yogurt. Cottage cheese. Mild cheeses. Beverages  Nutritional supplement drinks. Decaffeinated tea or coffee. Sports drinks. Fats and oils  Butter. Margarine. Mayonnaise. Vegetable oil. Smooth salad dressing. Sweets and desserts  Plain hard candy. Marshmallows. Pudding. Ice cream. Gelatin. Sherbet. Seasoning and other  foods  Salt. Light seasonings. Mustard. Vinegar. Foods to avoid The items listed may not be a complete list. Talk with your dietitian about what dietary choices are best for you. Grains  High-fiber or bran cereal. Cereal with nuts, dried fruit, or coconut. Sweet breads, rolls, coffee cake, or donuts. Chewy or crusty breads. Popcorn. Vegetables  Tomato sauce. Tomato juice. Broccoli. Cauliflower. Cabbage. Brussels sprouts. Crunchy, raw vegetables. Fruits  Oranges. Grapefruit. Lemons. Limes. Citrus juices. Dried fruit. Crunchy, raw fruits. Meats and other protein foods  Beans, peas, and lentils. Tough or fatty meats. Fried meats, chicken, or fish. Fried eggs. Nuts and seeds. Crunchy nut butters. Dairy  Chocolate milk. Yogurt with chunks of fruit, nuts, seeds, or coconut. Strong cheeses. Beverages  Carbonated soft drinks. Alcohol. Cocoa. Hot drinks. Fats and oils  Bacon fat. Lard. Sweets and desserts  Chocolate. Candy with nuts, coconut, or seeds. Peppermint. Cookies. Cakes. Pie crust. Seasoning and other foods  Heavy seasonings. Chili sauce. Ketchup. Barbecue sauce. Pickles. Horseradish. Summary  Following this eating plan after a Nissen fundoplication is an important part of healing after surgery.  After surgery, you will start with a clear liquid diet before you progress to full liquids and soft foods. You may need to eat soft foods for several weeks.  Avoid eating foods that cause irritation, gas, nausea, diarrhea, or swelling or pressure in the abdomen (bloating), and avoid foods that are difficult to swallow.  Talk with your diet and nutrition specialist (dietitian) about what dietary choices are best for you. This information is not intended to replace advice given to you by your health care provider. Make sure you discuss any questions you have with your health care provider. Document Revised: 03/06/2017 Document Reviewed: 10/06/2016 Elsevier Patient Education  2020  Elsevier Inc.  

## 2020-04-12 NOTE — Progress Notes (Signed)
D/C instructions given to patient. Patient had no questions. NT or writer will wheel patient out once her ride is here

## 2020-04-12 NOTE — Discharge Summary (Signed)
Physician Discharge Summary  Patient ID: Cindy [name] MRN: [id number] DOB/AGE: Dec 11, 1945 75 y.o.  PCP: Melida Quitter, MD  Admit date: 04/10/2020 Discharge date: 04/12/2020  Admission Diagnoses:  Hiatal hernia with GERD  Discharge Diagnoses:  same  Active Problems:   Status post laparoscopic Nissen fundoplication   Surgery:  Laparoscopic repair of hiatal hernia with Nissen fundoplication  Discharged Condition: improved  Hospital Course:   Had surgery.  Diet advanced more slowly.  Ready for discharge on PD 2.   Consults: none  Significant Diagnostic Studies: none    Discharge Exam: Blood pressure 128/90, pulse 60, temperature 99.5 F (37.5 C), temperature source Oral, resp. rate 18, height 5\' 2"  (1.575 m), weight 73.9 kg, SpO2 95 %. Incisions OK  Disposition: Discharge disposition: 01-Home or Self Care       Discharge Instructions    Call MD for:  redness, tenderness, or signs of infection (pain, swelling, redness, odor or green/yellow discharge around incision site)   Complete by: As directed    Diet - low sodium heart healthy   Complete by: As directed    Increase activity slowly   Complete by: As directed      Allergies as of 04/12/2020      Reactions   Ace Inhibitors Swelling   Lisinopril Swelling   angiodema   Codeine Nausea And Vomiting      Medication List    TAKE these medications   acetaminophen 650 MG CR tablet Commonly known as: TYLENOL Take 1,300 mg by mouth every 8 (eight) hours as needed for pain.   ALPRAZolam 0.5 MG tablet Commonly known as: XANAX Take 0.25 mg by mouth 2 (two) times daily as needed for anxiety.   amLODipine 5 MG tablet Commonly known as: NORVASC Take 1 tablet (5 mg total) by mouth daily.   EQ MULTIVITAMINS ADULT GUMMY PO daily in the afternoon.   HYDROcodone-acetaminophen 5-325 MG tablet Commonly known as: NORCO/VICODIN Take 1 tablet by mouth every 6 (six) hours as needed for moderate pain.   levothyroxine  125 MCG tablet Commonly known as: SYNTHROID Take 125 mcg by mouth daily before breakfast.   nebivolol 5 MG tablet Commonly known as: BYSTOLIC Take 5 mg by mouth daily.   ondansetron 4 MG disintegrating tablet Commonly known as: ZOFRAN-ODT Take 1 tablet (4 mg total) by mouth every 6 (six) hours as needed for nausea.   pantoprazole 40 MG tablet Commonly known as: PROTONIX Take 40 mg by mouth daily.   spironolactone 50 MG tablet Commonly known as: ALDACTONE Take 50 mg by mouth daily.   Vitamin D3 10 MCG (400 UNIT) tablet 800 mg daily in the afternoon.       Follow-up Information    06/10/2020, MD. Schedule an appointment as soon as possible for a visit in 3 week(s).   Specialty: General Surgery Contact information: [address] [place] [phone]               Signed: [phone] 04/12/2020, 8:35 AM

## 2020-05-03 ENCOUNTER — Telehealth: Payer: Self-pay | Admitting: Internal Medicine

## 2020-05-03 ENCOUNTER — Ambulatory Visit (INDEPENDENT_AMBULATORY_CARE_PROVIDER_SITE_OTHER): Payer: PPO | Admitting: Emergency Medicine

## 2020-05-03 ENCOUNTER — Other Ambulatory Visit: Payer: Self-pay

## 2020-05-03 ENCOUNTER — Telehealth: Payer: Self-pay

## 2020-05-03 DIAGNOSIS — I442 Atrioventricular block, complete: Secondary | ICD-10-CM | POA: Diagnosis not present

## 2020-05-03 DIAGNOSIS — H9313 Tinnitus, bilateral: Secondary | ICD-10-CM | POA: Diagnosis not present

## 2020-05-03 DIAGNOSIS — H6523 Chronic serous otitis media, bilateral: Secondary | ICD-10-CM | POA: Diagnosis not present

## 2020-05-03 DIAGNOSIS — N1831 Chronic kidney disease, stage 3a: Secondary | ICD-10-CM | POA: Diagnosis not present

## 2020-05-03 DIAGNOSIS — E785 Hyperlipidemia, unspecified: Secondary | ICD-10-CM | POA: Diagnosis not present

## 2020-05-03 DIAGNOSIS — R0982 Postnasal drip: Secondary | ICD-10-CM | POA: Diagnosis not present

## 2020-05-03 DIAGNOSIS — H8110 Benign paroxysmal vertigo, unspecified ear: Secondary | ICD-10-CM | POA: Diagnosis not present

## 2020-05-03 DIAGNOSIS — Z950 Presence of cardiac pacemaker: Secondary | ICD-10-CM | POA: Diagnosis not present

## 2020-05-03 DIAGNOSIS — E890 Postprocedural hypothyroidism: Secondary | ICD-10-CM | POA: Diagnosis not present

## 2020-05-03 DIAGNOSIS — I129 Hypertensive chronic kidney disease with stage 1 through stage 4 chronic kidney disease, or unspecified chronic kidney disease: Secondary | ICD-10-CM | POA: Diagnosis not present

## 2020-05-03 LAB — CUP PACEART INCLINIC DEVICE CHECK
Date Time Interrogation Session: 20220127141645
Implantable Lead Implant Date: 20120606
Implantable Lead Implant Date: 20120606
Implantable Lead Location: 753859
Implantable Lead Location: 753860
Implantable Lead Model: 5076
Implantable Lead Model: 5076
Implantable Pulse Generator Implant Date: 20120606
Lead Channel Impedance Value: 390 Ohm
Lead Channel Impedance Value: 468 Ohm
Lead Channel Pacing Threshold Amplitude: 0.7 V
Lead Channel Pacing Threshold Amplitude: 1.3 V
Lead Channel Pacing Threshold Pulse Width: 0.4 ms
Lead Channel Pacing Threshold Pulse Width: 0.4 ms
Lead Channel Sensing Intrinsic Amplitude: 6.5 mV
Lead Channel Sensing Intrinsic Amplitude: 6.5 mV
Lead Channel Setting Pacing Amplitude: 2 V
Lead Channel Setting Pacing Amplitude: 2.4 V
Lead Channel Setting Pacing Pulse Width: 0.4 ms
Pulse Gen Serial Number: 66089338

## 2020-05-03 NOTE — Telephone Encounter (Signed)
STAT if patient feels like he/she is going to faint   Are you dizzy now? Off a little right now, but not completely dizzy- had an episode yesterday and this morning. She said she  was standing and felt like she was spinning around and around Do you feel faint or have you passed out? no  1) Do you have any other symptoms? no  2) Have you checked your HR and BP (record if available)?  150/77- pt said her primary doctor said to get appt today if possible

## 2020-05-03 NOTE — Patient Instructions (Signed)
Please call if you have further questions or concerns

## 2020-05-03 NOTE — Progress Notes (Signed)
Pacemaker check in clinic. Normal device function. Thresholds, sensing, impedances consistent with previous measurements. Device programmed to maximize longevity. 66 AMS events logged w/ 20 EGM's available. Appear AT w/ controlled rates, all <8 seconds. No since 04/22/20 prior to recent symptoms. No high ventricular rates noted. Device programmed at appropriate safety margins. Histogram distribution appropriate for patient activity level. Estimated longevity 4 years 7 months. Patient only does in-clinic checks. Patient education completed.  Patient denies any symptoms at this time. States she is going to her PCP at 3:00 PM for evaluation. She is not driving. Advised patient to call with further questions or concerns if they arise.

## 2020-05-03 NOTE — Telephone Encounter (Signed)
Spoke with pt and advised device appointment available today at 2pm for pacemaker interrogation d/t pt's episodes of dizziness.  Pt agrees with appointment date and time and thanked Therapist, sports for call.

## 2020-05-03 NOTE — Telephone Encounter (Signed)
Spoke with pt who reports 2 episodes of dizziness yesterday and today.  Pt reports episodes lasted only a few seconds and pt felt as if she was spinning.  Denies nausea or vomiting.  Pt states she does not feel this is cardiac and has contacted her PCP but PCP request that she contact cardiology first.  Pt's pacemaker is unable to send a remote transmission for review.  Pt advised no current appointments available.  Pt states she will contact her PCP office again and request to be seen today since she does not feel this is any way heart related.  Pt advised if PCP determines she does need to be seen by cardiology to contact office again for scheduling.  Reviewed ED precautions.  Pt verbalizes understanding and thanked Therapist, sports for call.

## 2020-05-14 ENCOUNTER — Other Ambulatory Visit: Payer: PPO

## 2020-05-14 DIAGNOSIS — Z20822 Contact with and (suspected) exposure to covid-19: Secondary | ICD-10-CM | POA: Diagnosis not present

## 2020-06-26 DIAGNOSIS — R7301 Impaired fasting glucose: Secondary | ICD-10-CM | POA: Diagnosis not present

## 2020-06-26 DIAGNOSIS — E890 Postprocedural hypothyroidism: Secondary | ICD-10-CM | POA: Diagnosis not present

## 2020-07-02 DIAGNOSIS — R7301 Impaired fasting glucose: Secondary | ICD-10-CM | POA: Diagnosis not present

## 2020-07-02 DIAGNOSIS — E890 Postprocedural hypothyroidism: Secondary | ICD-10-CM | POA: Diagnosis not present

## 2020-07-02 DIAGNOSIS — E785 Hyperlipidemia, unspecified: Secondary | ICD-10-CM | POA: Diagnosis not present

## 2020-07-02 DIAGNOSIS — M792 Neuralgia and neuritis, unspecified: Secondary | ICD-10-CM | POA: Diagnosis not present

## 2020-07-02 DIAGNOSIS — I10 Essential (primary) hypertension: Secondary | ICD-10-CM | POA: Diagnosis not present

## 2020-07-02 DIAGNOSIS — N189 Chronic kidney disease, unspecified: Secondary | ICD-10-CM | POA: Diagnosis not present

## 2020-07-23 ENCOUNTER — Other Ambulatory Visit: Payer: Self-pay | Admitting: Internal Medicine

## 2020-08-08 DIAGNOSIS — R600 Localized edema: Secondary | ICD-10-CM | POA: Diagnosis not present

## 2020-08-08 DIAGNOSIS — M79673 Pain in unspecified foot: Secondary | ICD-10-CM | POA: Diagnosis not present

## 2020-08-08 DIAGNOSIS — I129 Hypertensive chronic kidney disease with stage 1 through stage 4 chronic kidney disease, or unspecified chronic kidney disease: Secondary | ICD-10-CM | POA: Diagnosis not present

## 2020-08-23 ENCOUNTER — Other Ambulatory Visit: Payer: Self-pay

## 2020-08-23 ENCOUNTER — Ambulatory Visit (INDEPENDENT_AMBULATORY_CARE_PROVIDER_SITE_OTHER): Payer: PPO | Admitting: Podiatry

## 2020-08-23 ENCOUNTER — Encounter: Payer: Self-pay | Admitting: Podiatry

## 2020-08-23 ENCOUNTER — Ambulatory Visit (INDEPENDENT_AMBULATORY_CARE_PROVIDER_SITE_OTHER): Payer: PPO

## 2020-08-23 DIAGNOSIS — M79671 Pain in right foot: Secondary | ICD-10-CM

## 2020-08-23 DIAGNOSIS — M778 Other enthesopathies, not elsewhere classified: Secondary | ICD-10-CM

## 2020-08-23 DIAGNOSIS — G629 Polyneuropathy, unspecified: Secondary | ICD-10-CM

## 2020-08-23 DIAGNOSIS — M79672 Pain in left foot: Secondary | ICD-10-CM | POA: Diagnosis not present

## 2020-08-23 MED ORDER — TRIAMCINOLONE ACETONIDE 10 MG/ML IJ SUSP
10.0000 mg | Freq: Once | INTRAMUSCULAR | Status: AC
  Administered 2020-08-23: 10 mg

## 2020-08-23 NOTE — Progress Notes (Signed)
Subjective:   Patient ID: Cindy [name], female   DOB: 75 y.o.   MRN: [id number]   HPI Patient states she has developed burning in both of her feet over the last 6 months to a year and they tried her on metformin to see if that will make a difference.  Also complaining of pain on top of the left over right foot with inflammation that she states is getting more discomforting for her and that its been present for several months   ROS      Objective:  Physical Exam  Vascular status intact mild diminishment sharp dull vibratory with patient showing symptoms of low-grade neuropathic-like symptomatology which may be due to compression with patient not having diabetes but currently on metformin as a possibility of her problems.  The dorsum of her left foot is quite sore with inflammation of the midtarsal joint over right foot     Assessment:  Extensor tendinitis left with probability of low-grade idiopathic neuropathic condition     Plan:  H&P reviewed both conditions separately.  For the neuropathy I have advised her on a vitamin complex that she will get discussed the possibility for gabapentin in the future if symptoms were to worsen and she will continue to follow with her physician.  I then did sterile prep and injected the dorsal tendon complex left 3 mg Dexasone Kenalog 5 mg Xylocaine advised on utilization of topical medicines and shoes that do not press and will be seen back as needed may require injection right foot  X-rays indicate there is some compression of the joints across the midtarsal joint bilateral with mild spur formation

## 2020-08-24 DIAGNOSIS — E785 Hyperlipidemia, unspecified: Secondary | ICD-10-CM | POA: Diagnosis not present

## 2020-08-24 DIAGNOSIS — E890 Postprocedural hypothyroidism: Secondary | ICD-10-CM | POA: Diagnosis not present

## 2020-08-24 DIAGNOSIS — R7301 Impaired fasting glucose: Secondary | ICD-10-CM | POA: Diagnosis not present

## 2020-09-05 DIAGNOSIS — E785 Hyperlipidemia, unspecified: Secondary | ICD-10-CM | POA: Diagnosis not present

## 2020-09-05 DIAGNOSIS — E890 Postprocedural hypothyroidism: Secondary | ICD-10-CM | POA: Diagnosis not present

## 2020-09-05 DIAGNOSIS — N189 Chronic kidney disease, unspecified: Secondary | ICD-10-CM | POA: Diagnosis not present

## 2020-09-05 DIAGNOSIS — I10 Essential (primary) hypertension: Secondary | ICD-10-CM | POA: Diagnosis not present

## 2020-09-05 DIAGNOSIS — R7301 Impaired fasting glucose: Secondary | ICD-10-CM | POA: Diagnosis not present

## 2020-09-05 DIAGNOSIS — G629 Polyneuropathy, unspecified: Secondary | ICD-10-CM | POA: Diagnosis not present

## 2020-09-20 NOTE — Progress Notes (Signed)
Patient ID: Cindy [name], female   DOB: 1945/11/29, 75 y.o.   MRN: [id number]      Patient Care Team: Michael Boston, MD as PCP - General (Internal Medicine) Deboraha Sprang, MD as PCP - Electrophysiology (Cardiology) Deboraha Sprang, MD (Cardiology) Jacelyn Pi, MD (Endocrinology) Juanita Craver, MD (Gastroenterology)   HPI  Cindy [name] is a 75 y.o. female Seen in follow-up for vasovagal syncope for which she underwent Biotronik CLS pacing 2012. .     The patient denies chest pain, shortness of breath, nocturnal dyspnea, orthopnea .  There have been no syncope. Complains of lightheadedness and occasional palpitation brief fluutter.  Some peripheral edema She notes she has had 1 lightheaded episode  but believe it was due to the start of her new medication resolved with its discontinuation9  She has been better but feeling some fatigue and been dealing with depression has tried antidepressants in the past without much benefit    Metformin 500 mg taken by PO Every Evening     DATE TEST EF   8/15 Myoview   73 % Anterior reversible defect   9/15 LHC   65 % Cors normal          Date Cr K Hgb  2/19 0.87 3.6 9.1.  (MCV 90)  6/19  3.1    12/19 0.84 3.8 13.8  10/20 0.85 3.3 12.1  1/21 (7/21) 0.97 (7/21) 3.9 14.5  3/22     5/22 0.88 4.3    Past Medical History:  Diagnosis Date   Abnormal nuclear stress test 12/15/2013   Anemia    hx iron def, no GI loss indentifed - resolved with diet change     Anxiety    Arthritis    Dysrhythmia    GRAVES' DISEASE 2013   I-131 ablation, now post tx hypothyroid state   HYPERTENSION    Impaired glucose tolerance test    Keloid 12/31/2010   incisional    Pacemaker -Biotronik-CLS 08/20/2010   Fainted easily   Postablative hypothyroidism    Qualifier: Diagnosis of  By: Joya Gaskins CMA, [place]     Pre-diabetes    Sciatica    Syncope 08/20/2010   s/p PPM 08/2010 for sxc pause on loop recorder causing same   Unspecified vitamin D  deficiency     Past Surgical History:  Procedure Laterality Date   APPENDECTOMY     blephroplasty     BREAST BIOPSY Left 2001   guided excisional bx (L) breast microcalcification at 12 o'clock- Benign   BUNIONECTOMY Left    CATARACT EXTRACTION  2014   HIATAL HERNIA REPAIR N/A 04/10/2020   Procedure: LAPAROSCOPIC REPAIR OF TYPE 3 HIATAL HERNIA WITH NISSEN FUNDOPLICATION;  Surgeon: Johnathan Hausen, MD;  Location: [facility];  Service: General;  Laterality: N/A;   LEFT HEART CATHETERIZATION WITH CORONARY ANGIOGRAM N/A 12/22/2013   Procedure: LEFT HEART CATHETERIZATION WITH CORONARY ANGIOGRAM;  Surgeon: Sinclair Grooms, MD;  Location[facility];  Service: Cardiovascular;  Laterality: N/A;   PACEMAKER INSERTION     Right ankle  06/2010   Trimalleolar fracture   TOTAL SHOULDER ARTHROPLASTY Left 01/27/2019   Procedure: TOTAL SHOULDER ARTHROPLASTY;  Surgeon: Tania Ade, MD;  Location: [facility];  Service: Orthopedics;  Laterality: Left;    Current Outpatient Medications  Medication Sig Dispense Refill   acetaminophen (TYLENOL) 650 MG CR tablet Take 1,300 mg by mouth every 8 (eight) hours as needed for pain.     ALPRAZolam Duanne Moron)  0.5 MG tablet Take 0.25 mg by mouth 2 (two) times daily as needed for anxiety.  0   amLODipine (NORVASC) 5 MG tablet TAKE 1 TABLET BY MOUTH EVERY DAY 90 tablet 2   Cholecalciferol (VITAMIN D3) 10 MCG (400 UNIT) tablet 800 mg daily in the afternoon.     levothyroxine (SYNTHROID) 125 MCG tablet Take 125 mcg by mouth daily before breakfast.     metFORMIN (GLUCOPHAGE-XR) 500 MG 24 hr tablet SMARTSIG:1 Tablet(s) By Mouth Every Evening     Multiple Vitamins-Minerals (EQ MULTIVITAMINS ADULT GUMMY PO) daily in the afternoon.     nebivolol (BYSTOLIC) 5 MG tablet Take 5 mg by mouth daily.     spironolactone (ALDACTONE) 50 MG tablet Take 50 mg by mouth daily.     SYNTHROID 137 MCG tablet Take 137 mcg by mouth every morning.     No current facility-administered medications for  this visit.    Allergies  Allergen Reactions   Ace Inhibitors Swelling   Lisinopril Swelling    angiodema   Codeine Nausea And Vomiting   Physical Exam: BP 140/78   Pulse 71   Ht 5\' 2"  (1.575 m)   Wt 162 lb 8 oz (73.7 kg)   SpO2 99%   BMI 29.72 kg/m  Well developed and well nourished in no acute distress HENT normal Neck supple with JVP-flat Lungs Clear Device pocket well healed; without hematoma or erythema.  There is no tethering  Regular rate and rhythm, no  gallop No  murmur Abd-soft with active BS No Clubbing cyanosis No LE  edema Skin-warm and dry A & Oriented  Grossly normal sensory and motor function  EKG:A pacing @ 71 22/07/37    Assessment and  Plan     Vasovagal syncope  Hypertension  Pacemaker - biotronik  The patient's device was interrogated.  The information was reviewed. No changes were made in the programming.        Atrial tachycardia   ACE inhibitor-angioedema  Depression    No interval syncope.  Continue her current device programmed settings.  Blood pressure remains elevated but I do not know if some of this is whitecoat hypertension.  At the dentist last week it was in the 120s.  We will decrease her Aldactone from 5--2.5 because of associated peripheral edema. Continue her spironolactone at 50.  Potassium measurements 5/22 were normal.  Some interval atrial arrhythmia is detected by her device.  Longest episode is less than 2 minutes.  This would qualify as SCAF.  No anticoagulation is indicated.  Lengthy discussion regarding depression.  Encouraged her to follow-up with her PCP and discuss this with her.  They have an appointment next week.     I,Stephanie Williams,acting as a Education administrator for Virl Axe, MD.,have documented all relevant documentation on the behalf of Virl Axe, MD,as directed by  Virl Axe, MD while in the presence of Virl Axe, MD.  I, Virl Axe, MD, have reviewed all documentation for this visit. The  documentation on 09/21/20 for the exam, diagnosis, procedures, and orders are all accurate and complete.

## 2020-09-21 ENCOUNTER — Ambulatory Visit (INDEPENDENT_AMBULATORY_CARE_PROVIDER_SITE_OTHER): Payer: PPO | Admitting: Internal Medicine

## 2020-09-21 ENCOUNTER — Encounter: Payer: Self-pay | Admitting: Internal Medicine

## 2020-09-21 ENCOUNTER — Other Ambulatory Visit: Payer: Self-pay

## 2020-09-21 VITALS — BP 140/78 | HR 71 | Ht 62.0 in | Wt 162.5 lb

## 2020-09-21 DIAGNOSIS — I471 Supraventricular tachycardia: Secondary | ICD-10-CM

## 2020-09-21 DIAGNOSIS — Z950 Presence of cardiac pacemaker: Secondary | ICD-10-CM | POA: Diagnosis not present

## 2020-09-21 DIAGNOSIS — I493 Ventricular premature depolarization: Secondary | ICD-10-CM | POA: Diagnosis not present

## 2020-09-21 MED ORDER — AMLODIPINE BESYLATE 5 MG PO TABS: 2.5000 mg | ORAL_TABLET | Freq: Every day | ORAL | 3 refills | Status: DC

## 2020-09-21 NOTE — Patient Instructions (Signed)
Medication Instructions:  Your physician has recommended you make the following change in your medication:   Decrease your Amlodipine from 5mg  to 2.5mg  -  You may take 1/2 tablet by mouth daily.    *If you need a refill on your cardiac medications before your next appointment, please call your pharmacy*   Lab Work: None ordered.  If you have labs (blood work) drawn today and your tests are completely normal, you will receive your results only by: [place] (if you have MyChart) OR A paper copy in the mail If you have any lab test that is abnormal or we need to change your treatment, we will call you to review the results.   Testing/Procedures: None ordered.    Follow-Up: At [facility], you and your health needs are our priority.  As part of our continuing mission to provide you with exceptional heart care, we have created designated Provider Care Teams.  These Care Teams include your primary Cardiologist (physician) and Advanced Practice Providers (APPs -  Physician Assistants and Nurse Practitioners) who all work together to provide you with the care you need, when you need it.  We recommend signing up for the patient portal called "MyChart".  Sign up information is provided on this After Visit Summary.  MyChart is used to connect with patients for Virtual Visits (Telemedicine).  Patients are able to view lab/test results, encounter notes, upcoming appointments, etc.  Non-urgent messages can be sent to your provider as well.   To learn more about what you can do with MyChart, go to [email or web address].    Your next appointment:   12 month(s)  The format for your next appointment:   In Person  Provider:   Virl Axe, MD

## 2020-10-01 ENCOUNTER — Other Ambulatory Visit: Payer: Self-pay | Admitting: Internal Medicine

## 2020-10-03 DIAGNOSIS — R7301 Impaired fasting glucose: Secondary | ICD-10-CM | POA: Diagnosis not present

## 2020-10-03 DIAGNOSIS — E785 Hyperlipidemia, unspecified: Secondary | ICD-10-CM | POA: Diagnosis not present

## 2020-10-09 DIAGNOSIS — Z1231 Encounter for screening mammogram for malignant neoplasm of breast: Secondary | ICD-10-CM | POA: Diagnosis not present

## 2020-10-10 DIAGNOSIS — R7303 Prediabetes: Secondary | ICD-10-CM | POA: Diagnosis not present

## 2020-10-10 DIAGNOSIS — Z23 Encounter for immunization: Secondary | ICD-10-CM | POA: Diagnosis not present

## 2020-10-10 DIAGNOSIS — I471 Supraventricular tachycardia: Secondary | ICD-10-CM | POA: Diagnosis not present

## 2020-10-10 DIAGNOSIS — I129 Hypertensive chronic kidney disease with stage 1 through stage 4 chronic kidney disease, or unspecified chronic kidney disease: Secondary | ICD-10-CM | POA: Diagnosis not present

## 2020-10-10 DIAGNOSIS — E785 Hyperlipidemia, unspecified: Secondary | ICD-10-CM | POA: Diagnosis not present

## 2020-10-10 DIAGNOSIS — F419 Anxiety disorder, unspecified: Secondary | ICD-10-CM | POA: Diagnosis not present

## 2020-10-10 DIAGNOSIS — F339 Major depressive disorder, recurrent, unspecified: Secondary | ICD-10-CM | POA: Diagnosis not present

## 2020-10-10 DIAGNOSIS — Z1331 Encounter for screening for depression: Secondary | ICD-10-CM | POA: Diagnosis not present

## 2020-10-10 DIAGNOSIS — Z1339 Encounter for screening examination for other mental health and behavioral disorders: Secondary | ICD-10-CM | POA: Diagnosis not present

## 2020-10-10 DIAGNOSIS — M8580 Other specified disorders of bone density and structure, unspecified site: Secondary | ICD-10-CM | POA: Diagnosis not present

## 2020-10-10 DIAGNOSIS — Z0000 Encounter for general adult medical examination without abnormal findings: Secondary | ICD-10-CM | POA: Diagnosis not present

## 2020-10-10 DIAGNOSIS — N1831 Chronic kidney disease, stage 3a: Secondary | ICD-10-CM | POA: Diagnosis not present

## 2020-11-01 DIAGNOSIS — H52203 Unspecified astigmatism, bilateral: Secondary | ICD-10-CM | POA: Diagnosis not present

## 2020-11-01 DIAGNOSIS — Z961 Presence of intraocular lens: Secondary | ICD-10-CM | POA: Diagnosis not present

## 2020-11-01 DIAGNOSIS — Z9889 Other specified postprocedural states: Secondary | ICD-10-CM | POA: Diagnosis not present

## 2020-11-01 DIAGNOSIS — H43393 Other vitreous opacities, bilateral: Secondary | ICD-10-CM | POA: Diagnosis not present

## 2020-11-01 DIAGNOSIS — H524 Presbyopia: Secondary | ICD-10-CM | POA: Diagnosis not present

## 2020-11-01 DIAGNOSIS — H5213 Myopia, bilateral: Secondary | ICD-10-CM | POA: Diagnosis not present

## 2021-01-02 IMAGING — CR DG CHEST 2V
2 series · 2 of 2 positions shown · non-contrast
Comparison: 05/15/2017

CLINICAL DATA: Preoperative evaluation for shoulder replacement.

EXAM:
CHEST - 2 VIEW

[w chest pa]
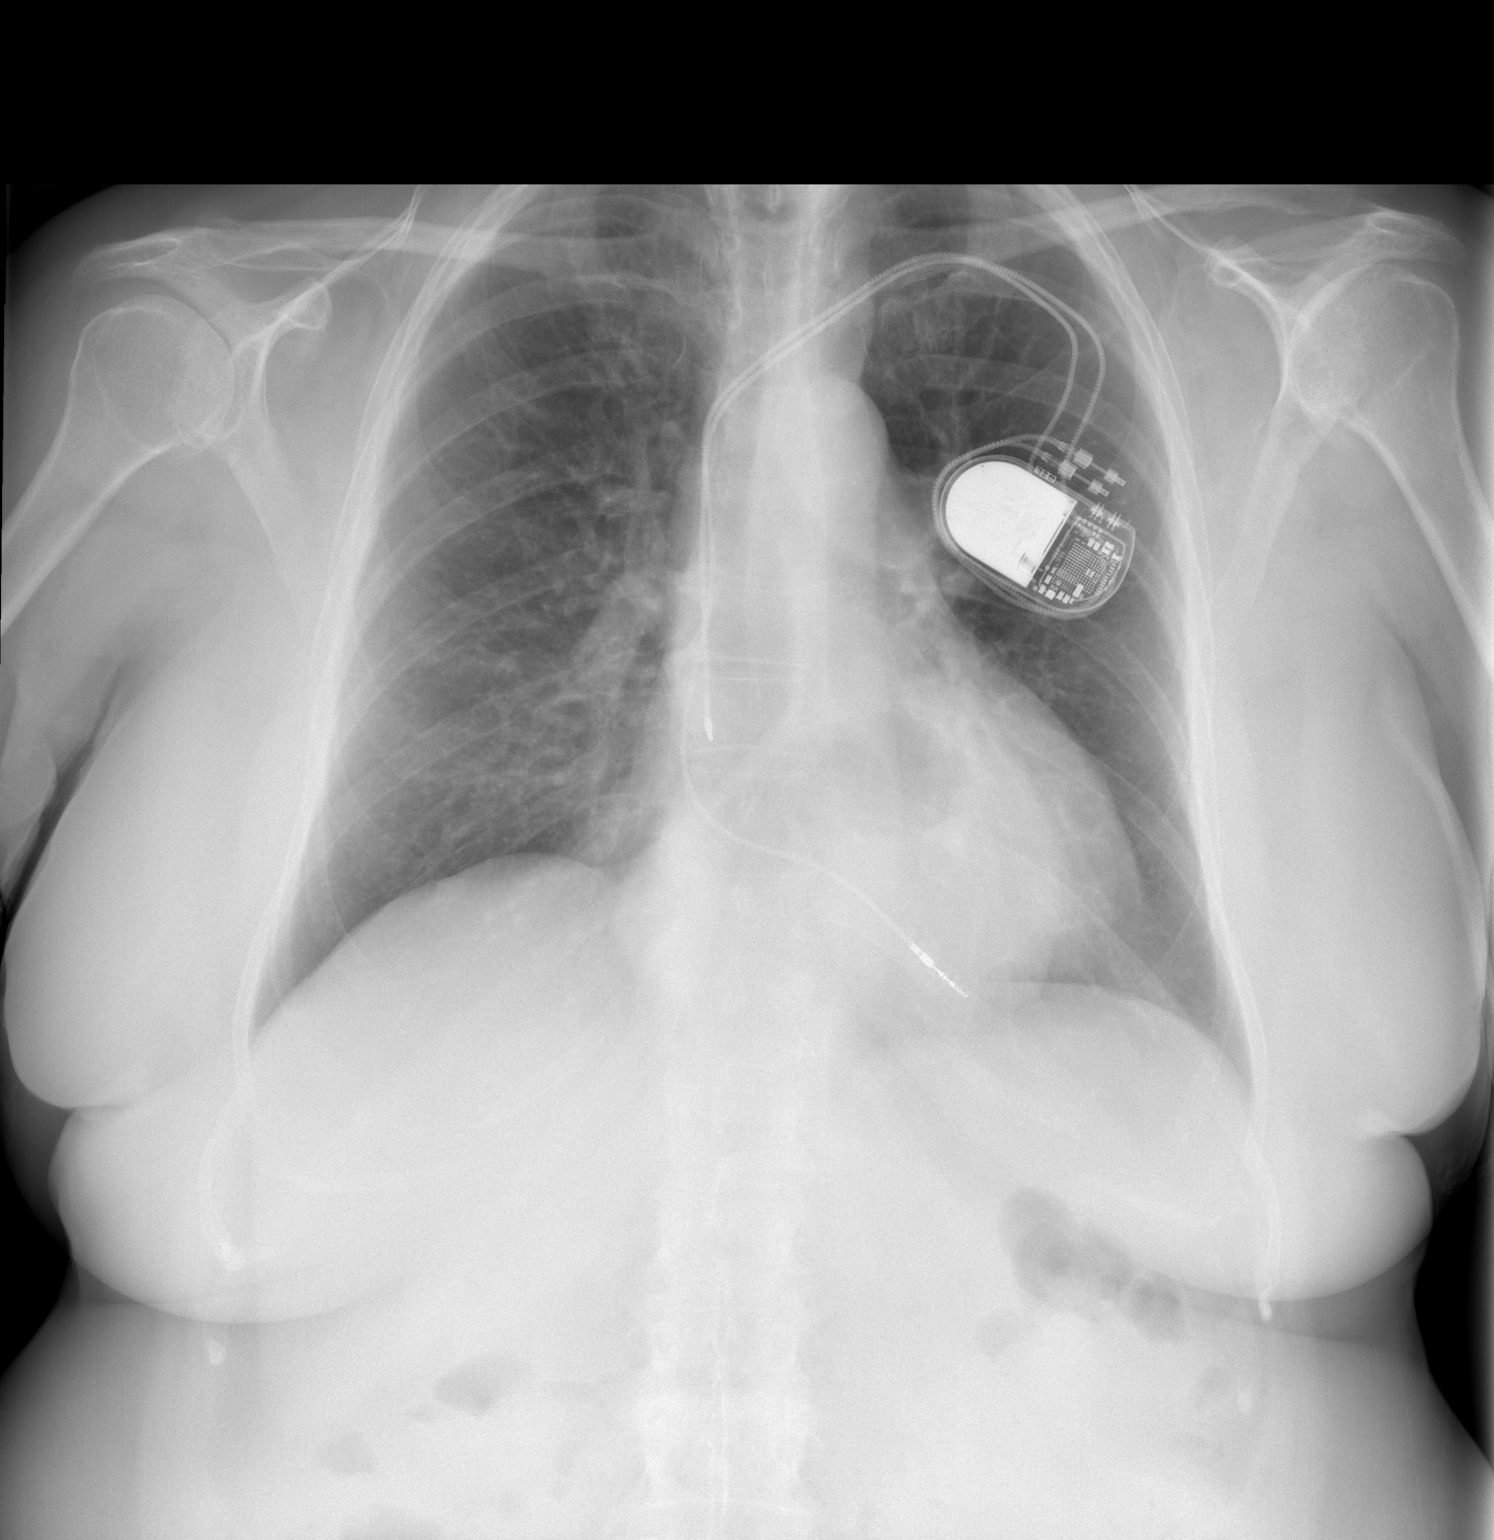

[w chest lat]
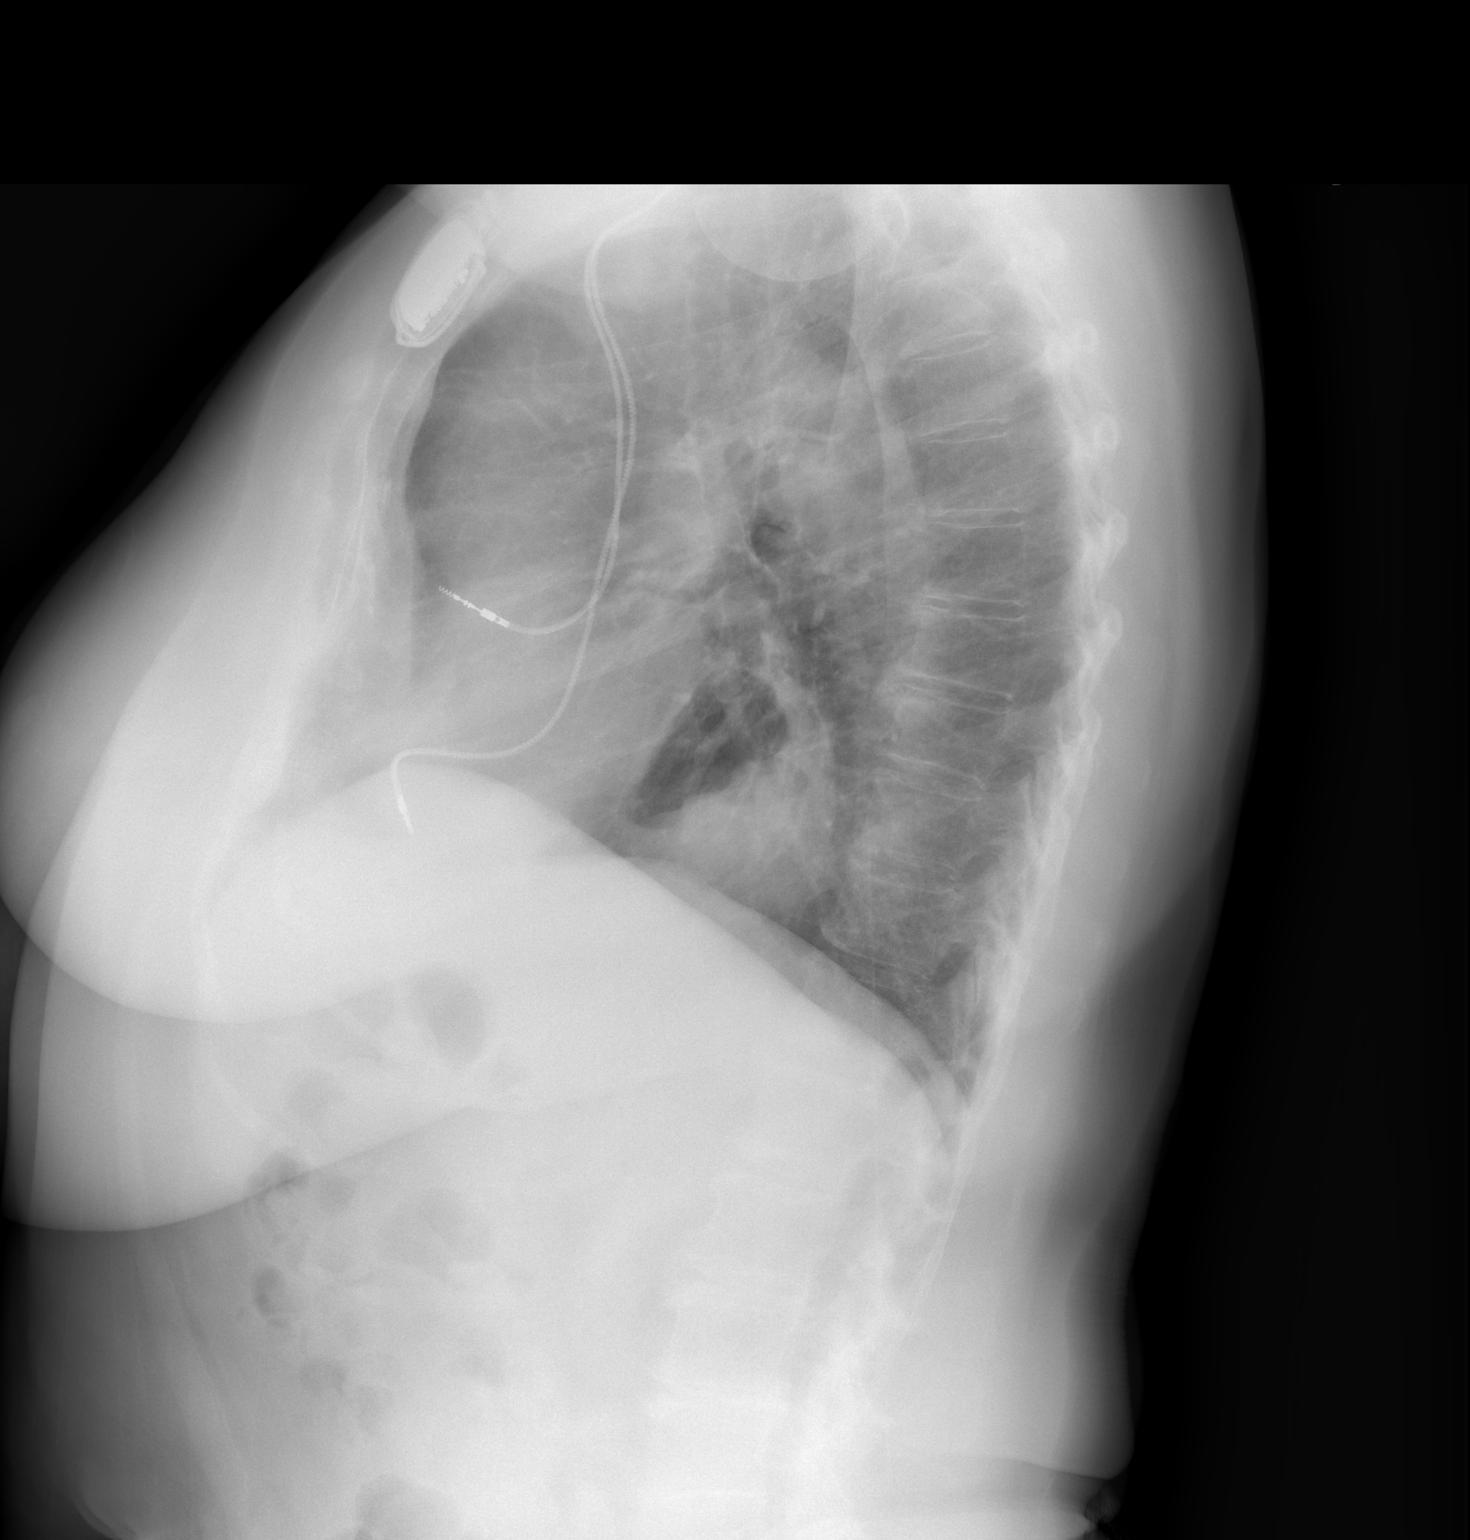

[2 of 2 positions shown; findings below may reference images not displayed]

FINDINGS: Heart size is normal. Dual lead pacemaker in place. Large hiatal
hernia. The pulmonary vascularity is normal. The lungs are clear. No
effusions. Chronic degenerative changes affect the shoulders.
IMPRESSION: No active cardiopulmonary disease. Large hiatal hernia. Dual lead
pacemaker.

## 2021-01-10 DIAGNOSIS — U071 COVID-19: Secondary | ICD-10-CM | POA: Diagnosis not present

## 2021-01-12 IMAGING — DX DG SHOULDER 1V*L*
1 series · 1 of 1 positions shown · non-contrast
Comparison: CT left shoulder 12/23/2018.

CLINICAL DATA: Status post left shoulder replacement today.

EXAM:
LEFT SHOULDER - 1 VIEW

[shoulder ap]
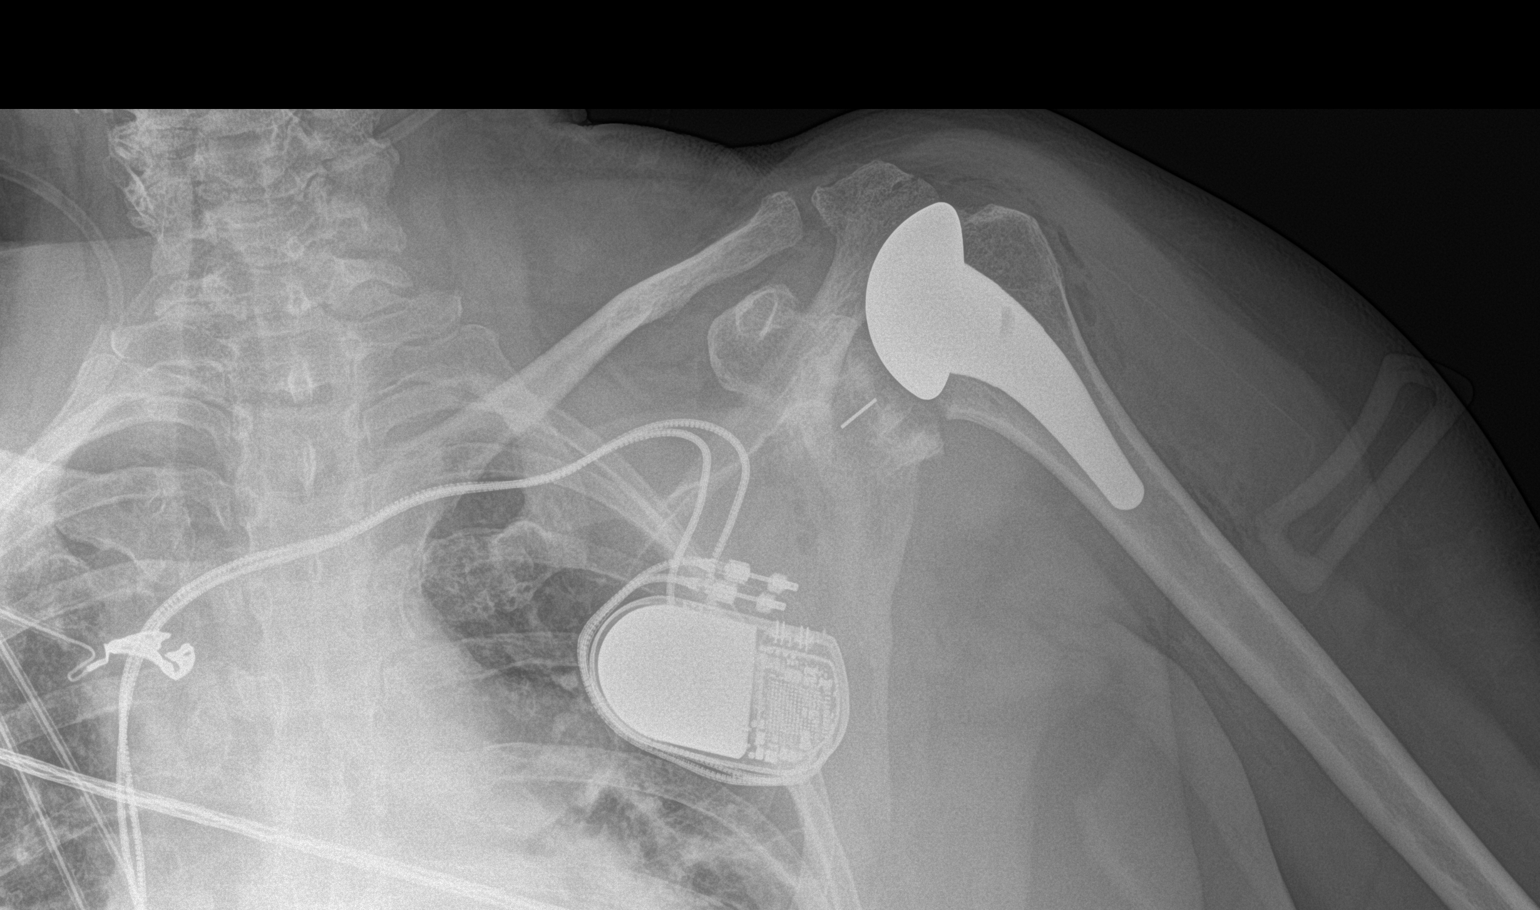

[1 of 1 positions shown; findings below may reference images not displayed]

FINDINGS: Left shoulder arthroplasty is in place. The device appears located.
Lucency through the base of the acromion is favored to represent
overlying soft tissue gas but attention is recommended on follow-up
examinations.
IMPRESSION: Status post left shoulder replacement. Lucency through the base of
the acromion is likely due to overlying soft tissue gas but
recommend attention on follow-up examinations for nondisplaced
fracture.

## 2021-01-17 IMAGING — CR DG SHOULDER 2+V*L*
3 series · 3 of 3 positions shown · non-contrast
Comparison: Radiograph 01/27/2019

CLINICAL DATA: Postoperative pain

EXAM:
LEFT SHOULDER - 2+ VIEW

[w shoulder internal left (1 of 2)]
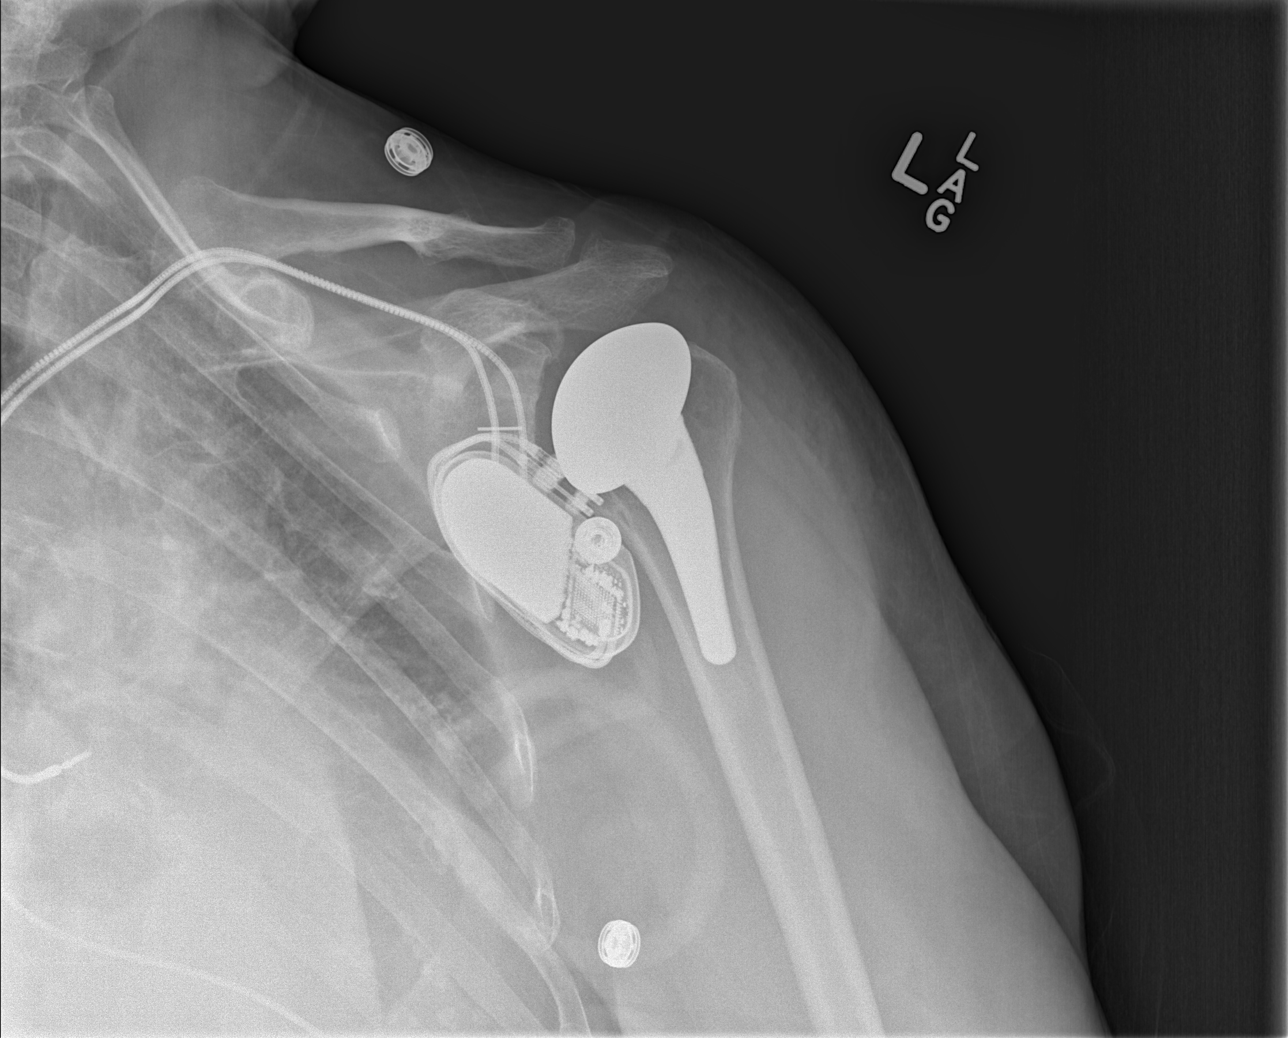

[w shoulder axillary left]
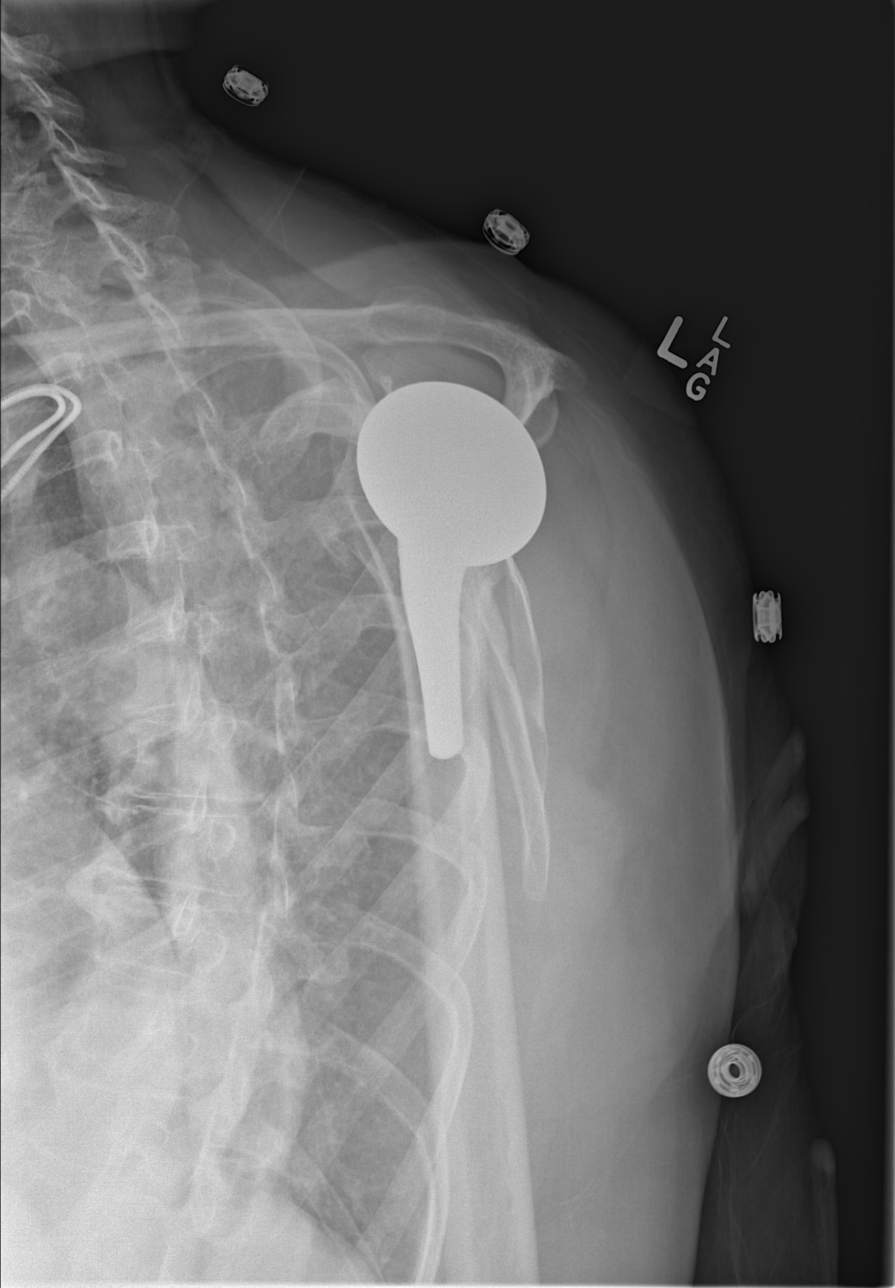

[w shoulder internal left (2 of 2)]
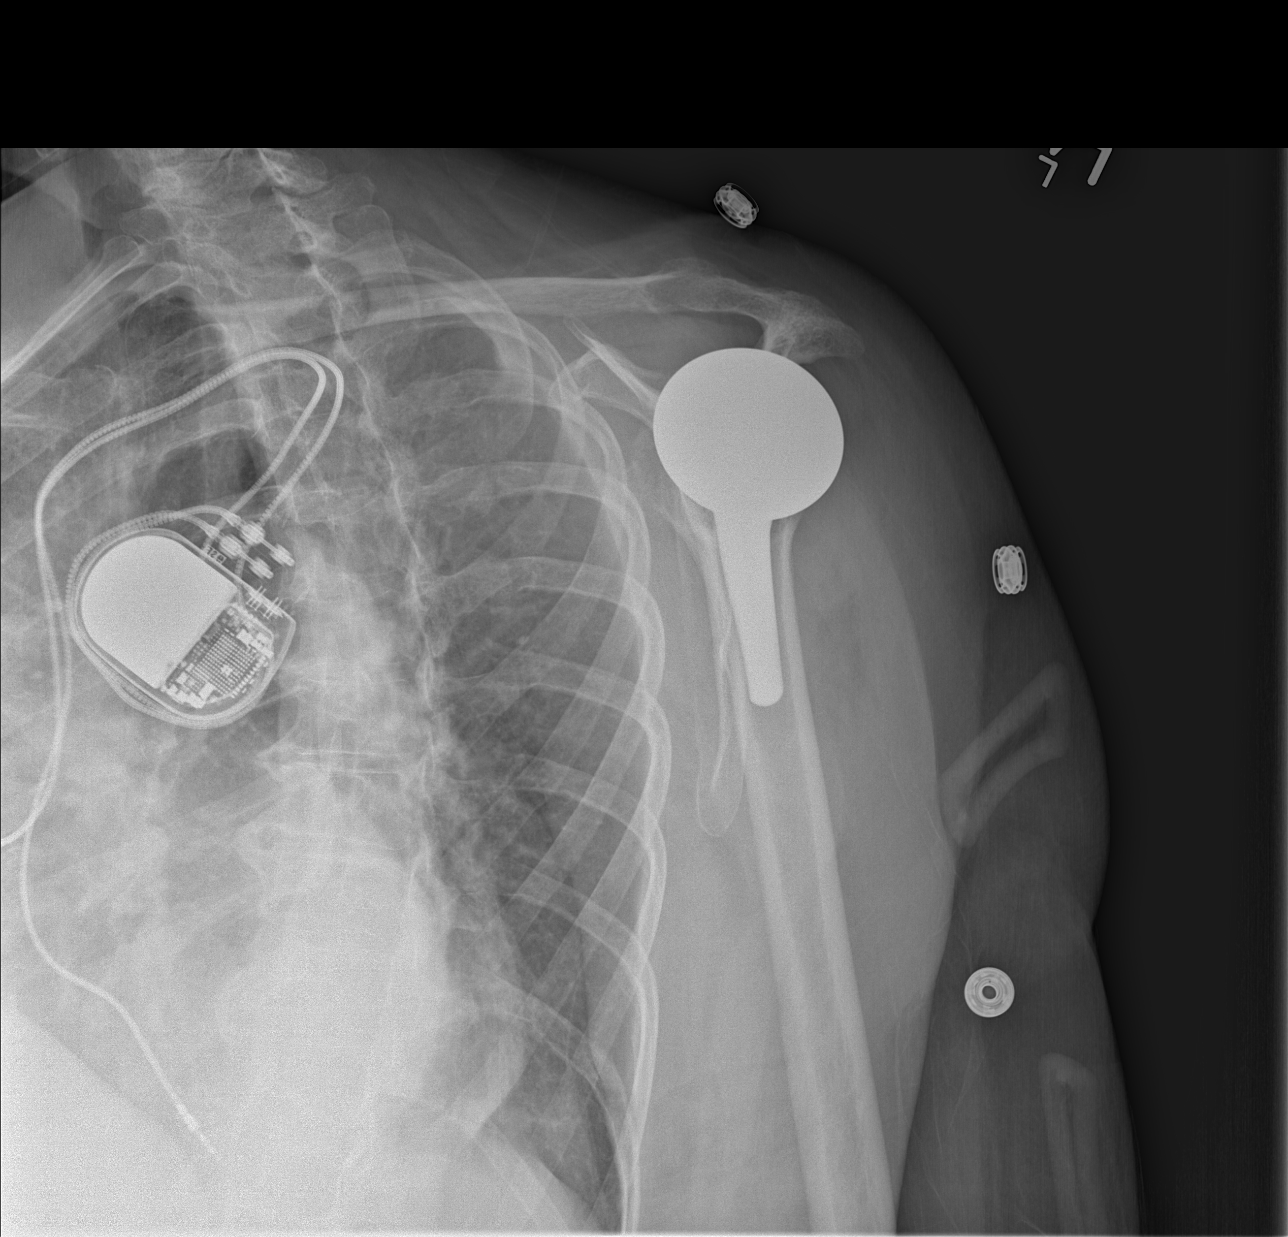

[3 of 3 positions shown; findings below may reference images not displayed]

FINDINGS: The previously seen lucency through the base of the acromion is no
longer evident and likely reflected superimposed soft tissue gas.
There is however persistent soft tissue swelling and overlying skin
thickening involving the left shoulder. The patient's left shoulder
arthroplasty remains in grossly normal alignment. A pacer pack
overlies portion of the left glenoid limiting evaluation. The
acromioclavicular and coracoclavicular intervals are maintained.
Included portions of the left chest wall demonstrates some
atelectatic change.
IMPRESSION: 1. The previously seen lucency through the base of the acromion is
no longer evident and likely reflected superimposed soft tissue gas.
2. Persistent soft tissue swelling and overlying skin thickening
involving the left shoulder. Correlate with physical exam findings
to exclude a cellulitis.

## 2021-02-14 DIAGNOSIS — E785 Hyperlipidemia, unspecified: Secondary | ICD-10-CM | POA: Diagnosis not present

## 2021-02-14 DIAGNOSIS — E890 Postprocedural hypothyroidism: Secondary | ICD-10-CM | POA: Diagnosis not present

## 2021-02-14 DIAGNOSIS — R7301 Impaired fasting glucose: Secondary | ICD-10-CM | POA: Diagnosis not present

## 2021-02-21 DIAGNOSIS — G629 Polyneuropathy, unspecified: Secondary | ICD-10-CM | POA: Diagnosis not present

## 2021-02-21 DIAGNOSIS — R7301 Impaired fasting glucose: Secondary | ICD-10-CM | POA: Diagnosis not present

## 2021-02-21 DIAGNOSIS — N189 Chronic kidney disease, unspecified: Secondary | ICD-10-CM | POA: Diagnosis not present

## 2021-02-21 DIAGNOSIS — E890 Postprocedural hypothyroidism: Secondary | ICD-10-CM | POA: Diagnosis not present

## 2021-02-21 DIAGNOSIS — I10 Essential (primary) hypertension: Secondary | ICD-10-CM | POA: Diagnosis not present

## 2021-02-21 DIAGNOSIS — E785 Hyperlipidemia, unspecified: Secondary | ICD-10-CM | POA: Diagnosis not present

## 2021-04-11 DIAGNOSIS — R7303 Prediabetes: Secondary | ICD-10-CM | POA: Diagnosis not present

## 2021-04-11 DIAGNOSIS — I129 Hypertensive chronic kidney disease with stage 1 through stage 4 chronic kidney disease, or unspecified chronic kidney disease: Secondary | ICD-10-CM | POA: Diagnosis not present

## 2021-04-11 DIAGNOSIS — N644 Mastodynia: Secondary | ICD-10-CM | POA: Diagnosis not present

## 2021-04-11 DIAGNOSIS — F339 Major depressive disorder, recurrent, unspecified: Secondary | ICD-10-CM | POA: Diagnosis not present

## 2021-04-26 DIAGNOSIS — K219 Gastro-esophageal reflux disease without esophagitis: Secondary | ICD-10-CM | POA: Diagnosis not present

## 2021-04-26 DIAGNOSIS — R1011 Right upper quadrant pain: Secondary | ICD-10-CM | POA: Diagnosis not present

## 2021-04-29 ENCOUNTER — Other Ambulatory Visit: Payer: Self-pay | Admitting: Internal Medicine

## 2021-04-29 ENCOUNTER — Other Ambulatory Visit (HOSPITAL_COMMUNITY): Payer: Self-pay | Admitting: Internal Medicine

## 2021-04-29 DIAGNOSIS — R1011 Right upper quadrant pain: Secondary | ICD-10-CM

## 2021-04-30 ENCOUNTER — Ambulatory Visit (HOSPITAL_COMMUNITY)
Admission: RE | Admit: 2021-04-30 | Discharge: 2021-04-30 | Disposition: A | Discharge status: Home / Self Care | Payer: PPO | Source: Ambulatory Visit | Attending: Internal Medicine | Admitting: Internal Medicine

## 2021-04-30 ENCOUNTER — Other Ambulatory Visit: Payer: Self-pay

## 2021-04-30 DIAGNOSIS — R1011 Right upper quadrant pain: Secondary | ICD-10-CM | POA: Insufficient documentation

## 2021-05-20 DIAGNOSIS — N644 Mastodynia: Secondary | ICD-10-CM | POA: Diagnosis not present

## 2021-05-22 ENCOUNTER — Other Ambulatory Visit: Payer: Self-pay

## 2021-05-22 ENCOUNTER — Ambulatory Visit (INDEPENDENT_AMBULATORY_CARE_PROVIDER_SITE_OTHER): Payer: PPO

## 2021-05-22 ENCOUNTER — Ambulatory Visit: Payer: PPO | Admitting: Podiatry

## 2021-05-22 ENCOUNTER — Encounter: Payer: Self-pay | Admitting: Podiatry

## 2021-05-22 DIAGNOSIS — M2041 Other hammer toe(s) (acquired), right foot: Secondary | ICD-10-CM | POA: Diagnosis not present

## 2021-05-22 DIAGNOSIS — M778 Other enthesopathies, not elsewhere classified: Secondary | ICD-10-CM

## 2021-05-22 DIAGNOSIS — M2042 Other hammer toe(s) (acquired), left foot: Secondary | ICD-10-CM

## 2021-05-22 DIAGNOSIS — L84 Corns and callosities: Secondary | ICD-10-CM | POA: Diagnosis not present

## 2021-05-22 NOTE — Progress Notes (Signed)
Subjective:   Patient ID: Cindy [name], female   DOB: 76 y.o.   MRN: [id number]   HPI Patient states she is developed a lot of pain on top of both her feet now with the left being more swollen and has a lesion on the inside of the third toe left its been very tender and make shoe gear difficult   ROS      Objective:  Physical Exam  Neurovascular status intact with inflammation pain of the extensor tendon complex bilateral with patient also found to have lesion on the third digit left inner side with fluid formation and keratotic tissue with pressure     Assessment:  Extensor tendinitis bilateral with inflammation along with lesion third digit left inner phalangeal joint painful     Plan:  H&P all conditions reviewed sterile prep done and did inject the extensor complex bilateral 3 mg Kenalog 5 mg Xylocaine after discussing chances for risk with that.  I then carefully injected the third digit left medial side and debrided the lesion applied padding and discussed possible surgery at 1 point in future  X-rays indicate there is some diminished joint spaces across the metatarsocuneiform joint with indications of arthritis within this area bilateral

## 2021-05-24 DIAGNOSIS — M1712 Unilateral primary osteoarthritis, left knee: Secondary | ICD-10-CM | POA: Diagnosis not present

## 2021-05-24 DIAGNOSIS — M25562 Pain in left knee: Secondary | ICD-10-CM | POA: Diagnosis not present

## 2021-06-04 DIAGNOSIS — R1011 Right upper quadrant pain: Secondary | ICD-10-CM | POA: Diagnosis not present

## 2021-06-06 DIAGNOSIS — N6001 Solitary cyst of right breast: Secondary | ICD-10-CM | POA: Diagnosis not present

## 2021-06-25 ENCOUNTER — Ambulatory Visit: Payer: Self-pay | Admitting: Surgery

## 2021-06-25 DIAGNOSIS — M1712 Unilateral primary osteoarthritis, left knee: Secondary | ICD-10-CM | POA: Diagnosis not present

## 2021-06-25 NOTE — Progress Notes (Signed)
Sent message, via epic in basket, requesting orders in epic from surgeon.  

## 2021-06-28 ENCOUNTER — Encounter: Payer: Self-pay | Admitting: Internal Medicine

## 2021-06-28 NOTE — Progress Notes (Addendum)
COVID Vaccine Completed: yes x2 ?Date COVID Vaccine completed: 05/13/19, 06/08/19 ?Has received booster: ?COVID vaccine manufacturer: Pfizer     ?Date of COVID positive in last 90 days: no ? ?PCP - Cristie Hem, MD ?Cardiologist - Virl Axe, MD ? ?Chest x-ray - n/a ?EKG - 09/21/20 Req ?Stress Test - 11/15/13 Epic ?ECHO - n/a ?Cardiac Cath - 2015 p[er pt ?Pacemaker/ICD device last checked: 05/03/20 Epic, checked in June per pt ?Spinal Cord Stimulator: n/a ? ?Bowel Prep - no ? ?Sleep Study - n/a ?CPAP -  ? ?Fasting Blood Sugar - pre, no checks or medications ?Checks Blood Sugar _____ times a day ? ?Blood Thinner Instructions: n/a ?Aspirin Instructions: ?Last Dose: ? ?Activity level: Can go up a flight of stairs and perform activities of daily living without stopping and without symptoms of chest pain or shortness of breath. ?   ?Anesthesia review: pacemaker, HTN, a tachycardia, graves, anemia, syncope, pre DM, CKD ? ?Patient denies shortness of breath, fever, cough and chest pain at PAT appointment ? ? ?Patient verbalized understanding of instructions that were given to them at the PAT appointment. Patient was also instructed that they will need to review over the PAT instructions again at home before surgery.  ?

## 2021-06-28 NOTE — Patient Instructions (Addendum)
DUE TO COVID-19 ONLY ONE VISITOR  (aged 76 and older)  IS ALLOWED TO COME WITH YOU AND STAY IN THE WAITING ROOM ONLY DURING PRE OP AND PROCEDURE.   ?**NO VISITORS ARE ALLOWED IN THE SHORT STAY AREA OR RECOVERY ROOM!!** ? ? Your procedure is scheduled on: 07/18/21 ? ? Report to [facility] Main Entrance ? ?  Report to admitting at 10:45 AM ? ? Call this number if you have problems the morning of surgery [phone] ? ? Do not eat food :After Midnight. ? ? After Midnight you may have the following liquids until 10:00 AM DAY OF SURGERY ? ?Water ?Black Coffee (sugar ok, NO MILK/CREAM OR CREAMERS)  ?Tea (sugar ok, NO MILK/CREAM OR CREAMERS) regular and decaf                             ?Plain Jell-O (NO RED)                                           ?Fruit ices (not with fruit pulp, NO RED)                                     ?Popsicles (NO RED)                                                                  ?Juice: apple, WHITE grape, WHITE cranberry ?Sports drinks like Gatorade (NO RED) ?Clear broth(vegetable,chicken,beef) ? ?FOLLOW BOWEL PREP AND ANY ADDITIONAL PRE OP INSTRUCTIONS YOU RECEIVED FROM YOUR SURGEON'S OFFICE!!! ?  ?  ?Oral Hygiene is also important to reduce your risk of infection.                                    ?Remember - BRUSH YOUR TEETH THE MORNING OF SURGERY WITH YOUR REGULAR TOOTHPASTE ? ? Take these medicines the morning of surgery with A SIP OF WATER: Tylenol, Xanax, Amlodipine, Bupropion, Synthroid, Bystolic, Pantoprazole. ?                  ?           You may not have any metal on your body including hair pins, jewelry, and body piercing ? ?           Do not wear make-up, lotions, powders, perfumes, or deodorant ? ?Do not wear nail polish including gel and S&S, artificial/acrylic nails, or any other type of covering on natural nails including finger and toenails. If you have artificial nails, gel coating, etc. that needs to be removed by a nail salon please have this removed prior  to surgery or surgery may need to be canceled/ delayed if the surgeon/ anesthesia feels like they are unable to be safely monitored.  ? ?Do not shave  48 hours prior to surgery.  ? ? Do not bring valuables to the hospital. [facility] NOT ?  RESPONSIBLE   FOR VALUABLES. ?  ? Patients discharged on the day of surgery will not be allowed to drive home.  Someone NEEDS to stay with you for the first 24 hours after anesthesia. ? ? Special Instructions: Bring a copy of your healthcare power of attorney and living will documents         the day of surgery if you haven't scanned them before. ? ?            Please read over the following fact sheets you were given: IF [place] [phone]- Apolonio Schneiders ? ?   [facility] - Preparing for Surgery ?Before surgery, you can play an important role.  Because skin is not sterile, your skin needs to be as free of germs as possible.  You can reduce the number of germs on your skin by washing with CHG (chlorahexidine gluconate) soap before surgery.  CHG is an antiseptic cleaner which kills germs and bonds with the skin to continue killing germs even after washing. ?Please DO NOT use if you have an allergy to CHG or antibacterial soaps.  If your skin becomes reddened/irritated stop using the CHG and inform your nurse when you arrive at Short Stay. ?Do not shave (including legs and underarms) for at least 48 hours prior to the first CHG shower.  You may shave your face/neck. ? ?Please follow these instructions carefully: ? 1.  Shower with CHG Soap the night before surgery and the  morning of surgery. ? 2.  If you choose to wash your hair, wash your hair first as usual with your normal  shampoo. ? 3.  After you shampoo, rinse your hair and body thoroughly to remove the shampoo.                            ? 4.  Use CHG as you would any other liquid soap.  You can apply chg directly to the skin and wash.  Gently with a scrungie or  clean washcloth. ? 5.  Apply the CHG Soap to your body ONLY FROM THE NECK DOWN.   Do   not use on face/ open      ?                     Wound or open sores. Avoid contact with eyes, ears mouth and   genitals (private parts).  ?                     Production manager,  Genitals (private parts) with your normal soap. ?            6.  Wash thoroughly, paying special attention to the area where your    surgery  will be performed. ? 7.  Thoroughly rinse your body with warm water from the neck down. ? 8.  DO NOT shower/wash with your normal soap after using and rinsing off the CHG Soap. ?               9.  Pat yourself dry with a clean towel. ?           10.  Wear clean pajamas. ?           11.  Place clean sheets on your bed the night of your first shower and do not  sleep with pets. ?Day of Surgery : ?Do not  apply any lotions/deodorants the morning of surgery.  Please wear clean clothes to the hospital/surgery center. ? ?FAILURE TO FOLLOW THESE INSTRUCTIONS MAY RESULT IN THE CANCELLATION OF YOUR SURGERY ? ?PATIENT SIGNATURE_________________________________ ? ?NURSE SIGNATURE__________________________________ ? ?________________________________________________________________________  ?

## 2021-06-28 NOTE — Progress Notes (Signed)
PERIOPERATIVE PRESCRIPTION FOR IMPLANTED CARDIAC DEVICE PROGRAMMING ? ?Patient Information: ?Name:  Cindy [name]  ?DOB:  1945-04-10  ?MRN:  [id number]  ?  ?Planned Procedure:  Laparoscopic Cholecystectomy  ?Surgeon:  Dr. Osvaldo Human  ?Date of Procedure:  07/20/21  ?Cautery will be used.  ?Position during surgery:  unknown  ? ?Please send documentation back to:  ?[facility] (Fax # [phone] ?Device Information: ? ?Clinic EP Physician:  Virl Axe, MD  ? ?Device Type:  Pacemaker ?Manufacturer and Phone #:  Biotronik: [phone] ?Pacemaker Dependent?:  No. ?Date of Last Device Check:  09/21/20 Normal Device Function?:  Yes.   ? ?Electrophysiologist's Recommendations: ? ?Have magnet available. ?Provide continuous ECG monitoring when magnet is used or reprogramming is to be performed.  ?Procedure may interfere with device function.  Magnet should be placed over device during procedure. ? ?Per Device Clinic Standing Orders, ?Wanda Plump, RN  ?12:26 PM 06/28/2021  ?

## 2021-07-01 ENCOUNTER — Other Ambulatory Visit: Payer: Self-pay

## 2021-07-01 ENCOUNTER — Encounter (HOSPITAL_COMMUNITY): Payer: Self-pay

## 2021-07-01 ENCOUNTER — Encounter (HOSPITAL_COMMUNITY)
Admission: RE | Admit: 2021-07-01 | Discharge: 2021-07-01 | Disposition: A | Discharge status: Home / Self Care | Payer: PPO | Source: Ambulatory Visit | Attending: Surgery | Admitting: Surgery

## 2021-07-01 VITALS — BP 146/88 | HR 60 | Temp 98.6°F | Resp 16 | Ht 62.0 in | Wt 160.0 lb

## 2021-07-01 DIAGNOSIS — K8050 Calculus of bile duct without cholangitis or cholecystitis without obstruction: Secondary | ICD-10-CM | POA: Insufficient documentation

## 2021-07-01 DIAGNOSIS — E0500 Thyrotoxicosis with diffuse goiter without thyrotoxic crisis or storm: Secondary | ICD-10-CM | POA: Diagnosis not present

## 2021-07-01 DIAGNOSIS — Z01812 Encounter for preprocedural laboratory examination: Secondary | ICD-10-CM | POA: Diagnosis not present

## 2021-07-01 DIAGNOSIS — Z950 Presence of cardiac pacemaker: Secondary | ICD-10-CM | POA: Diagnosis not present

## 2021-07-01 DIAGNOSIS — E890 Postprocedural hypothyroidism: Secondary | ICD-10-CM | POA: Diagnosis not present

## 2021-07-01 DIAGNOSIS — R55 Syncope and collapse: Secondary | ICD-10-CM | POA: Diagnosis not present

## 2021-07-01 DIAGNOSIS — I10 Essential (primary) hypertension: Secondary | ICD-10-CM | POA: Insufficient documentation

## 2021-07-01 DIAGNOSIS — Z87891 Personal history of nicotine dependence: Secondary | ICD-10-CM | POA: Insufficient documentation

## 2021-07-01 DIAGNOSIS — Z01818 Encounter for other preprocedural examination: Secondary | ICD-10-CM

## 2021-07-01 HISTORY — DX: Other cervical disc degeneration, unspecified cervical region: M50.30

## 2021-07-01 LAB — BASIC METABOLIC PANEL
Anion gap: 8 (ref 5–15)
BUN: 32 mg/dL — ABNORMAL HIGH (ref 8–23)
CO2: 25 mmol/L (ref 22–32)
Calcium: 9.3 mg/dL (ref 8.9–10.3)
Chloride: 104 mmol/L (ref 98–111)
Creatinine, Ser: 1.18 mg/dL — ABNORMAL HIGH (ref 0.44–1.00)
GFR, Estimated: 48 mL/min — ABNORMAL LOW (ref >60)
Glucose, Bld: 84 mg/dL (ref 70–99)
Potassium: 4.5 mmol/L (ref 3.5–5.1)
Sodium: 137 mmol/L (ref 135–145)

## 2021-07-01 LAB — CBC
HCT: 45.2 % (ref 36.0–46.0)
Hemoglobin: 15.1 g/dL — ABNORMAL HIGH (ref 12.0–15.0)
MCH: 31.8 pg (ref 26.0–34.0)
MCHC: 33.4 g/dL (ref 30.0–36.0)
MCV: 95.2 fL (ref 80.0–100.0)
Platelets: 277 10*3/uL (ref 150–400)
RBC: 4.75 MIL/uL (ref 3.87–5.11)
RDW: 12.8 % (ref 11.5–15.5)
WBC: 6 10*3/uL (ref 4.0–10.5)
nRBC: 0 % (ref 0.0–0.2)

## 2021-07-01 LAB — HEMOGLOBIN A1C
Hgb A1c MFr Bld: 5.6 % (ref 4.8–5.6)
Mean Plasma Glucose: 114.02 mg/dL

## 2021-07-01 LAB — GLUCOSE, CAPILLARY: Glucose-Capillary: 86 mg/dL (ref 70–99)

## 2021-07-02 ENCOUNTER — Encounter (HOSPITAL_COMMUNITY): Payer: Self-pay | Admitting: Physician Assistant

## 2021-07-02 NOTE — Progress Notes (Signed)
Anesthesia Chart Review ? ? Case: [id number] Date/Time: 07/18/21 1245  ? Procedure: LAPAROSCOPIC CHOLECYSTECTOMY  ? Anesthesia type: General  ? Pre-op diagnosis: BILIARY COLIC  ? Location[facility] RO[facility]  ? Surgeons: Jesusita Oka, MD  ? ?  ? ? ?DISCUSSION:76 y.o. former smoker with h/o HTN, Graves disease s/p ablation, pacemaker in place due to vasovagal syncope (device orders in 06/28/2021 progress note), biliary colic scheduled for above procedure 07/18/2021 with Dr. Reather Laurence.  ? ?H/o whitecoat syndrome.  ? ?BP Readings from Last 3 Encounters:  ?07/01/21 (!) 146/88  ?09/21/20 140/78  ?04/12/20 128/90  ?  ?Last seen by cardiology 09/21/2021. Stable at this visit.  ? ?Anticipate pt can proceed with planned procedure barring acute status change.   ?VS: BP (!) 146/88   Pulse 60   Temp 37 ?C (Oral)   Resp 16   Ht '5\' 2"'$  (1.575 m)   Wt 72.6 kg   SpO2 100%   BMI 29.26 kg/m?  ? ?PROVIDERS: ?Michael Boston, MD is PCP  ? ?Virl Axe, MD is Cardiologist  ?LABS: Labs reviewed: Acceptable for surgery. ?(all labs ordered are listed, but only abnormal results are displayed) ? ?Labs Reviewed  ?BASIC METABOLIC PANEL - Abnormal; Notable for the following components:  ?    Result Value  ? BUN 32 (*)   ? Creatinine, Ser 1.18 (*)   ? GFR, Estimated 48 (*)   ? All other components within normal limits  ?CBC - Abnormal; Notable for the following components:  ? Hemoglobin 15.1 (*)   ? All other components within normal limits  ?HEMOGLOBIN A1C  ?GLUCOSE, CAPILLARY  ? ? ? ?IMAGES: ? ? ?EKG: ? ? ?CV: ?Echo 03/28/2010 ?Study Conclusions  ? ? - Left ventricle: The cavity size was normal. Wall thickness was  ?   normal. Systolic function was normal. The estimated ejection  ?   fraction was in the range of 60% to 65%. Wall motion was normal;  ?   there were no regional wall motion abnormalities. Doppler  ?   parameters are consistent with abnormal left ventricular  ?   relaxation (grade 1 diastolic dysfunction).  ? - Left  atrium: The atrium was mildly to moderately dilated. ?Past Medical History:  ?Diagnosis Date  ? Abnormal nuclear stress test 12/15/2013  ? Anemia   ? hx iron def, no GI loss indentifed - resolved with diet change    ? Anxiety   ? Arthritis   ? Degenerative disc disease, cervical   ? Dysrhythmia   ? GERD (gastroesophageal reflux disease)   ? GRAVES' DISEASE 2013  ? I-131 ablation, now post tx hypothyroid state  ? HYPERTENSION   ? Impaired glucose tolerance test   ? Keloid 12/31/2010  ? incisional   ? Pacemaker -Biotronik-CLS 08/20/2010  ? Fainted easily  ? Postablative hypothyroidism   ? Qualifier: Diagnosis of  By: Joya Gaskins CMA, [place]    ? Pre-diabetes   ? Sciatica   ? Syncope 08/20/2010  ? s/p PPM 08/2010 for sxc pause on loop recorder causing same  ? Unspecified vitamin D deficiency   ? ? ?Past Surgical History:  ?Procedure Laterality Date  ? APPENDECTOMY    ? blephroplasty    ? BREAST BIOPSY Left 2001  ? guided excisional bx (L) breast microcalcification at 12 o'clock- Benign  ? BUNIONECTOMY Left   ? CATARACT EXTRACTION  2014  ? HIATAL HERNIA REPAIR N/A 04/10/2020  ? Procedure: LAPAROSCOPIC REPAIR OF TYPE  3 HIATAL HERNIA WITH NISSEN FUNDOPLICATION;  Surgeon: Johnathan Hausen, MD;  Location[facility];  Service: General;  Laterality: N/A;  ? LEFT HEART CATHETERIZATION WITH CORONARY ANGIOGRAM N/A 12/22/2013  ? Procedure: LEFT HEART CATHETERIZATION WITH CORONARY ANGIOGRAM;  Surgeon: Sinclair Grooms, MD;  Location: [facility] CATH LAB;  Service: Cardiovascular;  Laterality: N/A;  ? PACEMAKER INSERTION    ? Right ankle  06/2010  ? Trimalleolar fracture  ? TOTAL SHOULDER ARTHROPLASTY Left 01/27/2019  ? Procedure: TOTAL SHOULDER ARTHROPLASTY;  Surgeon: Tania Ade, MD;  Location: [facility];  Service: Orthopedics;  Laterality: Left;  ? ? ?MEDICATIONS: ? acetaminophen (TYLENOL) 650 MG CR tablet  ? ALPRAZolam (XANAX) 0.5 MG tablet  ? amLODipine (NORVASC) 5 MG tablet  ? buPROPion (WELLBUTRIN XL) 300 MG 24 hr tablet  ? Cholecalciferol  (VITAMIN D3) 20 MCG (800 UNIT) TABS  ? levothyroxine (SYNTHROID) 125 MCG tablet  ? Multiple Vitamins-Minerals (EQ MULTIVITAMINS ADULT GUMMY PO)  ? nebivolol (BYSTOLIC) 5 MG tablet  ? pantoprazole (PROTONIX) 40 MG tablet  ? spironolactone (ALDACTONE) 50 MG tablet  ? ?No current facility-administered medications for this encounter.  ? ?Konrad Felix Ward, PA-C ?[facility] ?[phone] ? ? ? ? ? ? ? ?

## 2021-07-10 ENCOUNTER — Ambulatory Visit: Payer: Self-pay | Admitting: Surgery

## 2021-07-18 ENCOUNTER — Encounter (HOSPITAL_COMMUNITY): Admission: RE | Payer: Self-pay | Source: Ambulatory Visit

## 2021-07-18 ENCOUNTER — Ambulatory Visit (HOSPITAL_COMMUNITY): Admission: RE | Admit: 2021-07-18 | Payer: PPO | Source: Ambulatory Visit | Admitting: Surgery

## 2021-07-18 SURGERY — LAPAROSCOPIC CHOLECYSTECTOMY
Anesthesia: General

## 2021-07-22 ENCOUNTER — Other Ambulatory Visit: Payer: Self-pay | Admitting: Internal Medicine

## 2021-07-26 ENCOUNTER — Encounter: Payer: Self-pay | Admitting: Podiatry

## 2021-07-26 ENCOUNTER — Ambulatory Visit: Payer: PPO | Admitting: Podiatry

## 2021-07-26 DIAGNOSIS — M2041 Other hammer toe(s) (acquired), right foot: Secondary | ICD-10-CM | POA: Diagnosis not present

## 2021-07-26 DIAGNOSIS — M2042 Other hammer toe(s) (acquired), left foot: Secondary | ICD-10-CM | POA: Diagnosis not present

## 2021-07-26 DIAGNOSIS — D169 Benign neoplasm of bone and articular cartilage, unspecified: Secondary | ICD-10-CM | POA: Diagnosis not present

## 2021-07-28 NOTE — Progress Notes (Signed)
Subjective:  ? ?Patient ID: Cindy [name], female   DOB: 76 y.o.   MRN: [id number]  ? ?HPI ?Patient states her left foot is very sore and the toes did not respond to medication and she is interested for more definitive solution ? ? ?ROS ? ? ?   ?Objective:  ?Physical Exam  ?Neurovascular status intact good digital perfusion noted digits 2 and 3 of the left are found to have a keratotic lesion between them mostly on the head of the proximal phalanx digit to left in the middle phalanx digit 3 left that are painful when pressed with no breakdown of tissue no redness no drainage ? ?   ?Assessment:  ?Hammertoe deformity digit to left exostosis digit 3 left with pain ? ?   ?Plan:  ?H&P reviewed both conditions and discussed treatment options and she is opted for surgical intervention.  I have recommended arthroplasty with possible removal of additional bone digit to left and exostectomy digit 3 and I allowed her to read consent form going over all possible complications as listed and the fact there is no long-term guarantees and alternative treatments.  She is willing to accept risk of surgery and after review signs and is scheduled for outpatient surgery with all instructions given to patient today ?   ? ? ?

## 2021-08-14 ENCOUNTER — Telehealth: Payer: Self-pay | Admitting: Urology

## 2021-08-14 NOTE — Telephone Encounter (Signed)
DOS - 09/10/21 ? ?HAMMERTOE REPAIR 2ND LEFT --- 07615 ?EXOSTECTOMY 3RD LEFT --- 28108 ? ?HTA EFFECTIVE DATE - 04/07/21 ? ?RECEIVED FAX FROM [facility] STATING THAT CPT CODES 18343 AND 73578 HAVE BEEN APPROVED, [place] # [id number], GOOD FROM 09/10/21 - 12/09/21. ?

## 2021-09-04 ENCOUNTER — Telehealth: Payer: Self-pay

## 2021-09-04 NOTE — Telephone Encounter (Signed)
Cindy [name] called to cancel her surgery with Dr. Paulla Dolly on 09/10/2021. She stated she is having issues with her knee and wants to get that taken care of first. She will contact us when she is ready to reschedule. Notified Dr. Paulla Dolly and Caren Griffins with [facility]

## 2021-09-05 DIAGNOSIS — M1712 Unilateral primary osteoarthritis, left knee: Secondary | ICD-10-CM | POA: Diagnosis not present

## 2021-09-16 ENCOUNTER — Encounter: Payer: PPO | Admitting: Podiatry

## 2021-09-30 DIAGNOSIS — M1712 Unilateral primary osteoarthritis, left knee: Secondary | ICD-10-CM | POA: Diagnosis not present

## 2021-09-30 DIAGNOSIS — M25562 Pain in left knee: Secondary | ICD-10-CM | POA: Diagnosis not present

## 2021-10-01 ENCOUNTER — Telehealth: Payer: Self-pay | Admitting: Internal Medicine

## 2021-10-01 NOTE — Telephone Encounter (Signed)
   Name: Cindy [name]  DOB: January 16, 1946  MRN: [id number]  Primary Cardiologist: None  Chart reviewed as part of pre-operative protocol coverage. Because of Cindy [name] past medical history and time since last visit, she will require a follow-up in-office visit in order to better assess preoperative cardiovascular risk.  Pre-op covering staff: - Please schedule appointment and call patient to inform them. If patient already had an upcoming appointment within acceptable timeframe, please add "pre-op clearance" to the appointment notes so provider is aware. - Please contact requesting surgeon's office via preferred method (i.e, phone, fax) to inform them of need for appointment prior to surgery.  She is not on any anticoagulation or antiplatelet therapy.  Sharlene Dory, PA-C  10/01/2021, 11:34 AM

## 2021-10-01 NOTE — Telephone Encounter (Signed)
Message sent to EP scheduler [place] to please reach out to the pt with an appt for pre op clearance.

## 2021-10-10 ENCOUNTER — Encounter: Payer: Self-pay | Admitting: Internal Medicine

## 2021-10-10 ENCOUNTER — Ambulatory Visit: Payer: PPO | Admitting: Internal Medicine

## 2021-10-10 ENCOUNTER — Other Ambulatory Visit: Payer: Self-pay | Admitting: Orthopedic Surgery

## 2021-10-10 VITALS — BP 130/76 | HR 78 | Ht 62.0 in | Wt 160.0 lb

## 2021-10-10 DIAGNOSIS — R55 Syncope and collapse: Secondary | ICD-10-CM | POA: Diagnosis not present

## 2021-10-10 DIAGNOSIS — Z950 Presence of cardiac pacemaker: Secondary | ICD-10-CM

## 2021-10-10 DIAGNOSIS — I471 Supraventricular tachycardia: Secondary | ICD-10-CM | POA: Diagnosis not present

## 2021-10-10 NOTE — Telephone Encounter (Signed)
Per Dr Caryl Comes:   Assessment and  Plan      Vasovagal syncope   Hypertension   Pacemaker - biotronik  The patient's device was interrogated.  The information was reviewed. No changes were made in the programming.        Atrial tachycardia    ACE inhibitor-angioedema   Depression   Preoperative assessment   \No interval syncope.   Blood pressure is well controlled.  We will continue spironolactone and nebivolol and amlodipine now 2.5 mg daily.   Mood is better on the Wellbutrin.   Functional status is quite good, her cardiovascular risks should be acceptable for her arthroscopy surgery

## 2021-10-10 NOTE — Progress Notes (Signed)
Patient ID: Cindy [name], female   DOB: 29-Jul-1945, 76 y.o.   MRN: [id number]      Patient Care Team: Michael Boston, MD as PCP - General (Internal Medicine) Deboraha Sprang, MD as PCP - Electrophysiology (Cardiology) Deboraha Sprang, MD (Cardiology) Jacelyn Pi, MD (Endocrinology) Juanita Craver, MD (Gastroenterology)   HPI  Cindy [name] is a 76 y.o. female Seen in follow-up for vasovagal syncope for which she underwent Biotronik CLS pacing 2012. .    The patient denies chest pain, shortness of breath, nocturnal dyspnea, orthopnea or peripheral edema.  There have been no palpitations, lightheadedness or syncope.  Marland Kitchen  Her mood is better, she had tried a couple different antidepressants but is now on Wellbutrin and feeling better.  She and her husband also got a dog, named Lorre Nick a lot of laughter.[place]on [place] whether she is crazy having got the dog.   DATE TEST EF   8/15 Myoview   73 % Anterior reversible defect   9/15 LHC   65 % Cors normal          Date Cr K Hgb  2/19 0.87 3.6 9.1.  (MCV 90)  6/19  3.1    12/19 0.84 3.8 13.8  10/20 0.85 3.3 12.1  1/21 (7/21) 0.97 (7/21) 3.9 14.5  3/22     5/22 0.88 4.3   3/23 1.18 4.5 15.1   Past Medical History:  Diagnosis Date   Abnormal nuclear stress test 12/15/2013   Anemia    hx iron def, no GI loss indentifed - resolved with diet change     Anxiety    Arthritis    Degenerative disc disease, cervical    Dysrhythmia    GERD (gastroesophageal reflux disease)    GRAVES' DISEASE 2013   I-131 ablation, now post tx hypothyroid state   HYPERTENSION    Impaired glucose tolerance test    Keloid 12/31/2010   incisional    Pacemaker -Biotronik-CLS 08/20/2010   Fainted easily   Postablative hypothyroidism    Qualifier: Diagnosis of  By: Joya Gaskins CMA, [place]     Pre-diabetes    Sciatica    Syncope 08/20/2010   s/p PPM 08/2010 for sxc pause on loop recorder causing same   Unspecified vitamin D deficiency     Past Surgical  History:  Procedure Laterality Date   APPENDECTOMY     blephroplasty     BREAST BIOPSY Left 2001   guided excisional bx (L) breast microcalcification at 12 o'clock- Benign   BUNIONECTOMY Left    CATARACT EXTRACTION  2014   HIATAL HERNIA REPAIR N/A 04/10/2020   Procedure: LAPAROSCOPIC REPAIR OF TYPE 3 HIATAL HERNIA WITH NISSEN FUNDOPLICATION;  Surgeon: Johnathan Hausen, MD;  [facility];  Service: General;  Laterality: N/A;   LEFT HEART CATHETERIZATION WITH CORONARY ANGIOGRAM N/A 12/22/2013   Procedure: LEFT HEART CATHETERIZATION WITH CORONARY ANGIOGRAM;  Surgeon: Sinclair Grooms, MD;  Location: [facility] CATH LAB;  Service: Cardiovascular;  Laterality: N/A;   PACEMAKER INSERTION     Right ankle  06/2010   Trimalleolar fracture   TOTAL SHOULDER ARTHROPLASTY Left 01/27/2019   Procedure: TOTAL SHOULDER ARTHROPLASTY;  Surgeon: Tania Ade, MD;  [facility];  Service: Orthopedics;  Laterality: Left;    Current Outpatient Medications  Medication Sig Dispense Refill   acetaminophen (TYLENOL) 650 MG CR tablet Take 1,300 mg by mouth every 8 (eight) hours as needed for pain.     ALPRAZolam (XANAX) 0.5 MG tablet  Take 0.25 mg by mouth 2 (two) times daily as needed for anxiety.  0   amLODipine (NORVASC) 5 MG tablet TAKE 1 TABLET BY MOUTH EVERY DAY (Patient taking differently: 2.5 mg.) 90 tablet 0   buPROPion (WELLBUTRIN XL) 300 MG 24 hr tablet Take 300 mg by mouth every morning.     Cholecalciferol (VITAMIN D3) 20 MCG (800 UNIT) TABS Take 800 mg by mouth daily.     levothyroxine (SYNTHROID) 125 MCG tablet Take 125 mcg by mouth daily before breakfast.     meloxicam (MOBIC) 15 MG tablet Take 15 mg by mouth daily.     Multiple Vitamins-Minerals (EQ MULTIVITAMINS ADULT GUMMY PO) Take 2 tablets by mouth daily in the afternoon.     nebivolol (BYSTOLIC) 5 MG tablet Take 5 mg by mouth daily.     pantoprazole (PROTONIX) 40 MG tablet Take 40 mg by mouth daily.     spironolactone (ALDACTONE) 50 MG tablet  Take 50 mg by mouth daily.     No current facility-administered medications for this visit.    Allergies  Allergen Reactions   Ace Inhibitors Swelling   Lisinopril Swelling    angiodema   Codeine Nausea And Vomiting   Physical Exam: BP 130/76   Pulse 78   Ht '5\' 2"'$  (1.575 m)   Wt 160 lb (72.6 kg)   SpO2 95%   BMI 29.26 kg/m  Well developed and well nourished in no acute distress HENT normal Neck supple with JVP-flat Clear Device pocket well healed; without hematoma or erythema.  There is no tethering  Regular rate and rhythm,   Abd-soft with active BS No Clubbing cyanosis   edema Skin-warm and dry A & Oriented  Grossly normal sensory and motor function  ECG atrial pacing at 75 with occasional AV pace 28/10/44   Assessment and  Plan     Vasovagal syncope  Hypertension  Pacemaker - biotronik    Atrial tachycardia   ACE inhibitor-angioedema  Depression  Preoperative assessment   \No interval syncope.  Blood pressure is well controlled.  We will continue spironolactone and nebivolol and amlodipine now 2.5 mg daily.  Mood is better on the Wellbutrin.  Functional status is quite good, her cardiovascular risks should be acceptable for her arthroscopy surgery

## 2021-10-10 NOTE — Patient Instructions (Signed)

## 2021-10-14 ENCOUNTER — Encounter: Payer: PPO | Admitting: Physician Assistant

## 2021-10-15 ENCOUNTER — Encounter: Payer: Self-pay | Admitting: Internal Medicine

## 2021-10-15 DIAGNOSIS — Z1231 Encounter for screening mammogram for malignant neoplasm of breast: Secondary | ICD-10-CM | POA: Diagnosis not present

## 2021-10-15 NOTE — Progress Notes (Signed)
PERIOPERATIVE PRESCRIPTION FOR IMPLANTED CARDIAC DEVICE PROGRAMMING  Patient Information: Name:  Cindy [name]  DOB:  [birth year]  MRN:  [id number]    Planned Procedure: Left knee arthroscopy  Surgeon: Dorna Leitz  Date of Procedure: 10-28-21  Cautery will be used.  Position during surgery: N/A   Please send documentation back to:  [facility] (Fax # [phone])   Device Information:  Clinic EP Physician:  Virl Axe, MD   Device Type:  Pacemaker Manufacturer and Phone #:  Biotronik: [phone] Pacemaker Dependent?:  Unknown Date of Last Device Check:  10/10/2021 Normal Device Function?:  Yes.    Electrophysiologist's Recommendations:  Have magnet available. Provide continuous ECG monitoring when magnet is used or reprogramming is to be performed.  Procedure should not interfere with device function.  No device programming or magnet placement needed.  Per Device Clinic Standing Orders, LAMYIA CDEBACA, RN  2:27 PM 10/15/2021

## 2021-10-15 NOTE — Patient Instructions (Addendum)
DUE TO COVID-19 ONLY TWO VISITORS  (aged 76 and older)  ARE ALLOWED TO COME WITH YOU AND STAY IN THE WAITING ROOM ONLY DURING PRE OP AND PROCEDURE.   **NO VISITORS ARE ALLOWED IN THE SHORT STAY AREA OR RECOVERY ROOM!!**    Your procedure is scheduled on: Monday 10-28-21   Report to [facility] Main Entrance    Report to admitting at     1030 AM   Call this number if you have problems the morning of surgery [phone]   Do not eat food :After Midnight.the night prior to surgery.   After Midnight you may have the following liquids until __930 ____ AM/ DAY OF SURGERY  Then NOTHING by mouth  Water Black Coffee (sugar ok, NO MILK/CREAM OR CREAMERS)  Tea (sugar ok, NO MILK/CREAM OR CREAMERS) regular and decaf                             Plain Jell-O (NO RED)                                           Fruit ices (not with fruit pulp, NO RED)                                     Popsicles (NO RED)                                                                  Juice: apple, WHITE grape, WHITE cranberry Sports drinks like Gatorade (NO RED) Clear broth(vegetable,chicken,beef)                       The day of surgery:  Drink ONE (1) Pre-Surgery  G2 at  9:30 AM the morning of surgery. Drink in one sitting. Do not sip.  This drink was given to you during your hospital  pre-op appointment visit. Nothing else to drink after completing the Pre-Surgery G2.          If you have questions, please contact your surgeon's office.   FOLLOW ANY ADDITIONAL PRE OP INSTRUCTIONS YOU RECEIVED FROM YOUR SURGEON'S OFFICE!!!     Oral Hygiene is also important to reduce your risk of infection.                                    Remember - BRUSH YOUR TEETH THE MORNING OF SURGERY WITH YOUR REGULAR TOOTHPASTE   Take these medicines the morning of surgery with A SIP OF WATER: nebivolol, levothyroxine, bupropion, xanax if needed, tylenol if needed                               You may not have  any metal on your body including hair pins, jewelry, and body piercing             Do not wear make-up, lotions, powders, perfumes  or deodorant  Do not wear nail polish including gel and S&S, artificial/acrylic nails, or any other type of covering on natural nails including finger and toenails. If you have artificial nails, gel coating, etc. that needs to be removed by a nail salon please have this removed prior to surgery or surgery may need to be canceled/ delayed if the surgeon/ anesthesia feels like they are unable to be safely monitored.   Do not shave  48 hours prior to surgery.                Do not bring valuables to the hospital. [place].   DO NOT [place]. PHARMACY WILL DISPENSE MEDICATIONS LISTED ON YOUR MEDICATION LIST TO YOU DURING YOUR ADMISSION [place]!    Patients discharged on the day of surgery will not be allowed to drive home.  Someone NEEDS to stay with you for the first 24 hours after anesthesia.   Special Instructions: Bring a copy of your healthcare power of attorney and living will documents         the day of surgery if you haven't scanned them before.              Please read over the following fact sheets you were given: IF YOU HAVE QUESTIONS ABOUT YOUR PRE-OP INSTRUCTIONS PLEASE CALL [phone]     [facility] - Preparing for Surgery Before surgery, you can play an important role.  Because skin is not sterile, your skin needs to be as free of germs as possible.  You can reduce the number of germs on your skin by washing with CHG (chlorahexidine gluconate) soap before surgery.  CHG is an antiseptic cleaner which kills germs and bonds with the skin to continue killing germs even after washing. Please DO NOT use if you have an allergy to CHG or antibacterial soaps.  If your skin becomes reddened/irritated stop using the CHG and inform your nurse when you arrive at Short Stay. Do not  shave (including legs and underarms) for at least 48 hours prior to the first CHG shower.  You may shave your face/neck. Please follow these instructions carefully:  1.  Shower with CHG Soap the night before surgery and the  morning of Surgery.  2.  If you choose to wash your hair, wash your hair first as usual with your  normal  shampoo.  3.  After you shampoo, rinse your hair and body thoroughly to remove the  shampoo.            4.  Use CHG as you would any other liquid soap.  You can apply chg directly  to the skin and wash                       Gently with a scrungie or clean washcloth.  5.  Apply the CHG Soap to your body ONLY FROM THE NECK DOWN.   Do not use on face/ open                           Wound or open sores. Avoid contact with eyes, ears mouth and genitals (private parts).                       Wash face,  Genitals (private parts) with your normal soap.  6.  Wash thoroughly, paying special attention to the area where your surgery  will be performed.  7.  Thoroughly rinse your body with warm water from the neck down.  8.  DO NOT shower/wash with your normal soap after using and rinsing off  the CHG Soap.                9.  Pat yourself dry with a clean towel.            10.  Wear clean pajamas.            11.  Place clean sheets on your bed the night of your first shower and do not  sleep with pets. Day of Surgery : Do not apply any lotions/deodorants the morning of surgery.  Please wear clean clothes to the hospital/surgery center.  FAILURE TO FOLLOW THESE INSTRUCTIONS MAY RESULT IN THE CANCELLATION OF YOUR SURGERY PATIENT SIGNATURE_________________________________  NURSE SIGNATURE__________________________________  ________________________________________________________________________    Cindy [name]    An incentive spirometer is a tool that can help keep your lungs clear and active. This tool measures how well you are filling your lungs with each  breath. Taking long deep breaths may help reverse or decrease the chance of developing breathing (pulmonary) problems (especially infection) following: A long period of time when you are unable to move or be active. BEFORE THE PROCEDURE  If the spirometer includes an indicator to show your best effort, your nurse or respiratory therapist will set it to a desired goal. If possible, sit up straight or lean slightly forward. Try not to slouch. Hold the incentive spirometer in an upright position. INSTRUCTIONS FOR USE  Sit on the edge of your bed if possible, or sit up as far as you can in bed or on a chair. Hold the incentive spirometer in an upright position. Breathe out normally. Place the mouthpiece in your mouth and seal your lips tightly around it. Breathe in slowly and as deeply as possible, raising the piston or the ball toward the top of the column. Hold your breath for 3-5 seconds or for as long as possible. Allow the piston or ball to fall to the bottom of the column. Remove the mouthpiece from your mouth and breathe out normally. Rest for a few seconds and repeat Steps 1 through 7 at least 10 times every 1-2 hours when you are awake. Take your time and take a few normal breaths between deep breaths. The spirometer may include an indicator to show your best effort. Use the indicator as a goal to work toward during each repetition. After each set of 10 deep breaths, practice coughing to be sure your lungs are clear. If you have an incision (the cut made at the time of surgery), support your incision when coughing by placing a pillow or rolled up towels firmly against it. Once you are able to get out of bed, walk around indoors and cough well. You may stop using the incentive spirometer when instructed by your caregiver.  RISKS AND COMPLICATIONS Take your time so you do not get dizzy or light-headed. If you are in pain, you may need to take or ask for pain medication before doing incentive  spirometry. It is harder to take a deep breath if you are having pain. AFTER USE Rest and breathe slowly and easily. It can be helpful to keep track of a log of your progress. Your caregiver can provide you with a simple table to help with this.  If you are using the spirometer at home, follow these instructions: [place] IF:  You are having difficultly using the spirometer. You have trouble using the spirometer as often as instructed. Your pain medication is not giving enough relief while using the spirometer. You develop fever of 100.5 F (38.1 C) or higher.                                                                                                    SEEK IMMEDIATE MEDICAL CARE IF:  You cough up bloody sputum that had not been present before. You develop fever of 102 F (38.9 C) or greater. You develop worsening pain at or near the incision site. MAKE SURE YOU:  Understand these instructions. Will watch your condition. Will get help right away if you are not doing well or get worse. Document Released: 08/04/2006 Document Revised: 06/16/2011 Document Reviewed: 10/05/2006 [facility] Patient Information 2014 [place], [place].

## 2021-10-16 ENCOUNTER — Encounter (HOSPITAL_COMMUNITY): Payer: Self-pay

## 2021-10-16 ENCOUNTER — Other Ambulatory Visit: Payer: Self-pay

## 2021-10-16 ENCOUNTER — Encounter (HOSPITAL_COMMUNITY)
Admission: RE | Admit: 2021-10-16 | Discharge: 2021-10-16 | Disposition: A | Discharge status: Home / Self Care | Payer: PPO | Source: Ambulatory Visit | Attending: Orthopedic Surgery | Admitting: Orthopedic Surgery

## 2021-10-16 DIAGNOSIS — R7303 Prediabetes: Secondary | ICD-10-CM | POA: Insufficient documentation

## 2021-10-16 DIAGNOSIS — I10 Essential (primary) hypertension: Secondary | ICD-10-CM | POA: Diagnosis not present

## 2021-10-16 DIAGNOSIS — Z01812 Encounter for preprocedural laboratory examination: Secondary | ICD-10-CM | POA: Diagnosis not present

## 2021-10-16 DIAGNOSIS — Z87898 Personal history of other specified conditions: Secondary | ICD-10-CM | POA: Insufficient documentation

## 2021-10-16 HISTORY — DX: Personal history of other diseases of the digestive system: Z87.19

## 2021-10-16 HISTORY — DX: Depression, unspecified: F32.A

## 2021-10-16 LAB — BASIC METABOLIC PANEL
Anion gap: 7 (ref 5–15)
BUN: 27 mg/dL — ABNORMAL HIGH (ref 8–23)
CO2: 26 mmol/L (ref 22–32)
Calcium: 9.8 mg/dL (ref 8.9–10.3)
Chloride: 109 mmol/L (ref 98–111)
Creatinine, Ser: 1.15 mg/dL — ABNORMAL HIGH (ref 0.44–1.00)
GFR, Estimated: 49 mL/min — ABNORMAL LOW (ref >60)
Glucose, Bld: 100 mg/dL — ABNORMAL HIGH (ref 70–99)
Potassium: 4.5 mmol/L (ref 3.5–5.1)
Sodium: 142 mmol/L (ref 135–145)

## 2021-10-16 LAB — GLUCOSE, CAPILLARY: Glucose-Capillary: 102 mg/dL — ABNORMAL HIGH (ref 70–99)

## 2021-10-16 LAB — CBC
HCT: 43 % (ref 36.0–46.0)
Hemoglobin: 14.2 g/dL (ref 12.0–15.0)
MCH: 31.2 pg (ref 26.0–34.0)
MCHC: 33 g/dL (ref 30.0–36.0)
MCV: 94.5 fL (ref 80.0–100.0)
Platelets: 254 10*3/uL (ref 150–400)
RBC: 4.55 MIL/uL (ref 3.87–5.11)
RDW: 12.1 % (ref 11.5–15.5)
WBC: 5.6 10*3/uL (ref 4.0–10.5)
nRBC: 0 % (ref 0.0–0.2)

## 2021-10-16 NOTE — Progress Notes (Signed)
COVID Vaccine received:  '[]'$  No '[x]'$  Yes  Date of any COVID positive Test in last 90 days:  none  PCP - Cristie Hem, MD Cardiologist - Jolyn Nap, MD      Chest x-ray [phone]  Epic EKG -  10-10-21  EPIC Stress Test - 2051  Epic ECHO -  Cardiac Cath - 2015  Pacemaker/ICD device last checked: Date: 10-10-21      EP clearance on chart/ Epic note.   Biotronik PPM  Spinal Cord Stimulator:'[x]'$  No '[]'$  Yes   Other Implants:   History of Sleep Apnea? '[x]'$  No '[]'$  Yes  '[]'$  unknown Sleep Study Date:  none  Does the patient monitor blood sugar? '[]'$  No '[]'$  Yes  '[x]'$  N/A  Blood Thinner Instructions: none  Activity level:  Can go up a flight of stairs and perform activities of daily living without stopping and  without symptoms of chest pain or shortness of breath.'[]'$  No '[x]'$  Yes  '[]'$  N/A  Able to do exercises without symptoms '[]'$  No '[x]'$  Yes  '[]'$  N/A  Patient able to complete ADLs without assistance '[]'$  No '[x]'$  Yes  '[]'$  N/A    Comments:   Anesthesia review: Has PPM (syncope), HTN, Pre-DM  Patient denies shortness of breath, fever, cough and chest pain at PAT appointment.Patient verbalized understanding and agreement to the Pre-Surgical Instructions that were given to them at this PAT appointment. Patient was also educated of the need to review these PAT instructions again prior to his/her surgery.I reviewed the appropriate phone numbers to call if they have any and questions or concerns.

## 2021-10-17 DIAGNOSIS — Z79899 Other long term (current) drug therapy: Secondary | ICD-10-CM | POA: Diagnosis not present

## 2021-10-17 DIAGNOSIS — R7989 Other specified abnormal findings of blood chemistry: Secondary | ICD-10-CM | POA: Diagnosis not present

## 2021-10-17 DIAGNOSIS — F419 Anxiety disorder, unspecified: Secondary | ICD-10-CM | POA: Diagnosis not present

## 2021-10-17 DIAGNOSIS — E785 Hyperlipidemia, unspecified: Secondary | ICD-10-CM | POA: Diagnosis not present

## 2021-10-17 LAB — HEMOGLOBIN A1C
Hgb A1c MFr Bld: 5.4 % (ref 4.8–5.6)
Mean Plasma Glucose: 108.28 mg/dL

## 2021-10-24 DIAGNOSIS — F339 Major depressive disorder, recurrent, unspecified: Secondary | ICD-10-CM | POA: Diagnosis not present

## 2021-10-24 DIAGNOSIS — Z1331 Encounter for screening for depression: Secondary | ICD-10-CM | POA: Diagnosis not present

## 2021-10-24 DIAGNOSIS — Z1389 Encounter for screening for other disorder: Secondary | ICD-10-CM | POA: Diagnosis not present

## 2021-10-24 DIAGNOSIS — R7303 Prediabetes: Secondary | ICD-10-CM | POA: Diagnosis not present

## 2021-10-24 DIAGNOSIS — E785 Hyperlipidemia, unspecified: Secondary | ICD-10-CM | POA: Diagnosis not present

## 2021-10-24 DIAGNOSIS — I129 Hypertensive chronic kidney disease with stage 1 through stage 4 chronic kidney disease, or unspecified chronic kidney disease: Secondary | ICD-10-CM | POA: Diagnosis not present

## 2021-10-24 DIAGNOSIS — M8580 Other specified disorders of bone density and structure, unspecified site: Secondary | ICD-10-CM | POA: Diagnosis not present

## 2021-10-24 DIAGNOSIS — Z0000 Encounter for general adult medical examination without abnormal findings: Secondary | ICD-10-CM | POA: Diagnosis not present

## 2021-10-24 DIAGNOSIS — I471 Supraventricular tachycardia: Secondary | ICD-10-CM | POA: Diagnosis not present

## 2021-10-24 DIAGNOSIS — N1831 Chronic kidney disease, stage 3a: Secondary | ICD-10-CM | POA: Diagnosis not present

## 2021-10-24 NOTE — Progress Notes (Signed)
Updated date of surgery: 10/28/21  Updated time of arrival: 0730AM  Drink G2 at 42 AM  Patient will be discharged from hospital and monitored at home for 24 hours by:  Patient denies any changes in allergies, medications, medical history since pre op appointment on:  Pre op instructions reviewed, follow up questions addressed and patient verbalized understanding at this time.

## 2021-10-28 ENCOUNTER — Ambulatory Visit (HOSPITAL_COMMUNITY)
Admission: RE | Admit: 2021-10-28 | Discharge: 2021-10-28 | Disposition: A | Discharge status: Home / Self Care | Payer: PPO | Attending: Orthopedic Surgery | Admitting: Orthopedic Surgery

## 2021-10-28 ENCOUNTER — Ambulatory Visit (HOSPITAL_BASED_OUTPATIENT_CLINIC_OR_DEPARTMENT_OTHER): Payer: PPO | Admitting: Certified Registered"

## 2021-10-28 ENCOUNTER — Ambulatory Visit (HOSPITAL_COMMUNITY): Payer: PPO | Admitting: Certified Registered"

## 2021-10-28 ENCOUNTER — Encounter (HOSPITAL_COMMUNITY): Payer: Self-pay | Admitting: Orthopedic Surgery

## 2021-10-28 ENCOUNTER — Other Ambulatory Visit: Payer: Self-pay

## 2021-10-28 ENCOUNTER — Encounter (HOSPITAL_COMMUNITY)
Admission: RE | Disposition: A | Discharge status: Home / Self Care | Payer: Self-pay | Source: Home / Self Care | Attending: Orthopedic Surgery

## 2021-10-28 DIAGNOSIS — I10 Essential (primary) hypertension: Secondary | ICD-10-CM | POA: Insufficient documentation

## 2021-10-28 DIAGNOSIS — S83242A Other tear of medial meniscus, current injury, left knee, initial encounter: Secondary | ICD-10-CM

## 2021-10-28 DIAGNOSIS — F419 Anxiety disorder, unspecified: Secondary | ICD-10-CM | POA: Diagnosis not present

## 2021-10-28 DIAGNOSIS — Z950 Presence of cardiac pacemaker: Secondary | ICD-10-CM | POA: Diagnosis not present

## 2021-10-28 DIAGNOSIS — Z87898 Personal history of other specified conditions: Secondary | ICD-10-CM

## 2021-10-28 DIAGNOSIS — X58XXXA Exposure to other specified factors, initial encounter: Secondary | ICD-10-CM | POA: Insufficient documentation

## 2021-10-28 DIAGNOSIS — Z87891 Personal history of nicotine dependence: Secondary | ICD-10-CM | POA: Insufficient documentation

## 2021-10-28 DIAGNOSIS — Z79899 Other long term (current) drug therapy: Secondary | ICD-10-CM | POA: Insufficient documentation

## 2021-10-28 DIAGNOSIS — M2242 Chondromalacia patellae, left knee: Secondary | ICD-10-CM | POA: Diagnosis not present

## 2021-10-28 DIAGNOSIS — M6752 Plica syndrome, left knee: Secondary | ICD-10-CM | POA: Diagnosis not present

## 2021-10-28 DIAGNOSIS — F32A Depression, unspecified: Secondary | ICD-10-CM | POA: Insufficient documentation

## 2021-10-28 DIAGNOSIS — I499 Cardiac arrhythmia, unspecified: Secondary | ICD-10-CM | POA: Insufficient documentation

## 2021-10-28 DIAGNOSIS — E890 Postprocedural hypothyroidism: Secondary | ICD-10-CM | POA: Insufficient documentation

## 2021-10-28 DIAGNOSIS — K219 Gastro-esophageal reflux disease without esophagitis: Secondary | ICD-10-CM | POA: Insufficient documentation

## 2021-10-28 DIAGNOSIS — M23304 Other meniscus derangements, unspecified medial meniscus, left knee: Secondary | ICD-10-CM | POA: Diagnosis not present

## 2021-10-28 DIAGNOSIS — M94262 Chondromalacia, left knee: Secondary | ICD-10-CM | POA: Diagnosis not present

## 2021-10-28 DIAGNOSIS — F418 Other specified anxiety disorders: Secondary | ICD-10-CM | POA: Diagnosis not present

## 2021-10-28 HISTORY — PX: KNEE ARTHROSCOPY: SHX127

## 2021-10-28 SURGERY — ARTHROSCOPY, KNEE
Anesthesia: General | Site: Knee | Laterality: Left

## 2021-10-28 MED ORDER — PROPOFOL 10 MG/ML IV BOLUS
INTRAVENOUS | Status: AC
  Filled 2021-10-28: qty 20

## 2021-10-28 MED ORDER — AMLODIPINE BESYLATE 2.5 MG PO TABS: 2.5000 mg | ORAL_TABLET | Freq: Every day | ORAL | Status: DC

## 2021-10-28 MED ORDER — METOCLOPRAMIDE HCL 5 MG/ML IJ SOLN: 5.0000 mg | Freq: Three times a day (TID) | INTRAMUSCULAR | Status: DC | PRN

## 2021-10-28 MED ORDER — EPINEPHRINE PF 1 MG/ML IJ SOLN
INTRAMUSCULAR | Status: AC
  Filled 2021-10-28: qty 1

## 2021-10-28 MED ORDER — ACETAMINOPHEN 160 MG/5ML PO SOLN: 1000.0000 mg | Freq: Once | ORAL | Status: DC | PRN

## 2021-10-28 MED ORDER — HYDROCODONE-ACETAMINOPHEN 5-325 MG PO TABS
1.0000 | ORAL_TABLET | ORAL | 0 refills | Status: DC | PRN
Start: 2021-10-28 — End: 2022-03-18

## 2021-10-28 MED ORDER — LACTATED RINGERS IV SOLN: INTRAVENOUS | Status: DC

## 2021-10-28 MED ORDER — SPIRONOLACTONE 25 MG PO TABS: 50.0000 mg | ORAL_TABLET | Freq: Every day | ORAL | Status: DC

## 2021-10-28 MED ORDER — OXYCODONE HCL 5 MG PO TABS: 5.0000 mg | ORAL_TABLET | Freq: Once | ORAL | Status: DC | PRN

## 2021-10-28 MED ORDER — FENTANYL CITRATE PF 50 MCG/ML IJ SOSY
PREFILLED_SYRINGE | INTRAMUSCULAR | Status: AC
  Filled 2021-10-28: qty 2

## 2021-10-28 MED ORDER — HYDROCODONE-ACETAMINOPHEN 5-325 MG PO TABS: 1.0000 | ORAL_TABLET | ORAL | Status: DC | PRN

## 2021-10-28 MED ORDER — OXYCODONE HCL 5 MG/5ML PO SOLN: 5.0000 mg | Freq: Once | ORAL | Status: DC | PRN

## 2021-10-28 MED ORDER — FENTANYL CITRATE (PF) 100 MCG/2ML IJ SOLN
INTRAMUSCULAR | Status: AC
  Filled 2021-10-28: qty 2

## 2021-10-28 MED ORDER — BUPROPION HCL ER (XL) 300 MG PO TB24: 300.0000 mg | ORAL_TABLET | Freq: Every morning | ORAL | Status: DC

## 2021-10-28 MED ORDER — ACETAMINOPHEN 10 MG/ML IV SOLN
INTRAVENOUS | Status: DC | PRN
  Administered 2021-10-28: 1000 mg via INTRAVENOUS

## 2021-10-28 MED ORDER — ACETAMINOPHEN 500 MG PO TABS: 500.0000 mg | ORAL_TABLET | Freq: Four times a day (QID) | ORAL | Status: DC

## 2021-10-28 MED ORDER — MIDAZOLAM HCL 2 MG/2ML IJ SOLN
INTRAMUSCULAR | Status: AC
  Filled 2021-10-28: qty 2

## 2021-10-28 MED ORDER — ONDANSETRON HCL 4 MG/2ML IJ SOLN
INTRAMUSCULAR | Status: AC
  Filled 2021-10-28: qty 2

## 2021-10-28 MED ORDER — HYDROCODONE-ACETAMINOPHEN 7.5-325 MG PO TABS: 1.0000 | ORAL_TABLET | ORAL | Status: DC | PRN

## 2021-10-28 MED ORDER — CHLORHEXIDINE GLUCONATE 0.12 % MT SOLN
15.0000 mL | Freq: Once | OROMUCOSAL | Status: AC
Start: 2021-10-28 — End: 2021-10-28
  Administered 2021-10-28: 15 mL via OROMUCOSAL

## 2021-10-28 MED ORDER — PHENYLEPHRINE HCL-NACL 20-0.9 MG/250ML-% IV SOLN
INTRAVENOUS | Status: DC | PRN
  Administered 2021-10-28: 30 ug/min via INTRAVENOUS

## 2021-10-28 MED ORDER — ORAL CARE MOUTH RINSE: 15.0000 mL | Freq: Once | OROMUCOSAL | Status: AC

## 2021-10-28 MED ORDER — DEXAMETHASONE SODIUM PHOSPHATE 10 MG/ML IJ SOLN
INTRAMUSCULAR | Status: AC
  Filled 2021-10-28: qty 1

## 2021-10-28 MED ORDER — FENTANYL CITRATE (PF) 250 MCG/5ML IJ SOLN
INTRAMUSCULAR | Status: DC | PRN
  Administered 2021-10-28: 50 ug via INTRAVENOUS
  Administered 2021-10-28 (×2): 25 ug via INTRAVENOUS

## 2021-10-28 MED ORDER — CEFAZOLIN SODIUM-DEXTROSE 2-4 GM/100ML-% IV SOLN
2.0000 g | INTRAVENOUS | Status: AC
  Administered 2021-10-28: 2 g via INTRAVENOUS
  Filled 2021-10-28: qty 100

## 2021-10-28 MED ORDER — ONDANSETRON HCL 4 MG PO TABS: 4.0000 mg | ORAL_TABLET | Freq: Four times a day (QID) | ORAL | Status: DC | PRN

## 2021-10-28 MED ORDER — BUPIVACAINE HCL (PF) 0.5 % IJ SOLN
INTRAMUSCULAR | Status: AC
  Filled 2021-10-28: qty 30

## 2021-10-28 MED ORDER — PROPOFOL 10 MG/ML IV BOLUS
INTRAVENOUS | Status: DC | PRN
  Administered 2021-10-28: 20 mg via INTRAVENOUS
  Administered 2021-10-28: 160 mg via INTRAVENOUS
  Administered 2021-10-28: 20 mg via INTRAVENOUS

## 2021-10-28 MED ORDER — FENTANYL CITRATE PF 50 MCG/ML IJ SOSY: 25.0000 ug | PREFILLED_SYRINGE | INTRAMUSCULAR | Status: DC | PRN

## 2021-10-28 MED ORDER — ACETAMINOPHEN 10 MG/ML IV SOLN: 1000.0000 mg | Freq: Once | INTRAVENOUS | Status: DC | PRN

## 2021-10-28 MED ORDER — ACETAMINOPHEN 10 MG/ML IV SOLN
INTRAVENOUS | Status: AC
  Filled 2021-10-28: qty 100

## 2021-10-28 MED ORDER — METOCLOPRAMIDE HCL 5 MG PO TABS: 5.0000 mg | ORAL_TABLET | Freq: Three times a day (TID) | ORAL | Status: DC | PRN

## 2021-10-28 MED ORDER — ALPRAZOLAM 0.25 MG PO TABS: 0.2500 mg | ORAL_TABLET | Freq: Two times a day (BID) | ORAL | Status: DC | PRN

## 2021-10-28 MED ORDER — NEBIVOLOL HCL 5 MG PO TABS: 5.0000 mg | ORAL_TABLET | Freq: Every day | ORAL | Status: DC

## 2021-10-28 MED ORDER — ONDANSETRON HCL 4 MG/2ML IJ SOLN
INTRAMUSCULAR | Status: DC | PRN
  Administered 2021-10-28: 4 mg via INTRAVENOUS

## 2021-10-28 MED ORDER — DEXAMETHASONE SODIUM PHOSPHATE 10 MG/ML IJ SOLN
INTRAMUSCULAR | Status: DC | PRN
  Administered 2021-10-28: 8 mg via INTRAVENOUS

## 2021-10-28 MED ORDER — BUPIVACAINE HCL (PF) 0.5 % IJ SOLN
INTRAMUSCULAR | Status: DC | PRN
  Administered 2021-10-28: 20 mL

## 2021-10-28 MED ORDER — ACETAMINOPHEN 500 MG PO TABS: 1000.0000 mg | ORAL_TABLET | Freq: Once | ORAL | Status: DC | PRN

## 2021-10-28 MED ORDER — ACETAMINOPHEN 325 MG PO TABS: 325.0000 mg | ORAL_TABLET | Freq: Four times a day (QID) | ORAL | Status: DC | PRN

## 2021-10-28 MED ORDER — LEVOTHYROXINE SODIUM 125 MCG PO TABS: 125.0000 ug | ORAL_TABLET | Freq: Every day | ORAL | Status: DC

## 2021-10-28 MED ORDER — ONDANSETRON HCL 4 MG/2ML IJ SOLN: 4.0000 mg | Freq: Four times a day (QID) | INTRAMUSCULAR | Status: DC | PRN

## 2021-10-28 MED ORDER — LIDOCAINE 2% (20 MG/ML) 5 ML SYRINGE
INTRAMUSCULAR | Status: DC | PRN
  Administered 2021-10-28: 60 mg via INTRAVENOUS

## 2021-10-28 SURGICAL SUPPLY — 34 items
BAG COUNTER SPONGE SURGICOUNT (BAG) IMPLANT
BAG SPNG CNTER NS LX DISP (BAG)
BLADE EXCALIBUR 4.0X13 (MISCELLANEOUS) IMPLANT
BNDG ELASTIC 6X5.8 VLCR STR LF (GAUZE/BANDAGES/DRESSINGS) ×3 IMPLANT
BOOTIES KNEE HIGH SLOAN (MISCELLANEOUS) ×6 IMPLANT
DISSECTOR 4.0MM X 13CM (MISCELLANEOUS) IMPLANT
DRAPE U-SHAPE 47X51 STRL (DRAPES) ×3 IMPLANT
DRSG EMULSION OIL 3X3 NADH (GAUZE/BANDAGES/DRESSINGS) ×3 IMPLANT
DRSG PAD ABDOMINAL 8X10 ST (GAUZE/BANDAGES/DRESSINGS) ×2 IMPLANT
DURAPREP 26ML APPLICATOR (WOUND CARE) ×3 IMPLANT
DW OUTFLOW CASSETTE/TUBE SET (MISCELLANEOUS) ×3 IMPLANT
FILTER STRAW (MISCELLANEOUS) ×3 IMPLANT
GAUZE 4X4 16PLY ~~LOC~~+RFID DBL (SPONGE) ×3 IMPLANT
GAUZE SPONGE 4X4 12PLY STRL (GAUZE/BANDAGES/DRESSINGS) ×3 IMPLANT
GAUZE XEROFORM 1X8 LF (GAUZE/BANDAGES/DRESSINGS) ×1 IMPLANT
GLOVE BIOGEL PI IND STRL 8 (GLOVE) ×4 IMPLANT
GLOVE BIOGEL PI INDICATOR 8 (GLOVE) ×2
GLOVE ECLIPSE 7.5 STRL STRAW (GLOVE) ×6 IMPLANT
GOWN STRL REUS W/ TWL XL LVL3 (GOWN DISPOSABLE) ×4 IMPLANT
GOWN STRL REUS W/TWL XL LVL3 (GOWN DISPOSABLE) ×4
KIT BASIN OR (CUSTOM PROCEDURE TRAY) ×3 IMPLANT
MANIFOLD NEPTUNE II (INSTRUMENTS) ×3 IMPLANT
NDL SAFETY ECLIPSE 18X1.5 (NEEDLE) ×2 IMPLANT
NEEDLE HYPO 18GX1.5 SHARP (NEEDLE) ×2
PACK ARTHROSCOPY WL (CUSTOM PROCEDURE TRAY) ×3 IMPLANT
PAD MASON LEG HOLDER (PIN) IMPLANT
PADDING CAST ABS 6INX4YD NS (CAST SUPPLIES) ×1
PADDING CAST ABS COTTON 6X4 NS (CAST SUPPLIES) IMPLANT
PADDING CAST COTTON 6X4 STRL (CAST SUPPLIES) ×3 IMPLANT
PENCIL SMOKE EVACUATOR (MISCELLANEOUS) IMPLANT
PORT APPOLLO RF 90DEGREE MULTI (SURGICAL WAND) IMPLANT
SUT ETHILON 4 0 PS 2 18 (SUTURE) IMPLANT
TUBING ARTHROSCOPY IRRIG 16FT (MISCELLANEOUS) ×3 IMPLANT
WRAP KNEE MAXI GEL POST OP (GAUZE/BANDAGES/DRESSINGS) ×3 IMPLANT

## 2021-10-28 NOTE — Transfer of Care (Signed)
Immediate Anesthesia Transfer of Care Note  Patient: Cindy [name]  Procedure(s) Performed: ARTHROSCOPY KNEE (Left: Knee)  Patient Location: PACU  Anesthesia Type:General  Level of Consciousness: awake, alert , oriented and patient cooperative  Airway & Oxygen Therapy: Patient Spontanous Breathing and Patient connected to face mask oxygen  Post-op Assessment: Report given to RN and Post -op Vital signs reviewed and stable  Post vital signs: Reviewed and stable  Last Vitals:  Vitals Value Taken Time  BP    Temp    Pulse    Resp    SpO2      Last Pain:  Vitals:   10/28/21 0806  TempSrc:   PainSc: 2       Patients Stated Pain Goal: 3 (42/70/62 3762)  Complications: No notable events documented.

## 2021-10-28 NOTE — Op Note (Signed)
[name], [name] MEDICAL RECORD NO: [id number] ACCOUNT NO: [id number] DATE OF BIRTH: [birth year] FACILITY: [facility] LOCATION: [facility] PHYSICIAN: Alta Corning, MD  Operative Report   DATE OF PROCEDURE: 10/28/2021  PREOPERATIVE DIAGNOSIS:  Medial meniscal tear.  POSTOPERATIVE DIAGNOSES:   1.  Medial meniscal tear. 2.  Chondromalacia of medial compartment. 3.  Chondromalacia of patellofemoral compartment. 4.  Medial shelf plica.  PROCEDURE: 1.  Left knee arthroscopy with partial medial meniscectomy. 2. Abrasion arthroplasty, medial compartment down to bleeding bone. 3.  Abrasion chondroplasty of patellofemoral compartment. 4.  Medial shelf plica excision.  SURGEON:  Alta Corning, MD  ASSISTANT:  Joanell Rising, PA-C, he was present for the entire case, and assisted by retraction of the leg, manipulation of tissues and closing to minimize OR time.  BRIEF HISTORY:  The patient is a 76 year old female with a long history of significant complaints of left knee pain.  She has been treated conservatively for a prolonged period of time.  She failed injection therapy and activity modification.  She had  x-rays showing significant narrowing and perhaps a bone-on-bone change.  We talked about treatment options including the possibility of total knee replacement and she really did not wish to proceed with that.  We ultimately had discussions about the  possibility of arthroscopy and we did proceed with that.  She was brought to the operating of this procedure.  DESCRIPTION OF PROCEDURE:  The patient was brought to the operating room.  After adequate anesthesia was obtained with general anesthetic, the patient was placed supine to the operating table.  Left leg was prepped and draped in usual sterile fashion.   Following this, routine arthroscopic examination the knee revealed there was obvious significant chondromalacia in the medial compartment as well as a significant medial meniscal  tear.  We used a probe and I could see the medial meniscus was just  flipping in and out of the joint.  There was also a posterior leaflet that had flipped up posteriorly.  We used a straight biting forceps and a suction shaver to contour down the torn portion of the meniscus back to a smooth and stable rim.  There was  grade III and grade IV change on the medial tibial plateau and some fragment, taking pieces of cartilage more peripherally, which were debrided.  Attention was turned to the medial femoral condyle, which was debrided down to bleeding bone and a small  number of areas.  At this point, there were no loose and fragmented pieces of cartilage and the meniscus had been adequately debrided.  ACL was normal.  Lateral compartment showed normal lateral meniscus and lateral femoral condyle.  Patellofemoral  compartment had some grade II and grade III changes, which were debrided back to a smooth and stable rim.  The large medial plica was debrided with a suction shaver back to the rim of the joint line.  At this point, the knee was copiously irrigated and  suctioned dry.  The arthroscopic portals were closed with a bandage.  Sterile compressive dressing was applied.  The patient was taken to recovery and was noted to be in satisfactory condition.  A 20 mL of 0.5% Marcaine was instilled in the knee for  postoperative anesthesia.   PUS D: 10/28/2021 10:46:02 am T: 10/28/2021 12:49:00 pm  JOB: [id number]/ [id number]

## 2021-10-28 NOTE — Brief Op Note (Signed)
10/28/2021  10:41 AM  PATIENT:  [name]  76 y.o. female  PRE-OPERATIVE DIAGNOSIS:  LEFT KNEE MEDIAL MENISCAL TEAR  POST-OPERATIVE DIAGNOSIS:  LEFT KNEE MEDIAL MENISCAL TEAR  PROCEDURE:  Procedure(s): ARTHROSCOPY KNEE (Left)  SURGEON:  Surgeon(s) and Role:    Dorna Leitz, MD - Primary  PHYSICIAN ASSISTANT:   ASSISTANTS: Joanell Rising PAc   ANESTHESIA:   general  EBL:  none  BLOOD ADMINISTERED:none  DRAINS: none   LOCAL MEDICATIONS USED:  MARCAINE     SPECIMEN:  No Specimen  DISPOSITION OF SPECIMEN:  N/A  COUNTS:  YES  TOURNIQUET:  * No tourniquets in log *  DICTATION: .Other Dictation: Dictation Number [id number]  PLAN OF CARE: Discharge to home after PACU  PATIENT DISPOSITION:  PACU - hemodynamically stable.   Delay start of Pharmacological VTE agent (>24hrs) due to surgical blood loss or risk of bleeding: no

## 2021-10-28 NOTE — Anesthesia Procedure Notes (Signed)
Procedure Name: LMA Insertion Date/Time: 10/28/2021 10:12 AM  Performed by: Cleda Daub, CRNAPre-anesthesia Checklist: Patient identified, Emergency Drugs available, Suction available and Patient being monitored Patient Re-evaluated:Patient Re-evaluated prior to induction Oxygen Delivery Method: Circle system utilized Preoxygenation: Pre-oxygenation with 100% oxygen Induction Type: IV induction LMA: LMA with gastric port inserted LMA Size: 3.0 Tube type: Oral Number of attempts: 1 Airway Equipment and Method: Oral airway Placement Confirmation: positive ETCO2 and breath sounds checked- equal and bilateral Tube secured with: Tape Dental Injury: Teeth and Oropharynx as per pre-operative assessment

## 2021-10-28 NOTE — Anesthesia Preprocedure Evaluation (Signed)
Anesthesia Evaluation  Patient identified by MRN, date of birth, ID band Patient awake    Reviewed: Allergy & Precautions, NPO status , Patient's Chart, lab work & pertinent test results  Airway Mallampati: I  TM Distance: >3 FB Neck ROM: Full    Dental  (+) Teeth Intact, Dental Advisory Given   Pulmonary neg shortness of breath, neg COPD, neg recent URI, former smoker,    breath sounds clear to auscultation       Cardiovascular hypertension, Pt. on medications (-) angina(-) Past MI and (-) CHF + dysrhythmias + pacemaker  Rhythm:Regular  Left ventricle: The cavity size was normal. Wall thickness was  normal. Systolic function was normal. The estimated ejection  fraction was in the range of 60% to 65%. Wall motion was normal;  there were no regional wall motion abnormalities. Doppler parameters  are consistent with abnormal left ventricular relaxation (grade 1  diastolic dysfunction).     medtronic pacemaker Ap ~50% of time VP ~5%   Neuro/Psych PSYCHIATRIC DISORDERS Anxiety Depression  Neuromuscular disease    GI/Hepatic hiatal hernia, GERD  Medicated,  Endo/Other  Hypothyroidism Lab Results      Component                Value               Date                      HGBA1C                   5.4                 10/16/2021             Renal/GU Renal InsufficiencyRenal diseaseLab Results      Component                Value               Date                      CREATININE               1.15 (H)            10/16/2021           Lab Results      Component                Value               Date                      K                        4.5                 10/16/2021                Musculoskeletal  (+) Arthritis ,   Abdominal   Peds  Hematology negative hematology ROS (+) Lab Results      Component                Value               Date                      WBC  5.6                  10/16/2021                HGB                      14.2                10/16/2021                HCT                      43.0                10/16/2021                MCV                      94.5                10/16/2021                PLT                      254                 10/16/2021              Anesthesia Other Findings   Reproductive/Obstetrics                             Anesthesia Physical Anesthesia Plan  ASA: 3  Anesthesia Plan: General   Post-op Pain Management: Ofirmev IV (intra-op)*   Induction: Intravenous  PONV Risk Score and Plan: 3 and Ondansetron and Dexamethasone  Airway Management Planned: LMA  Additional Equipment: None  Intra-op Plan:   Post-operative Plan: Extubation in OR  Informed Consent: I have reviewed the patients History and Physical, chart, labs and discussed the procedure including the risks, benefits and alternatives for the proposed anesthesia with the patient or authorized representative who has indicated his/her understanding and acceptance.     Dental advisory given  Plan Discussed with: CRNA  Anesthesia Plan Comments:         Anesthesia Quick Evaluation

## 2021-10-28 NOTE — H&P (Signed)
A pre op hand p   Chief Complaint: l knee pain  HPI: Cindy [name] is a 76 y.o. female who presents for evaluation of left knee pain. It has been present for greater than 6 months and has been worsening. She has failed conservative measures. Pain is rated as moderate.  Past Medical History:  Diagnosis Date   Abnormal nuclear stress test 12/15/2013   Anemia    hx iron def, no GI loss indentifed - resolved with diet change     Anxiety    Arthritis    Degenerative disc disease, cervical    Depression    Dysrhythmia    GERD (gastroesophageal reflux disease)    Britteny Fiebelkorn' DISEASE 2013   I-131 ablation, now post tx hypothyroid state   History of hiatal hernia    HYPERTENSION    Impaired glucose tolerance test    Keloid 12/31/2010   incisional    Pacemaker -Biotronik-CLS 08/20/2010   Fainted easily   Postablative hypothyroidism    Qualifier: Diagnosis of  By: Joya Gaskins CMA, Nova     Pre-diabetes    Sciatica    Syncope 08/20/2010   s/p PPM 08/2010 for sxc pause on loop recorder causing same   Unspecified vitamin D deficiency    Past Surgical History:  Procedure Laterality Date   APPENDECTOMY     blephroplasty     BREAST BIOPSY Left 2001   guided excisional bx (L) breast microcalcification at 12 o'clock- Benign   BUNIONECTOMY Left    CATARACT EXTRACTION  2014   FRACTURE SURGERY     ORIF 06-2010   HIATAL HERNIA REPAIR N/A 04/10/2020   Procedure: LAPAROSCOPIC REPAIR OF TYPE 3 HIATAL HERNIA WITH NISSEN FUNDOPLICATION;  Surgeon: Johnathan Hausen, MD;  Location: [facility];  Service: General;  Laterality: N/A;   LEFT HEART CATHETERIZATION WITH CORONARY ANGIOGRAM N/A 12/22/2013   Procedure: LEFT HEART CATHETERIZATION WITH CORONARY ANGIOGRAM;  Surgeon: Sinclair Grooms, MD;  Location: [facility] CATH LAB;  Service: Cardiovascular;  Laterality: N/A;   PACEMAKER INSERTION     Right ankle  06/2010   Trimalleolar fracture   TOTAL SHOULDER ARTHROPLASTY Left 01/27/2019   Procedure: TOTAL SHOULDER  ARTHROPLASTY;  Surgeon: Tania Ade, MD;  Location: [facility];  Service: Orthopedics;  Laterality: Left;   Social History   Socioeconomic History   Marital status: Married    Spouse name: Not on file   Number of children: Not on file   Years of education: Not on file   Highest education level: Not on file  Occupational History   Not on file  Tobacco Use   Smoking status: Former    Types: Cigarettes    Quit date: 04/08/1967    Years since quitting: 54.5   Smokeless tobacco: Never  Vaping Use   Vaping Use: Never used  Substance and Sexual Activity   Alcohol use: Yes    Alcohol/week: 2.0 standard drinks of alcohol    Types: 1 Glasses of wine, 1 Cans of beer per week    Comment: OCCASIONAL   Drug use: No   Sexual activity: Not Currently  Other Topics Concern   Not on file  Social History Narrative   ** Merged History Encounter **       Social Determinants of Health   Financial Resource Strain: Not on file  Food Insecurity: Not on file  Transportation Needs: Not on file  Physical Activity: Not on file  Stress: Not on file  Social Connections: Not on file  Family History  Problem Relation Age of Onset   Rheum arthritis Mother    Emphysema Mother    Hypertension Mother    Osteoporosis Mother    COPD Mother    Prostate cancer Father    Hypertension Father    Heart attack Father    Osteoporosis Maternal Grandmother    Stroke Other    Allergies  Allergen Reactions   Ace Inhibitors Swelling   Lisinopril Swelling    angiodema   Codeine Nausea And Vomiting   Prior to Admission medications   Medication Sig Start Date End Date Taking? Authorizing Provider  acetaminophen (TYLENOL) 650 MG CR tablet Take 1,300 mg by mouth every 8 (eight) hours as needed for pain.   Yes [provider]  ALPRAZolam Duanne Moron) 0.5 MG tablet Take 0.25 mg by mouth 2 (two) times daily as needed for anxiety. 05/19/17  Yes [provider]  amLODipine (NORVASC) 5 MG tablet TAKE 1  TABLET BY MOUTH EVERY DAY Patient taking differently: 2.5 mg. 07/23/21  Yes Deboraha Sprang, MD  buPROPion (WELLBUTRIN XL) 300 MG 24 hr tablet Take 300 mg by mouth every morning. 03/18/21  Yes [provider]  Cholecalciferol (VITAMIN D3) 20 MCG (800 UNIT) TABS Take 800 mg by mouth daily.   Yes [provider]  levothyroxine (SYNTHROID) 125 MCG tablet Take 125 mcg by mouth daily before breakfast. 06/09/19  Yes [provider]  meloxicam (MOBIC) 15 MG tablet Take 15 mg by mouth daily as needed for pain. 08/14/21  Yes [provider]  Multiple Vitamins-Minerals (EQ MULTIVITAMINS ADULT GUMMY PO) Take 2 tablets by mouth daily in the afternoon.   Yes [provider]  nebivolol (BYSTOLIC) 5 MG tablet Take 5 mg by mouth daily.   Yes [provider]  OVER THE COUNTER MEDICATION Take 1 capsule by mouth daily. Joint O Supplement   Yes [provider]  spironolactone (ALDACTONE) 50 MG tablet Take 50 mg by mouth daily.   Yes [provider]     Positive ROS: None  All other systems have been reviewed and were otherwise negative with the exception of those mentioned in the HPI and as above.  Physical Exam: Vitals:   10/28/21 0744  BP: (!) 147/70  Pulse: 69  Resp: 15  Temp: 97.9 F (36.6 C)  SpO2: 96%    General: Alert, no acute distress Cardiovascular: No pedal edema Respiratory: No cyanosis, no use of accessory musculature GI: No organomegaly, abdomen is soft and non-tender Skin: No lesions in the area of chief complaint Neurologic: Sensation intact distally Psychiatric: Patient is competent for consent with normal mood and affect Lymphatic: No axillary or cervical lymphadenopathy  MUSCULOSKELETAL: Left knee: Tender to palpation over the medial joint line.  Positive Murray.  No instability.  Trace effusion.  Assessment/Plan: LEFT KNEE MEDIAL MENISCAL TEAR Plan for Procedure(s): ARTHROSCOPY KNEE  The risks benefits and  alternatives were discussed with the patient including but not limited to the risks of nonoperative treatment, versus surgical intervention including infection, bleeding, nerve injury, malunion, nonunion, hardware prominence, hardware failure, need for hardware removal, blood clots, cardiopulmonary complications, morbidity, mortality, among others, and they were willing to proceed.  Predicted outcome is good, although there will be at least a six to nine month expected recovery.  Alta Corning, MD 10/28/2021 9:02 AM

## 2021-10-30 ENCOUNTER — Encounter (HOSPITAL_COMMUNITY): Payer: Self-pay | Admitting: Orthopedic Surgery

## 2021-10-30 NOTE — Anesthesia Postprocedure Evaluation (Signed)
Anesthesia Post Note  Patient: Cindy [name]  Procedure(s) Performed: ARTHROSCOPY KNEE (Left: Knee)     Patient location during evaluation: PACU Anesthesia Type: General Level of consciousness: awake and alert Pain management: pain level controlled Vital Signs Assessment: post-procedure vital signs reviewed and stable Respiratory status: spontaneous breathing, nonlabored ventilation, respiratory function stable and patient connected to nasal cannula oxygen Cardiovascular status: blood pressure returned to baseline and stable Postop Assessment: no apparent nausea or vomiting Anesthetic complications: no   No notable events documented.  Last Vitals:  Vitals:   10/28/21 1146 10/28/21 1215  BP: (!) 144/54 (!) 144/52  Pulse: (!) 57 (!) 58  Resp: 20 20  Temp: (!) 36.4 C   SpO2:      Last Pain:  Vitals:   10/28/21 1146  TempSrc: Oral  PainSc: 0-No pain                 Terris Bodin

## 2021-11-04 DIAGNOSIS — H5211 Myopia, right eye: Secondary | ICD-10-CM | POA: Diagnosis not present

## 2021-11-04 DIAGNOSIS — H43813 Vitreous degeneration, bilateral: Secondary | ICD-10-CM | POA: Diagnosis not present

## 2021-11-04 DIAGNOSIS — Z961 Presence of intraocular lens: Secondary | ICD-10-CM | POA: Diagnosis not present

## 2021-11-05 DIAGNOSIS — R262 Difficulty in walking, not elsewhere classified: Secondary | ICD-10-CM | POA: Diagnosis not present

## 2021-11-05 DIAGNOSIS — M1712 Unilateral primary osteoarthritis, left knee: Secondary | ICD-10-CM | POA: Diagnosis not present

## 2021-11-07 DIAGNOSIS — M25562 Pain in left knee: Secondary | ICD-10-CM | POA: Diagnosis not present

## 2021-11-07 DIAGNOSIS — Z4789 Encounter for other orthopedic aftercare: Secondary | ICD-10-CM | POA: Diagnosis not present

## 2021-11-12 DIAGNOSIS — M25562 Pain in left knee: Secondary | ICD-10-CM | POA: Diagnosis not present

## 2021-11-12 DIAGNOSIS — Z4789 Encounter for other orthopedic aftercare: Secondary | ICD-10-CM | POA: Diagnosis not present

## 2021-11-14 DIAGNOSIS — Z4789 Encounter for other orthopedic aftercare: Secondary | ICD-10-CM | POA: Diagnosis not present

## 2021-11-14 DIAGNOSIS — M25562 Pain in left knee: Secondary | ICD-10-CM | POA: Diagnosis not present

## 2021-11-19 DIAGNOSIS — Z4789 Encounter for other orthopedic aftercare: Secondary | ICD-10-CM | POA: Diagnosis not present

## 2021-11-19 DIAGNOSIS — M25562 Pain in left knee: Secondary | ICD-10-CM | POA: Diagnosis not present

## 2021-11-21 DIAGNOSIS — M25562 Pain in left knee: Secondary | ICD-10-CM | POA: Diagnosis not present

## 2021-11-21 DIAGNOSIS — Z4789 Encounter for other orthopedic aftercare: Secondary | ICD-10-CM | POA: Diagnosis not present

## 2021-11-26 DIAGNOSIS — Z4789 Encounter for other orthopedic aftercare: Secondary | ICD-10-CM | POA: Diagnosis not present

## 2021-11-26 DIAGNOSIS — M25562 Pain in left knee: Secondary | ICD-10-CM | POA: Diagnosis not present

## 2021-11-28 DIAGNOSIS — M25562 Pain in left knee: Secondary | ICD-10-CM | POA: Diagnosis not present

## 2021-11-28 DIAGNOSIS — Z4789 Encounter for other orthopedic aftercare: Secondary | ICD-10-CM | POA: Diagnosis not present

## 2021-12-03 DIAGNOSIS — Z4789 Encounter for other orthopedic aftercare: Secondary | ICD-10-CM | POA: Diagnosis not present

## 2021-12-03 DIAGNOSIS — M25562 Pain in left knee: Secondary | ICD-10-CM | POA: Diagnosis not present

## 2021-12-05 DIAGNOSIS — M25562 Pain in left knee: Secondary | ICD-10-CM | POA: Diagnosis not present

## 2021-12-05 DIAGNOSIS — Z4789 Encounter for other orthopedic aftercare: Secondary | ICD-10-CM | POA: Diagnosis not present

## 2021-12-11 DIAGNOSIS — Z4789 Encounter for other orthopedic aftercare: Secondary | ICD-10-CM | POA: Diagnosis not present

## 2021-12-11 DIAGNOSIS — M25562 Pain in left knee: Secondary | ICD-10-CM | POA: Diagnosis not present

## 2021-12-19 DIAGNOSIS — Z9889 Other specified postprocedural states: Secondary | ICD-10-CM | POA: Diagnosis not present

## 2021-12-19 DIAGNOSIS — M25562 Pain in left knee: Secondary | ICD-10-CM | POA: Diagnosis not present

## 2022-01-23 DIAGNOSIS — M25562 Pain in left knee: Secondary | ICD-10-CM | POA: Diagnosis not present

## 2022-01-31 ENCOUNTER — Other Ambulatory Visit: Payer: Self-pay | Admitting: Internal Medicine

## 2022-02-07 ENCOUNTER — Telehealth: Payer: Self-pay

## 2022-02-07 ENCOUNTER — Telehealth: Payer: Self-pay | Admitting: Internal Medicine

## 2022-02-07 NOTE — Telephone Encounter (Signed)
   Pre-operative Risk Assessment    Patient Name: Cindy [name]  DOB: [birth year] MRN: [id number]      Request for Surgical Clearance    Procedure:   Left total knee arthoplasty   Date of Surgery:  Clearance TBD                             Surgeon:  Dr. Dorna Leitz  Surgeon's Group or Practice Name:  [facility]  Phone number:  [phone] Fax number:  [phone]   Type of Clearance Requested:   Medical  Pharmacy (not sure)    Type of Anesthesia:  Spinal   Additional requests/questions:    [name]   02/07/2022, 11:05 AM

## 2022-02-07 NOTE — Telephone Encounter (Signed)
  Patient Consent for Virtual Visit         Cindy [name] has provided verbal consent on 02/07/2022 for a virtual visit (video or telephone).   CONSENT FOR VIRTUAL VISIT FOR:  Cindy [name]  By participating in this virtual visit I agree to the following:  I hereby voluntarily request, consent and authorize [facility] and its employed or contracted physicians, physician assistants, nurse practitioners or other licensed health care professionals (the Practitioner), to provide me with telemedicine health care services (the "Services") as deemed necessary by the treating Practitioner. I acknowledge and consent to receive the Services by the Practitioner via telemedicine. I understand that the telemedicine visit will involve communicating with the Practitioner through live audiovisual communication technology and the disclosure of certain medical information by electronic transmission. I acknowledge that I have been given the opportunity to request an in-person assessment or other available alternative prior to the telemedicine visit and am voluntarily participating in the telemedicine visit.  I understand that I have the right to withhold or withdraw my consent to the use of telemedicine in the course of my care at any time, without affecting my right to future care or treatment, and that the Practitioner or I may terminate the telemedicine visit at any time. I understand that I have the right to inspect all information obtained and/or recorded in the course of the telemedicine visit and may receive copies of available information for a reasonable fee.  I understand that some of the potential risks of receiving the Services via telemedicine include:  Delay or interruption in medical evaluation due to technological equipment failure or disruption; Information transmitted may not be sufficient (e.g. poor resolution of images) to allow for appropriate medical decision making by the  Practitioner; and/or  In rare instances, security protocols could fail, causing a breach of personal health information.  Furthermore, I acknowledge that it is my responsibility to provide information about my medical history, conditions and care that is complete and accurate to the best of my ability. I acknowledge that Practitioner's advice, recommendations, and/or decision may be based on factors not within their control, such as incomplete or inaccurate data provided by me or distortions of diagnostic images or specimens that may result from electronic transmissions. I understand that the practice of medicine is not an exact science and that Practitioner makes no warranties or guarantees regarding treatment outcomes. I acknowledge that a copy of this consent can be made available to me via my patient portal ([facility]), or I can request a printed copy by calling the office of [facility].    I understand that my insurance will be billed for this visit.   I have read or had this consent read to me. I understand the contents of this consent, which adequately explains the benefits and risks of the Services being provided via telemedicine.  I have been provided ample opportunity to ask questions regarding this consent and the Services and have had my questions answered to my satisfaction. I give my informed consent for the services to be provided through the use of telemedicine in my medical care

## 2022-02-07 NOTE — Telephone Encounter (Signed)
   Name: Cindy [name]  DOB: December 07, 1945  MRN: [id number]  Primary Cardiologist: None   Preoperative team, please contact this patient and set up a phone call appointment for further preoperative risk assessment. Please obtain consent and complete medication review. Thank you for your help.  I confirm that guidance regarding antiplatelet and oral anticoagulation therapy has been completed and, if necessary, noted below (n/a).   Lenna Sciara, NP 02/07/2022, 11:18 AM [place]

## 2022-02-07 NOTE — Telephone Encounter (Signed)
Patient is scheduled for a tele visit on 02/13/22.. Med rec and consent done

## 2022-02-10 NOTE — Progress Notes (Unsigned)
Virtual Visit via Telephone Note   Because of Cindy [name] co-morbid illnesses, she is at least at moderate risk for complications without adequate follow up.  This format is felt to be most appropriate for this patient at this time.  The patient did not have access to video technology/had technical difficulties with video requiring transitioning to audio format only (telephone).  All issues noted in this document were discussed and addressed.  No physical exam could be performed with this format.  Please refer to the patient's chart for her consent to telehealth for [facility].  Evaluation Performed:  Preoperative cardiovascular risk assessment _____________   Date:  02/13/2022   Patient ID:  Cindy [name], DOB [birth year], MRN [id number] Patient Location:  Home Provider location:   Office  Primary Care Provider:  Michael Boston, MD Primary Cardiologist:  None  Chief Complaint / Patient Profile   76 y.o. y/o female with a h/o vasovagal syncope without symptomatic pauses s/p Biotronik PPM implant 2012, atrial tachycardia, HTN who is pending left total knee arthroplasty and presents today for telephonic preoperative cardiovascular risk assessment.  Past Medical History    Past Medical History:  Diagnosis Date   Abnormal nuclear stress test 12/15/2013   Anemia    hx iron def, no GI loss indentifed - resolved with diet change     Anxiety    Arthritis    Degenerative disc disease, cervical    Depression    Dysrhythmia    GERD (gastroesophageal reflux disease)    GRAVES' DISEASE 2013   I-131 ablation, now post tx hypothyroid state   History of hiatal hernia    HYPERTENSION    Impaired glucose tolerance test    Keloid 12/31/2010   incisional    Pacemaker -Biotronik-CLS 08/20/2010   Fainted easily   Postablative hypothyroidism    Qualifier: Diagnosis of  By: Joya Gaskins CMA, Nova     Pre-diabetes    Sciatica    Syncope 08/20/2010   s/p PPM 08/2010 for sxc  pause on loop recorder causing same   Unspecified vitamin D deficiency    Past Surgical History:  Procedure Laterality Date   APPENDECTOMY     blephroplasty     BREAST BIOPSY Left 2001   guided excisional bx (L) breast microcalcification at 12 o'clock- Benign   BUNIONECTOMY Left    CATARACT EXTRACTION  2014   FRACTURE SURGERY     ORIF 06-2010   HIATAL HERNIA REPAIR N/A 04/10/2020   Procedure: LAPAROSCOPIC REPAIR OF TYPE 3 HIATAL HERNIA WITH NISSEN FUNDOPLICATION;  Surgeon: Johnathan Hausen, MD;  Location: [facility];  Service: General;  Laterality: N/A;   KNEE ARTHROSCOPY Left 10/28/2021   Procedure: ARTHROSCOPY KNEE;  Surgeon: Dorna Leitz, MD;  Location: [facility];  Service: Orthopedics;  Laterality: Left;   LEFT HEART CATHETERIZATION WITH CORONARY ANGIOGRAM N/A 12/22/2013   Procedure: LEFT HEART CATHETERIZATION WITH CORONARY ANGIOGRAM;  Surgeon: Sinclair Grooms, MD;  [facility] C[facility];  Service: Cardiovascular;  Laterality: N/A;   PACEMAKER INSERTION     Right ankle  06/2010   Trimalleolar fracture   TOTAL SHOULDER ARTHROPLASTY Left 01/27/2019   Procedure: TOTAL SHOULDER ARTHROPLASTY;  Surgeon: Tania Ade, MD;  Location: [facility];  Service: Orthopedics;  Laterality: Left;    Allergies  Allergies  Allergen Reactions   Ace Inhibitors Swelling   Lisinopril Swelling    angiodema   Codeine Nausea And Vomiting    History of Present Illness  Cindy [name] is a 76 y.o. female who presents via audio/video conferencing for a telehealth visit today.  Pt was last seen in cardiology clinic on 10/10/21 by Dr. Caryl Comes.  At that time Cindy [name] was doing well.  The patient is now pending procedure as outlined above. Since her last visit, she denies chest pain, shortness of breath, lower extremity edema, fatigue, palpitations, melena, hematuria, hemoptysis, diaphoresis, weakness, presyncope, syncope, orthopnea, and PND.   Home Medications    Prior to Admission medications    Medication Sig Start Date End Date Taking? Authorizing Provider  ALPRAZolam Duanne Moron) 0.5 MG tablet Take 0.25 mg by mouth 2 (two) times daily as needed for anxiety. 05/19/17   [provider]  amLODipine (NORVASC) 5 MG tablet TAKE 1 TABLET BY MOUTH EVERY DAY 01/31/22   Deboraha Sprang, MD  buPROPion (WELLBUTRIN XL) 300 MG 24 hr tablet Take 300 mg by mouth every morning. 03/18/21   [provider]  HYDROcodone-acetaminophen (NORCO/VICODIN) 5-325 MG tablet Take 1 tablet by mouth every 4 (four) hours as needed for moderate pain. 10/28/21 10/28/22  Leighton Parody, PA-C  levothyroxine (SYNTHROID) 125 MCG tablet Take 125 mcg by mouth daily before breakfast. 06/09/19   [provider]  meloxicam (MOBIC) 15 MG tablet Take 15 mg by mouth daily as needed for pain. 08/14/21   [provider]  Multiple Vitamins-Minerals (EQ MULTIVITAMINS ADULT GUMMY PO) Take 2 tablets by mouth daily in the afternoon.    [provider]  nebivolol (BYSTOLIC) 5 MG tablet Take 5 mg by mouth daily.    [provider]  OVER THE COUNTER MEDICATION Take 1 capsule by mouth daily. Joint O Supplement    [provider]  spironolactone (ALDACTONE) 50 MG tablet Take 50 mg by mouth daily.    [provider]    Physical Exam    Vital Signs:  Cindy [name] does not have vital signs available for review today.  Given telephonic nature of communication, physical exam is limited. AAOx3. NAD. Normal affect.  Speech and respirations are unlabored.  Accessory Clinical Findings    None  Assessment & Plan    1.  Preoperative Cardiovascular Risk Assessment: The patient is doing well from a cardiac perspective. Therefore, based on ACC/AHA guidelines, the patient would be at acceptable risk for the planned procedure without further cardiovascular testing. According to the Revised Cardiac Risk Index (RCRI), her Perioperative Risk of Major Cardiac Event is (%): 0.9 Her  Functional Capacity in METs is: 6.05 according to the [place] Activity Status Index (DASI).  The patient was advised that if she develops new symptoms prior to surgery to contact our office to arrange for a follow-up visit, and she verbalized understanding.  No request to hold cardiac medications.   A copy of this note will be routed to requesting surgeon.  Time:   Today, I have spent 8 minutes with the patient with telehealth technology discussing medical history, symptoms, and management plan.    Emmaline Life, NP-C  02/13/2022, 9:16 AM [address], [address] Office [phone] Fax [phone]

## 2022-02-13 ENCOUNTER — Ambulatory Visit: Payer: PPO | Attending: Internal Medicine | Admitting: Nurse Practitioner

## 2022-02-13 ENCOUNTER — Encounter: Payer: Self-pay | Admitting: Nurse Practitioner

## 2022-02-13 DIAGNOSIS — Z01810 Encounter for preprocedural cardiovascular examination: Secondary | ICD-10-CM | POA: Diagnosis not present

## 2022-02-24 DIAGNOSIS — E785 Hyperlipidemia, unspecified: Secondary | ICD-10-CM | POA: Diagnosis not present

## 2022-02-24 DIAGNOSIS — E890 Postprocedural hypothyroidism: Secondary | ICD-10-CM | POA: Diagnosis not present

## 2022-02-24 DIAGNOSIS — R7301 Impaired fasting glucose: Secondary | ICD-10-CM | POA: Diagnosis not present

## 2022-03-03 ENCOUNTER — Other Ambulatory Visit (HOSPITAL_BASED_OUTPATIENT_CLINIC_OR_DEPARTMENT_OTHER): Payer: Self-pay | Admitting: Endocrinology

## 2022-03-03 DIAGNOSIS — R7301 Impaired fasting glucose: Secondary | ICD-10-CM | POA: Diagnosis not present

## 2022-03-03 DIAGNOSIS — E890 Postprocedural hypothyroidism: Secondary | ICD-10-CM | POA: Diagnosis not present

## 2022-03-03 DIAGNOSIS — E785 Hyperlipidemia, unspecified: Secondary | ICD-10-CM

## 2022-03-03 DIAGNOSIS — N189 Chronic kidney disease, unspecified: Secondary | ICD-10-CM | POA: Diagnosis not present

## 2022-03-03 DIAGNOSIS — G629 Polyneuropathy, unspecified: Secondary | ICD-10-CM | POA: Diagnosis not present

## 2022-03-03 DIAGNOSIS — I10 Essential (primary) hypertension: Secondary | ICD-10-CM | POA: Diagnosis not present

## 2022-03-07 ENCOUNTER — Other Ambulatory Visit: Payer: Self-pay | Admitting: Orthopedic Surgery

## 2022-03-10 ENCOUNTER — Encounter: Payer: Self-pay | Admitting: Internal Medicine

## 2022-03-10 NOTE — Patient Instructions (Addendum)
DUE TO COVID-19 ONLY TWO VISITORS  (aged 76 and older)  ARE ALLOWED TO COME WITH YOU AND STAY IN THE WAITING ROOM ONLY DURING PRE OP AND PROCEDURE.   **NO VISITORS ARE ALLOWED IN THE SHORT STAY AREA OR RECOVERY ROOM!!**  IF YOU WILL BE ADMITTED INTO THE HOSPITAL YOU ARE ALLOWED ONLY FOUR SUPPORT PEOPLE DURING VISITATION HOURS ONLY (7 AM -8PM)   The support person(s) must pass our screening, gel in and out, and wear a mask at all times, including in the patient's room. Patients must also wear a mask when staff or their support person are in the room. Visitors GUEST BADGE MUST BE WORN VISIBLY  One adult visitor may remain with you overnight and MUST be in the room by 8 P.M.     Your procedure is scheduled on: 03/17/22   Report to [facility] Main Entrance    Report to admitting at : 10:00 AM   Call this number if you have problems the morning of surgery [phone]   Do not eat food :After Midnight.   After Midnight you may have the following liquids until : 9:30 AM DAY OF SURGERY  Water Black Coffee (sugar ok, NO MILK/CREAM OR CREAMERS)  Tea (sugar ok, NO MILK/CREAM OR CREAMERS) regular and decaf                             Plain Jell-O (NO RED)                                           Fruit ices (not with fruit pulp, NO RED)                                     Popsicles (NO RED)                                                                  Juice: apple, WHITE grape, WHITE cranberry Sports drinks like Gatorade (NO RED)     The day of surgery:  Drink ONE (1) Pre-Surgery Clear Ensure or G2 at : 9:30 AM the morning of surgery. Drink in one sitting. Do not sip.  This drink was given to you during your hospital  pre-op appointment visit. Nothing else to drink after completing the  Pre-Surgery Clear Ensure or G2.          If you have questions, please contact your surgeon's office   Oral Hygiene is also important to reduce your risk of infection.                                     Remember - BRUSH YOUR TEETH THE MORNING OF SURGERY WITH YOUR REGULAR TOOTHPASTE  DENTURES WILL BE REMOVED PRIOR TO SURGERY PLEASE DO NOT APPLY "Poly grip" OR ADHESIVES!!!   Do NOT smoke after Midnight   Take these medicines the morning of surgery with A SIP OF WATER: bupropion,nebivolol,amlodipine,levothyroxine.Alprazolam as needed.  DO NOT TAKE ANY ORAL DIABETIC MEDICATIONS DAY OF YOUR SURGERY  Bring CPAP mask and tubing day of surgery.                              You may not have any metal on your body including hair pins, jewelry, and body piercing             Do not wear make-up, lotions, powders, perfumes/cologne, or deodorant  Do not wear nail polish including gel and S&S, artificial/acrylic nails, or any other type of covering on natural nails including finger and toenails. If you have artificial nails, gel coating, etc. that needs to be removed by a nail salon please have this removed prior to surgery or surgery may need to be canceled/ delayed if the surgeon/ anesthesia feels like they are unable to be safely monitored.   Do not shave  48 hours prior to surgery.    Do not bring valuables to the hospital. [place].   Contacts, glasses, or bridgework may not be worn into surgery.   Bring small overnight bag day of surgery.   DO NOT [place]. PHARMACY WILL DISPENSE MEDICATIONS LISTED ON YOUR MEDICATION LIST TO YOU DURING YOUR ADMISSION [place]!    Patients discharged on the day of surgery will not be allowed to drive home.  Someone NEEDS to stay with you for the first 24 hours after anesthesia.   Special Instructions: Bring a copy of your healthcare power of attorney and living will documents         the day of surgery if you haven't scanned them before.              Please read over the following fact sheets you were given: IF YOU HAVE QUESTIONS ABOUT YOUR PRE-OP  INSTRUCTIONS PLEASE CALL [phone]    [facility] - Preparing for Surgery Before surgery, you can play an important role.  Because skin is not sterile, your skin needs to be as free of germs as possible.  You can reduce the number of germs on your skin by washing with CHG (chlorahexidine gluconate) soap before surgery.  CHG is an antiseptic cleaner which kills germs and bonds with the skin to continue killing germs even after washing. Please DO NOT use if you have an allergy to CHG or antibacterial soaps.  If your skin becomes reddened/irritated stop using the CHG and inform your nurse when you arrive at Short Stay. Do not shave (including legs and underarms) for at least 48 hours prior to the first CHG shower.  You may shave your face/neck. Please follow these instructions carefully:  1.  Shower with CHG Soap the night before surgery and the  morning of Surgery.  2.  If you choose to wash your hair, wash your hair first as usual with your  normal  shampoo.  3.  After you shampoo, rinse your hair and body thoroughly to remove the  shampoo.                           4.  Use CHG as you would any other liquid soap.  You can apply chg directly  to the skin and wash  Gently with a scrungie or clean washcloth.  5.  Apply the CHG Soap to your body ONLY FROM THE NECK DOWN.   Do not use on face/ open                           Wound or open sores. Avoid contact with eyes, ears mouth and genitals (private parts).                       Wash face,  Genitals (private parts) with your normal soap.             6.  Wash thoroughly, paying special attention to the area where your surgery  will be performed.  7.  Thoroughly rinse your body with warm water from the neck down.  8.  DO NOT shower/wash with your normal soap after using and rinsing off  the CHG Soap.                9.  Pat yourself dry with a clean towel.            10.  Wear clean pajamas.            11.  Place clean sheets on  your bed the night of your first shower and do not  sleep with pets. Day of Surgery : Do not apply any lotions/deodorants the morning of surgery.  Please wear clean clothes to the hospital/surgery center.  FAILURE TO FOLLOW THESE INSTRUCTIONS MAY RESULT IN THE CANCELLATION OF YOUR SURGERY PATIENT SIGNATURE_________________________________  NURSE SIGNATURE__________________________________  ________________________________________________________________________  Adam Phenix  An incentive spirometer is a tool that can help keep your lungs clear and active. This tool measures how well you are filling your lungs with each breath. Taking long deep breaths may help reverse or decrease the chance of developing breathing (pulmonary) problems (especially infection) following: A long period of time when you are unable to move or be active. BEFORE THE PROCEDURE  If the spirometer includes an indicator to show your best effort, your nurse or respiratory therapist will set it to a desired goal. If possible, sit up straight or lean slightly forward. Try not to slouch. Hold the incentive spirometer in an upright position. INSTRUCTIONS FOR USE  Sit on the edge of your bed if possible, or sit up as far as you can in bed or on a chair. Hold the incentive spirometer in an upright position. Breathe out normally. Place the mouthpiece in your mouth and seal your lips tightly around it. Breathe in slowly and as deeply as possible, raising the piston or the ball toward the top of the column. Hold your breath for 3-5 seconds or for as long as possible. Allow the piston or ball to fall to the bottom of the column. Remove the mouthpiece from your mouth and breathe out normally. Rest for a few seconds and repeat Steps 1 through 7 at least 10 times every 1-2 hours when you are awake. Take your time and take a few normal breaths between deep breaths. The spirometer may include an indicator to show your best  effort. Use the indicator as a goal to work toward during each repetition. After each set of 10 deep breaths, practice coughing to be sure your lungs are clear. If you have an incision (the cut made at the time of surgery), support your incision when coughing by placing a pillow or rolled up towels firmly  against it. Once you are able to get out of bed, walk around indoors and cough well. You may stop using the incentive spirometer when instructed by your caregiver.  RISKS AND COMPLICATIONS Take your time so you do not get dizzy or light-headed. If you are in pain, you may need to take or ask for pain medication before doing incentive spirometry. It is harder to take a deep breath if you are having pain. AFTER USE Rest and breathe slowly and easily. It can be helpful to keep track of a log of your progress. Your caregiver can provide you with a simple table to help with this. If you are using the spirometer at home, follow these instructions: [place] IF:  You are having difficultly using the spirometer. You have trouble using the spirometer as often as instructed. Your pain medication is not giving enough relief while using the spirometer. You develop fever of 100.5 F (38.1 C) or higher. SEEK IMMEDIATE MEDICAL CARE IF:  You cough up bloody sputum that had not been present before. You develop fever of 102 F (38.9 C) or greater. You develop worsening pain at or near the incision site. MAKE SURE YOU:  Understand these instructions. Will watch your condition. Will get help right away if you are not doing well or get worse. Document Released: 08/04/2006 Document Revised: 06/16/2011 Document Reviewed: 10/05/2006 [facility] Patient Information 2014 [place], [place].   ________________________________________________________________________

## 2022-03-10 NOTE — Progress Notes (Signed)
[place] DEVICE PROGRAMMING  Patient Information: Name:  Cindy [name]  DOB:  1945-05-31  MRN:  [id number]    Planned Procedure:  Total knee arthroplasty  Surgeon:  Dr. Dorna Leitz  Date of Procedure:  03/17/22  Cautery will be used.  Position during surgery:    Please send documentation back to:  [facility] (Fax # [phone])  Device Information:  Clinic EP Physician:  Virl Axe, MD   Device Type:  Pacemaker Manufacturer and Phone #:  Biotronik: [phone] Pacemaker Dependent?:  Unknown Date of Last Device Check:  10/10/2021 Normal Device Function?:  Yes.    Electrophysiologist's Recommendations:  Have magnet available. Provide continuous ECG monitoring when magnet is used or reprogramming is to be performed.  Procedure should not interfere with device function.  No device programming or magnet placement needed.  Per Device Clinic Standing Orders, Cindy BAXLEY, RN  4:19 PM 03/10/2022

## 2022-03-11 ENCOUNTER — Encounter (HOSPITAL_COMMUNITY)
Admission: RE | Admit: 2022-03-11 | Discharge: 2022-03-11 | Disposition: A | Discharge status: Home / Self Care | Payer: PPO | Source: Ambulatory Visit | Attending: Orthopedic Surgery | Admitting: Orthopedic Surgery

## 2022-03-11 ENCOUNTER — Other Ambulatory Visit: Payer: Self-pay

## 2022-03-11 ENCOUNTER — Encounter (HOSPITAL_COMMUNITY): Payer: Self-pay

## 2022-03-11 ENCOUNTER — Ambulatory Visit (HOSPITAL_COMMUNITY)
Admission: RE | Admit: 2022-03-11 | Discharge: 2022-03-11 | Disposition: A | Discharge status: Home / Self Care | Payer: PPO | Source: Ambulatory Visit | Attending: Orthopedic Surgery | Admitting: Orthopedic Surgery

## 2022-03-11 VITALS — BP 145/70 | HR 55 | Temp 98.2°F | Ht 62.0 in | Wt 159.0 lb

## 2022-03-11 DIAGNOSIS — M1712 Unilateral primary osteoarthritis, left knee: Secondary | ICD-10-CM | POA: Insufficient documentation

## 2022-03-11 DIAGNOSIS — E0500 Thyrotoxicosis with diffuse goiter without thyrotoxic crisis or storm: Secondary | ICD-10-CM | POA: Diagnosis not present

## 2022-03-11 DIAGNOSIS — Z01818 Encounter for other preprocedural examination: Secondary | ICD-10-CM | POA: Diagnosis not present

## 2022-03-11 DIAGNOSIS — I10 Essential (primary) hypertension: Secondary | ICD-10-CM

## 2022-03-11 DIAGNOSIS — Z87891 Personal history of nicotine dependence: Secondary | ICD-10-CM | POA: Insufficient documentation

## 2022-03-11 DIAGNOSIS — E119 Type 2 diabetes mellitus without complications: Secondary | ICD-10-CM

## 2022-03-11 DIAGNOSIS — R911 Solitary pulmonary nodule: Secondary | ICD-10-CM | POA: Insufficient documentation

## 2022-03-11 LAB — BASIC METABOLIC PANEL
Anion gap: 8 (ref 5–15)
BUN: 24 mg/dL — ABNORMAL HIGH (ref 8–23)
CO2: 26 mmol/L (ref 22–32)
Calcium: 9.5 mg/dL (ref 8.9–10.3)
Chloride: 105 mmol/L (ref 98–111)
Creatinine, Ser: 1.09 mg/dL — ABNORMAL HIGH (ref 0.44–1.00)
GFR, Estimated: 53 mL/min — ABNORMAL LOW (ref >60)
Glucose, Bld: 103 mg/dL — ABNORMAL HIGH (ref 70–99)
Potassium: 4.3 mmol/L (ref 3.5–5.1)
Sodium: 139 mmol/L (ref 135–145)

## 2022-03-11 LAB — GLUCOSE, CAPILLARY: Glucose-Capillary: 113 mg/dL — ABNORMAL HIGH (ref 70–99)

## 2022-03-11 LAB — CBC
HCT: 44.3 % (ref 36.0–46.0)
Hemoglobin: 14.5 g/dL (ref 12.0–15.0)
MCH: 30.8 pg (ref 26.0–34.0)
MCHC: 32.7 g/dL (ref 30.0–36.0)
MCV: 94.1 fL (ref 80.0–100.0)
Platelets: 298 10*3/uL (ref 150–400)
RBC: 4.71 MIL/uL (ref 3.87–5.11)
RDW: 12.7 % (ref 11.5–15.5)
WBC: 5.5 10*3/uL (ref 4.0–10.5)
nRBC: 0 % (ref 0.0–0.2)

## 2022-03-11 LAB — SURGICAL PCR SCREEN
MRSA, PCR: NEGATIVE
Staphylococcus aureus: NEGATIVE

## 2022-03-11 NOTE — Progress Notes (Addendum)
For Short Stay: [place] appointment date:  Bowel Prep reminder:   For Anesthesia: PCP - Dr. Cristie Hem Cardiologist - Dr. Virl Axe. Clearance: Christen Bame: NP.  Chest x-ray -  EKG - 10/10/21 Stress Test -  ECHO -  Cardiac Cath - 2015 Pacemaker/ICD device last checked: 10/10/21 Pacemaker orders received: yes : Chart/EPIC Device Rep notified: N/A  Spinal Cord Stimulator:  Sleep Study -  CPAP -   Fasting Blood Sugar - N/A Checks Blood Sugar __0___ times a day Date and result of last Hgb A1c-5.4: 10/16/21  Last dose of GLP1 agonist-  GLP1 instructions:   Last dose of SGLT-2 inhibitors-  SGLT-2 instructions:   Blood Thinner Instructions: Aspirin Instructions: Last Dose:  Activity level: Can go up a flight of stairs and activities of daily living without stopping and without chest pain and/or shortness of breath   Able to exercise without chest pain and/or shortness of breath   Unable to go up a flight of stairs without chest pain and/or shortness of breath     Anesthesia review: Hx: Dysrhythmias,HTN,Pre-DIA,Pacemaker.  Patient denies shortness of breath, fever, cough and chest pain at PAT appointment   Patient verbalized understanding of instructions that were given to them at the PAT appointment. Patient was also instructed that they will need to review over the PAT instructions again at home before surgery.

## 2022-03-11 NOTE — Care Plan (Signed)
Ortho Bundle Case Management Note  Patient Details  Name: Cindy [name] MRN: [id number] Date of Birth: 31-Aug-1945  spoke with patient. will discharge to home with family. has a rolling Skiff at home. HHPT referral to [facility]. OPPT set up with [facility]. discharge instructions discussed and mailed to patient. she is requesting an overnight stay with her surgery being later in the day. MD aware. questions answered.                DME Arranged:    DME Agency:     HH Arranged:  PT HH Agency:  [facility]  Additional Comments: Please contact me with any questions of if this plan should need to change.  Ladell Heads,  [place] [facility]  [phone] 03/11/2022, 3:55 PM

## 2022-03-12 LAB — HEMOGLOBIN A1C
Hgb A1c MFr Bld: 5.9 % — ABNORMAL HIGH (ref 4.8–5.6)
Mean Plasma Glucose: 123 mg/dL

## 2022-03-12 NOTE — Progress Notes (Signed)
Anesthesia Chart Review   Case: [id number] Date/Time: 03/17/22 1215   Procedure: TOTAL KNEE ARTHROPLASTY (Left: Knee)   Anesthesia type: Spinal   Pre-op diagnosis: LEFT KNEE DEGENERATIVE JOINT DISEASE   Location: [place] 06 / [facility] OR[facility]   Surgeons: Dorna Leitz, MD       DISCUSSION:76 y.o. former smoker with h/o HTN, Grave's disease, vasovagal syncope without symptomatic pauses s/p Biotronik PPM implant 2012 (device orders in 03/10/2022 progress note), left knee djd scheduled for above procedure 03/17/2022 with Dr. Dorna Leitz.   Pt seen by cardiology 02/13/2022 for preoperative evaluation.  Per OV note, "Preoperative Cardiovascular Risk Assessment: The patient is doing well from a cardiac perspective. Therefore, based on ACC/AHA guidelines, the patient would be at acceptable risk for the planned procedure without further cardiovascular testing. According to the Revised Cardiac Risk Index (RCRI), her Perioperative Risk of Major Cardiac Event is (%): 0.9 Her Functional Capacity in METs is: 6.05 according to the Duke Activity Status Index (DASI)."  Anticipate pt can proceed with planned procedure barring acute status change.   VS: BP (!) 145/70   Pulse (!) 55   Temp 36.8 C (Oral)   Ht '5\' 2"'$  (1.575 m)   Wt 72.1 kg   SpO2 98%   BMI 29.08 kg/m   PROVIDERS: Michael Boston, MD is PCP    LABS: Labs reviewed: Acceptable for surgery. (all labs ordered are listed, but only abnormal results are displayed)  Labs Reviewed  HEMOGLOBIN A1C - Abnormal; Notable for the following components:      Result Value   Hgb A1c MFr Bld 5.9 (*)    All other components within normal limits  BASIC METABOLIC PANEL - Abnormal; Notable for the following components:   Glucose, Bld 103 (*)    BUN 24 (*)    Creatinine, Ser 1.09 (*)    GFR, Estimated 53 (*)    All other components within normal limits  GLUCOSE, CAPILLARY - Abnormal; Notable for the following components:   Glucose-Capillary 113 (*)    All other  components within normal limits  SURGICAL PCR SCREEN  CBC     IMAGES:   EKG:   CV:  Past Medical History:  Diagnosis Date   Abnormal nuclear stress test 12/15/2013   Anemia    hx iron def, no GI loss indentifed - resolved with diet change     Anxiety    Arthritis    Degenerative disc disease, cervical    Depression    Dysrhythmia    GERD (gastroesophageal reflux disease)    GRAVES' DISEASE 2013   I-131 ablation, now post tx hypothyroid state   History of hiatal hernia    HYPERTENSION    Impaired glucose tolerance test    Keloid 12/31/2010   incisional    Pacemaker -Biotronik-CLS 08/20/2010   Fainted easily   Postablative hypothyroidism    Qualifier: Diagnosis of  By: Joya Gaskins CMA, [place]     Pre-diabetes    Sciatica    Syncope 08/20/2010   s/p PPM 08/2010 for sxc pause on loop recorder causing same   Unspecified vitamin D deficiency     Past Surgical History:  Procedure Laterality Date   APPENDECTOMY     blephroplasty     BREAST BIOPSY Left 2001   guided excisional bx (L) breast microcalcification at 12 o'clock- Benign   BUNIONECTOMY Left    CATARACT EXTRACTION  2014   FRACTURE SURGERY     ORIF 06-2010   HIATAL HERNIA REPAIR  N/A 04/10/2020   Procedure: LAPAROSCOPIC REPAIR OF TYPE 3 HIATAL HERNIA WITH NISSEN FUNDOPLICATION;  Surgeon: Johnathan Hausen, MD;  Location: [facility];  Service: General;  Laterality: N/A;   KNEE ARTHROSCOPY Left 10/28/2021   Procedure: ARTHROSCOPY KNEE;  Surgeon: Dorna Leitz, MD;  Location: [facility];  Service: Orthopedics;  Laterality: Left;   LEFT HEART CATHETERIZATION WITH CORONARY ANGIOGRAM N/A 12/22/2013   Procedure: LEFT HEART CATHETERIZATION WITH CORONARY ANGIOGRAM;  Surgeon: Sinclair Grooms, MD;  [facility] LAB;  Service: Cardiovascular;  Laterality: N/A;   PACEMAKER INSERTION     Right ankle  06/2010   Trimalleolar fracture   TOTAL SHOULDER ARTHROPLASTY Left 01/27/2019   Procedure: TOTAL SHOULDER ARTHROPLASTY;  Surgeon:  Tania Ade, MD;  Location: [facility];  Service: Orthopedics;  Laterality: Left;    MEDICATIONS:  ALPRAZolam (XANAX) 0.5 MG tablet   amLODipine (NORVASC) 5 MG tablet   buPROPion (WELLBUTRIN XL) 300 MG 24 hr tablet   Cholecalciferol (VITAMIN D3 PO)   HYDROcodone-acetaminophen (NORCO/VICODIN) 5-325 MG tablet   levothyroxine (SYNTHROID) 125 MCG tablet   meloxicam (MOBIC) 15 MG tablet   Multiple Vitamins-Minerals (EQ MULTIVITAMINS ADULT GUMMY PO)   nebivolol (BYSTOLIC) 5 MG tablet   spironolactone (ALDACTONE) 50 MG tablet   No current facility-administered medications for this encounter.     Konrad Felix Ward, PA-C [facility] Pre-Surgical Testing [phone]

## 2022-03-13 DIAGNOSIS — M25662 Stiffness of left knee, not elsewhere classified: Secondary | ICD-10-CM | POA: Diagnosis not present

## 2022-03-13 DIAGNOSIS — R262 Difficulty in walking, not elsewhere classified: Secondary | ICD-10-CM | POA: Diagnosis not present

## 2022-03-13 DIAGNOSIS — M1732 Unilateral post-traumatic osteoarthritis, left knee: Secondary | ICD-10-CM | POA: Diagnosis not present

## 2022-03-17 ENCOUNTER — Ambulatory Visit (HOSPITAL_COMMUNITY)
Admission: RE | Admit: 2022-03-17 | Discharge: 2022-03-18 | Disposition: A | Discharge status: Home / Self Care | Payer: PPO | Source: Ambulatory Visit | Attending: Orthopedic Surgery | Admitting: Orthopedic Surgery

## 2022-03-17 ENCOUNTER — Other Ambulatory Visit: Payer: Self-pay

## 2022-03-17 ENCOUNTER — Ambulatory Visit (HOSPITAL_BASED_OUTPATIENT_CLINIC_OR_DEPARTMENT_OTHER): Payer: PPO | Admitting: Certified Registered"

## 2022-03-17 ENCOUNTER — Encounter (HOSPITAL_COMMUNITY)
Admission: RE | Disposition: A | Discharge status: Home / Self Care | Payer: Self-pay | Source: Ambulatory Visit | Attending: Orthopedic Surgery

## 2022-03-17 ENCOUNTER — Encounter (HOSPITAL_COMMUNITY): Payer: Self-pay | Admitting: Orthopedic Surgery

## 2022-03-17 ENCOUNTER — Ambulatory Visit (HOSPITAL_COMMUNITY): Payer: PPO | Admitting: Physician Assistant

## 2022-03-17 DIAGNOSIS — Z950 Presence of cardiac pacemaker: Secondary | ICD-10-CM | POA: Insufficient documentation

## 2022-03-17 DIAGNOSIS — Z87891 Personal history of nicotine dependence: Secondary | ICD-10-CM | POA: Insufficient documentation

## 2022-03-17 DIAGNOSIS — I10 Essential (primary) hypertension: Secondary | ICD-10-CM | POA: Insufficient documentation

## 2022-03-17 DIAGNOSIS — F418 Other specified anxiety disorders: Secondary | ICD-10-CM

## 2022-03-17 DIAGNOSIS — M1712 Unilateral primary osteoarthritis, left knee: Secondary | ICD-10-CM | POA: Diagnosis not present

## 2022-03-17 DIAGNOSIS — E039 Hypothyroidism, unspecified: Secondary | ICD-10-CM | POA: Diagnosis not present

## 2022-03-17 DIAGNOSIS — G8918 Other acute postprocedural pain: Secondary | ICD-10-CM | POA: Diagnosis not present

## 2022-03-17 DIAGNOSIS — K219 Gastro-esophageal reflux disease without esophagitis: Secondary | ICD-10-CM | POA: Diagnosis not present

## 2022-03-17 DIAGNOSIS — M1711 Unilateral primary osteoarthritis, right knee: Secondary | ICD-10-CM | POA: Diagnosis present

## 2022-03-17 DIAGNOSIS — Z96652 Presence of left artificial knee joint: Secondary | ICD-10-CM | POA: Diagnosis not present

## 2022-03-17 HISTORY — PX: TOTAL KNEE ARTHROPLASTY: SHX125

## 2022-03-17 SURGERY — ARTHROPLASTY, KNEE, TOTAL
Anesthesia: Spinal | Site: Knee | Laterality: Left

## 2022-03-17 MED ORDER — ONDANSETRON HCL 4 MG/2ML IJ SOLN: 4.0000 mg | Freq: Four times a day (QID) | INTRAMUSCULAR | Status: DC | PRN

## 2022-03-17 MED ORDER — PROPOFOL 1000 MG/100ML IV EMUL
INTRAVENOUS | Status: AC
  Filled 2022-03-17: qty 100

## 2022-03-17 MED ORDER — POVIDONE-IODINE 10 % EX SWAB: 2.0000 | Freq: Once | CUTANEOUS | Status: DC

## 2022-03-17 MED ORDER — OXYCODONE HCL 5 MG PO TABS: 5.0000 mg | ORAL_TABLET | Freq: Once | ORAL | Status: DC | PRN

## 2022-03-17 MED ORDER — DOCUSATE SODIUM 100 MG PO CAPS: 100.0000 mg | ORAL_CAPSULE | Freq: Two times a day (BID) | ORAL | 0 refills | Status: DC

## 2022-03-17 MED ORDER — LACTATED RINGERS IV SOLN: INTRAVENOUS | Status: DC

## 2022-03-17 MED ORDER — MIDAZOLAM HCL 2 MG/2ML IJ SOLN: 0.5000 mg | Freq: Once | INTRAMUSCULAR | Status: DC | PRN

## 2022-03-17 MED ORDER — HYDROMORPHONE HCL 1 MG/ML IJ SOLN: 0.5000 mg | INTRAMUSCULAR | Status: DC | PRN

## 2022-03-17 MED ORDER — OXYCODONE-ACETAMINOPHEN 5-325 MG PO TABS: 1.0000 | ORAL_TABLET | Freq: Four times a day (QID) | ORAL | 0 refills | Status: DC | PRN

## 2022-03-17 MED ORDER — BUPIVACAINE LIPOSOME 1.3 % IJ SUSP: 20.0000 mL | Freq: Once | INTRAMUSCULAR | Status: DC

## 2022-03-17 MED ORDER — TRANEXAMIC ACID-NACL 1000-0.7 MG/100ML-% IV SOLN
1000.0000 mg | INTRAVENOUS | Status: AC
  Administered 2022-03-17: 1000 mg via INTRAVENOUS
  Filled 2022-03-17: qty 100

## 2022-03-17 MED ORDER — CEFAZOLIN SODIUM-DEXTROSE 2-4 GM/100ML-% IV SOLN
2.0000 g | INTRAVENOUS | Status: AC
  Administered 2022-03-17: 2 g via INTRAVENOUS
  Filled 2022-03-17: qty 100

## 2022-03-17 MED ORDER — PROMETHAZINE HCL 25 MG/ML IJ SOLN: 6.2500 mg | INTRAMUSCULAR | Status: DC | PRN

## 2022-03-17 MED ORDER — BUPIVACAINE LIPOSOME 1.3 % IJ SUSP
INTRAMUSCULAR | Status: DC | PRN
  Administered 2022-03-17: 20 mL

## 2022-03-17 MED ORDER — SODIUM CHLORIDE (PF) 0.9 % IJ SOLN
INTRAMUSCULAR | Status: AC
  Filled 2022-03-17: qty 50

## 2022-03-17 MED ORDER — SODIUM CHLORIDE 0.9 % IV SOLN: INTRAVENOUS | Status: DC

## 2022-03-17 MED ORDER — OXYCODONE HCL 5 MG/5ML PO SOLN: 5.0000 mg | Freq: Once | ORAL | Status: DC | PRN

## 2022-03-17 MED ORDER — 0.9 % SODIUM CHLORIDE (POUR BTL) OPTIME
TOPICAL | Status: DC | PRN
  Administered 2022-03-17: 1000 mL

## 2022-03-17 MED ORDER — LIDOCAINE HCL (PF) 2 % IJ SOLN
INTRAMUSCULAR | Status: AC
  Filled 2022-03-17: qty 5

## 2022-03-17 MED ORDER — PROPOFOL 10 MG/ML IV BOLUS
INTRAVENOUS | Status: DC | PRN
  Administered 2022-03-17: 20 mg via INTRAVENOUS

## 2022-03-17 MED ORDER — AMLODIPINE BESYLATE 5 MG PO TABS
2.5000 mg | ORAL_TABLET | Freq: Every day | ORAL | Status: DC
  Administered 2022-03-17 – 2022-03-18 (×2): 2.5 mg via ORAL
  Filled 2022-03-17 (×2): qty 1

## 2022-03-17 MED ORDER — EPHEDRINE SULFATE-NACL 50-0.9 MG/10ML-% IV SOSY
PREFILLED_SYRINGE | INTRAVENOUS | Status: DC | PRN
  Administered 2022-03-17 (×3): 5 mg via INTRAVENOUS

## 2022-03-17 MED ORDER — POLYETHYLENE GLYCOL 3350 17 G PO PACK: 17.0000 g | PACK | Freq: Every day | ORAL | Status: DC | PRN

## 2022-03-17 MED ORDER — ASPIRIN 325 MG PO TBEC: 325.0000 mg | DELAYED_RELEASE_TABLET | Freq: Two times a day (BID) | ORAL | 0 refills | Status: DC

## 2022-03-17 MED ORDER — ALUM & MAG HYDROXIDE-SIMETH 200-200-20 MG/5ML PO SUSP: 30.0000 mL | ORAL | Status: DC | PRN

## 2022-03-17 MED ORDER — ALPRAZOLAM 0.25 MG PO TABS
0.2500 mg | ORAL_TABLET | Freq: Two times a day (BID) | ORAL | Status: DC | PRN
  Administered 2022-03-17: 0.25 mg via ORAL
  Filled 2022-03-17: qty 1

## 2022-03-17 MED ORDER — PROPOFOL 10 MG/ML IV BOLUS
INTRAVENOUS | Status: AC
  Filled 2022-03-17: qty 20

## 2022-03-17 MED ORDER — FENTANYL CITRATE PF 50 MCG/ML IJ SOSY
50.0000 ug | PREFILLED_SYRINGE | Freq: Once | INTRAMUSCULAR | Status: AC
  Administered 2022-03-17: 100 ug via INTRAVENOUS
  Filled 2022-03-17: qty 2

## 2022-03-17 MED ORDER — CEFAZOLIN SODIUM-DEXTROSE 2-4 GM/100ML-% IV SOLN
2.0000 g | Freq: Four times a day (QID) | INTRAVENOUS | Status: AC
  Administered 2022-03-17 (×2): 2 g via INTRAVENOUS
  Filled 2022-03-17 (×2): qty 100

## 2022-03-17 MED ORDER — DEXAMETHASONE SODIUM PHOSPHATE 10 MG/ML IJ SOLN
INTRAMUSCULAR | Status: AC
  Filled 2022-03-17: qty 1

## 2022-03-17 MED ORDER — PHENYLEPHRINE HCL-NACL 20-0.9 MG/250ML-% IV SOLN
INTRAVENOUS | Status: DC | PRN
  Administered 2022-03-17: 20 ug/min via INTRAVENOUS

## 2022-03-17 MED ORDER — DEXAMETHASONE SODIUM PHOSPHATE 10 MG/ML IJ SOLN
10.0000 mg | Freq: Two times a day (BID) | INTRAMUSCULAR | Status: AC
  Administered 2022-03-17 – 2022-03-18 (×2): 10 mg via INTRAVENOUS
  Filled 2022-03-17 (×2): qty 1

## 2022-03-17 MED ORDER — BUPROPION HCL ER (XL) 300 MG PO TB24
300.0000 mg | ORAL_TABLET | Freq: Every morning | ORAL | Status: DC
  Administered 2022-03-18: 300 mg via ORAL
  Filled 2022-03-17: qty 1

## 2022-03-17 MED ORDER — LIDOCAINE 2% (20 MG/ML) 5 ML SYRINGE
INTRAMUSCULAR | Status: DC | PRN
  Administered 2022-03-17: 40 mg via INTRAVENOUS

## 2022-03-17 MED ORDER — ROPIVACAINE HCL 7.5 MG/ML IJ SOLN
INTRAMUSCULAR | Status: DC | PRN
  Administered 2022-03-17: 20 mL via PERINEURAL

## 2022-03-17 MED ORDER — ACETAMINOPHEN 500 MG PO TABS
1000.0000 mg | ORAL_TABLET | Freq: Once | ORAL | Status: AC
  Administered 2022-03-17: 1000 mg via ORAL
  Filled 2022-03-17: qty 2

## 2022-03-17 MED ORDER — DOCUSATE SODIUM 100 MG PO CAPS
100.0000 mg | ORAL_CAPSULE | Freq: Two times a day (BID) | ORAL | Status: DC
  Administered 2022-03-17 – 2022-03-18 (×2): 100 mg via ORAL
  Filled 2022-03-17 (×2): qty 1

## 2022-03-17 MED ORDER — CELECOXIB 200 MG PO CAPS
200.0000 mg | ORAL_CAPSULE | Freq: Two times a day (BID) | ORAL | Status: DC
  Administered 2022-03-17 – 2022-03-18 (×2): 200 mg via ORAL
  Filled 2022-03-17 (×2): qty 1

## 2022-03-17 MED ORDER — METHOCARBAMOL 500 MG PO TABS
500.0000 mg | ORAL_TABLET | Freq: Four times a day (QID) | ORAL | Status: DC | PRN
  Administered 2022-03-17 – 2022-03-18 (×2): 500 mg via ORAL
  Filled 2022-03-17 (×2): qty 1

## 2022-03-17 MED ORDER — CHLORHEXIDINE GLUCONATE 0.12 % MT SOLN
15.0000 mL | Freq: Once | OROMUCOSAL | Status: AC
  Administered 2022-03-17: 15 mL via OROMUCOSAL

## 2022-03-17 MED ORDER — BUPIVACAINE-EPINEPHRINE (PF) 0.5% -1:200000 IJ SOLN
INTRAMUSCULAR | Status: AC
  Filled 2022-03-17: qty 30

## 2022-03-17 MED ORDER — NEBIVOLOL HCL 5 MG PO TABS
5.0000 mg | ORAL_TABLET | Freq: Every day | ORAL | Status: DC
  Administered 2022-03-17 – 2022-03-18 (×2): 5 mg via ORAL
  Filled 2022-03-17 (×2): qty 1

## 2022-03-17 MED ORDER — SODIUM CHLORIDE 0.9 % IR SOLN
Status: DC | PRN
  Administered 2022-03-17: 1000 mL

## 2022-03-17 MED ORDER — TIZANIDINE HCL 2 MG PO TABS: 2.0000 mg | ORAL_TABLET | Freq: Three times a day (TID) | ORAL | 0 refills | Status: DC | PRN

## 2022-03-17 MED ORDER — BUPIVACAINE LIPOSOME 1.3 % IJ SUSP
INTRAMUSCULAR | Status: AC
  Filled 2022-03-17: qty 20

## 2022-03-17 MED ORDER — METHOCARBAMOL 500 MG IVPB - SIMPLE MED
500.0000 mg | Freq: Four times a day (QID) | INTRAVENOUS | Status: DC | PRN
  Administered 2022-03-17: 500 mg via INTRAVENOUS

## 2022-03-17 MED ORDER — HYDROMORPHONE HCL 1 MG/ML IJ SOLN: 0.2500 mg | INTRAMUSCULAR | Status: DC | PRN

## 2022-03-17 MED ORDER — ONDANSETRON HCL 4 MG/2ML IJ SOLN
INTRAMUSCULAR | Status: DC | PRN
  Administered 2022-03-17: 4 mg via INTRAVENOUS

## 2022-03-17 MED ORDER — TRANEXAMIC ACID-NACL 1000-0.7 MG/100ML-% IV SOLN
1000.0000 mg | Freq: Once | INTRAVENOUS | Status: AC
  Administered 2022-03-17: 1000 mg via INTRAVENOUS
  Filled 2022-03-17: qty 100

## 2022-03-17 MED ORDER — BISACODYL 5 MG PO TBEC: 5.0000 mg | DELAYED_RELEASE_TABLET | Freq: Every day | ORAL | Status: DC | PRN

## 2022-03-17 MED ORDER — DEXAMETHASONE SODIUM PHOSPHATE 10 MG/ML IJ SOLN
INTRAMUSCULAR | Status: DC | PRN
  Administered 2022-03-17: 8 mg via INTRAVENOUS

## 2022-03-17 MED ORDER — LEVOTHYROXINE SODIUM 125 MCG PO TABS
125.0000 ug | ORAL_TABLET | Freq: Every day | ORAL | Status: DC
  Administered 2022-03-18: 125 ug via ORAL
  Filled 2022-03-17: qty 1

## 2022-03-17 MED ORDER — ASPIRIN 325 MG PO TBEC
325.0000 mg | DELAYED_RELEASE_TABLET | Freq: Two times a day (BID) | ORAL | Status: DC
  Administered 2022-03-17 – 2022-03-18 (×2): 325 mg via ORAL
  Filled 2022-03-17 (×2): qty 1

## 2022-03-17 MED ORDER — PROPOFOL 500 MG/50ML IV EMUL
INTRAVENOUS | Status: DC | PRN
  Administered 2022-03-17: 75 ug/kg/min via INTRAVENOUS

## 2022-03-17 MED ORDER — METHOCARBAMOL 500 MG IVPB - SIMPLE MED
INTRAVENOUS | Status: AC
  Filled 2022-03-17: qty 55

## 2022-03-17 MED ORDER — SPIRONOLACTONE 25 MG PO TABS
50.0000 mg | ORAL_TABLET | Freq: Every day | ORAL | Status: DC
  Administered 2022-03-17: 50 mg via ORAL
  Filled 2022-03-17: qty 2

## 2022-03-17 MED ORDER — DIPHENHYDRAMINE HCL 12.5 MG/5ML PO ELIX: 12.5000 mg | ORAL_SOLUTION | ORAL | Status: DC | PRN

## 2022-03-17 MED ORDER — WATER FOR IRRIGATION, STERILE IR SOLN
Status: DC | PRN
  Administered 2022-03-17: 2000 mL

## 2022-03-17 MED ORDER — ONDANSETRON HCL 4 MG PO TABS: 4.0000 mg | ORAL_TABLET | Freq: Four times a day (QID) | ORAL | Status: DC | PRN

## 2022-03-17 MED ORDER — OXYCODONE HCL 5 MG PO TABS
5.0000 mg | ORAL_TABLET | ORAL | Status: DC | PRN
  Administered 2022-03-17 – 2022-03-18 (×5): 5 mg via ORAL
  Filled 2022-03-17 (×5): qty 1

## 2022-03-17 MED ORDER — MAGNESIUM CITRATE PO SOLN: 1.0000 | Freq: Once | ORAL | Status: DC | PRN

## 2022-03-17 MED ORDER — BUPIVACAINE IN DEXTROSE 0.75-8.25 % IT SOLN
INTRATHECAL | Status: DC | PRN
  Administered 2022-03-17: 1.6 mL via INTRATHECAL

## 2022-03-17 MED ORDER — BUPIVACAINE-EPINEPHRINE 0.5% -1:200000 IJ SOLN
INTRAMUSCULAR | Status: DC | PRN
  Administered 2022-03-17: 30 mL

## 2022-03-17 MED ORDER — ONDANSETRON HCL 4 MG/2ML IJ SOLN
INTRAMUSCULAR | Status: AC
  Filled 2022-03-17: qty 2

## 2022-03-17 MED ORDER — SODIUM CHLORIDE 0.9% FLUSH
INTRAVENOUS | Status: DC | PRN
  Administered 2022-03-17: 50 mL

## 2022-03-17 MED ORDER — MIDAZOLAM HCL 2 MG/2ML IJ SOLN
1.0000 mg | Freq: Once | INTRAMUSCULAR | Status: AC
  Administered 2022-03-17: 2 mg via INTRAVENOUS
  Filled 2022-03-17: qty 2

## 2022-03-17 MED ORDER — ORAL CARE MOUTH RINSE: 15.0000 mL | Freq: Once | OROMUCOSAL | Status: AC

## 2022-03-17 MED ORDER — CELECOXIB 200 MG PO CAPS: 200.0000 mg | ORAL_CAPSULE | Freq: Two times a day (BID) | ORAL | 0 refills | Status: AC

## 2022-03-17 MED ORDER — ACETAMINOPHEN 325 MG PO TABS: 325.0000 mg | ORAL_TABLET | Freq: Four times a day (QID) | ORAL | Status: DC | PRN

## 2022-03-17 SURGICAL SUPPLY — 56 items
APL SKNCLS STERI-STRIP NONHPOA (GAUZE/BANDAGES/DRESSINGS) ×1
ATTUNE PSFEM LTSZ4 NARCEM KNEE (Femur) IMPLANT
ATTUNE PSRP INSR SZ4 8 KNEE (Insert) IMPLANT
BAG COUNTER SPONGE SURGICOUNT (BAG) IMPLANT
BAG SPEC THK2 15X12 ZIP CLS (MISCELLANEOUS)
BAG SPNG CNTER NS LX DISP (BAG) ×1
BAG ZIPLOCK 12X15 (MISCELLANEOUS) ×2 IMPLANT
BASEPLATE TIBIAL ROTATING SZ 4 (Knees) IMPLANT
BENZOIN TINCTURE PRP APPL 2/3 (GAUZE/BANDAGES/DRESSINGS) ×2 IMPLANT
BLADE SAGITTAL 25.0X1.19X90 (BLADE) ×2 IMPLANT
BLADE SAW SGTL 13.0X1.19X90.0M (BLADE) ×2 IMPLANT
BLADE SURG SZ10 CARB STEEL (BLADE) ×4 IMPLANT
BNDG CMPR MED 10X6 ELC LF (GAUZE/BANDAGES/DRESSINGS) ×1
BNDG ELASTIC 6X10 VLCR STRL LF (GAUZE/BANDAGES/DRESSINGS) IMPLANT
BNDG ELASTIC 6X5.8 VLCR STR LF (GAUZE/BANDAGES/DRESSINGS) ×2 IMPLANT
BOOTIES KNEE HIGH SLOAN (MISCELLANEOUS) ×2 IMPLANT
BOWL SMART MIX CTS (DISPOSABLE) ×2 IMPLANT
BSPLAT TIB 4 CMNT ROT PLAT STR (Knees) ×1 IMPLANT
CEMENT HV SMART SET (Cement) ×4 IMPLANT
CLSR STERI-STRIP ANTIMIC 1/2X4 (GAUZE/BANDAGES/DRESSINGS) IMPLANT
COVER SURGICAL LIGHT HANDLE (MISCELLANEOUS) ×2 IMPLANT
CUFF TOURN SGL QUICK 34 (TOURNIQUET CUFF) ×1
CUFF TRNQT CYL 34X4.125X (TOURNIQUET CUFF) ×2 IMPLANT
DRAPE INCISE IOBAN 66X45 STRL (DRAPES) ×2 IMPLANT
DRAPE U-SHAPE 47X51 STRL (DRAPES) ×2 IMPLANT
DRSG AQUACEL AG ADV 3.5X10 (GAUZE/BANDAGES/DRESSINGS) ×2 IMPLANT
DURAPREP 26ML APPLICATOR (WOUND CARE) ×2 IMPLANT
ELECT REM PT RETURN 15FT ADLT (MISCELLANEOUS) ×2 IMPLANT
GLOVE BIOGEL PI IND STRL 8 (GLOVE) ×4 IMPLANT
GLOVE ECLIPSE 7.5 STRL STRAW (GLOVE) ×4 IMPLANT
GOWN STRL REUS W/ TWL XL LVL3 (GOWN DISPOSABLE) ×4 IMPLANT
GOWN STRL REUS W/TWL XL LVL3 (GOWN DISPOSABLE) ×2
HANDPIECE INTERPULSE COAX TIP (DISPOSABLE) ×1
HOLDER FOLEY CATH W/STRAP (MISCELLANEOUS) IMPLANT
HOOD PEEL AWAY T7 (MISCELLANEOUS) ×6 IMPLANT
KIT TURNOVER KIT A (KITS) IMPLANT
MANIFOLD NEPTUNE II (INSTRUMENTS) ×2 IMPLANT
NEEDLE HYPO 22GX1.5 SAFETY (NEEDLE) ×4 IMPLANT
NS IRRIG 1000ML POUR BTL (IV SOLUTION) ×2 IMPLANT
PACK TOTAL KNEE CUSTOM (KITS) ×2 IMPLANT
PADDING CAST COTTON 6X4 STRL (CAST SUPPLIES) ×2 IMPLANT
PATELLA MEDIAL ATTUN 35MM KNEE (Knees) IMPLANT
PIN STEINMAN FIXATION KNEE (PIN) IMPLANT
PROTECTOR NERVE ULNAR (MISCELLANEOUS) ×2 IMPLANT
SET HNDPC FAN SPRY TIP SCT (DISPOSABLE) ×2 IMPLANT
SPIKE FLUID TRANSFER (MISCELLANEOUS) ×4 IMPLANT
STRIP CLOSURE SKIN 1/2X4 (GAUZE/BANDAGES/DRESSINGS) IMPLANT
SUT MNCRL AB 3-0 PS2 18 (SUTURE) ×2 IMPLANT
SUT VIC AB 0 CT1 36 (SUTURE) ×2 IMPLANT
SUT VIC AB 1 CT1 36 (SUTURE) ×4 IMPLANT
SUT VIC AB 2-0 CT1 27 (SUTURE) ×1
SUT VIC AB 2-0 CT1 TAPERPNT 27 (SUTURE) IMPLANT
SYR CONTROL 10ML LL (SYRINGE) ×4 IMPLANT
TRAY FOLEY MTR SLVR 16FR STAT (SET/KITS/TRAYS/PACK) ×2 IMPLANT
WATER STERILE IRR 1000ML POUR (IV SOLUTION) ×4 IMPLANT
WRAP KNEE MAXI GEL POST OP (GAUZE/BANDAGES/DRESSINGS) ×2 IMPLANT

## 2022-03-17 NOTE — Anesthesia Procedure Notes (Signed)
Spinal  Patient location during procedure: OR Start time: 03/17/2022 10:14 AM Reason for block: surgical anesthesia Staffing Performed: resident/CRNA  Anesthesiologist: Annye Asa, MD Resident/CRNA: Eben Burow, CRNA Performed by: Eben Burow, CRNA Authorized by: Annye Asa, MD   Preanesthetic Checklist Completed: patient identified, IV checked, site marked, risks and benefits discussed, surgical consent, monitors and equipment checked, pre-op evaluation and timeout performed Spinal Block Patient position: sitting Prep: DuraPrep and site prepped and draped Patient monitoring: continuous pulse ox, blood pressure, cardiac monitor and heart rate Approach: midline Location: L3-4 Injection technique: single-shot Needle Needle type: Pencan  Needle gauge: 24 G Needle length: 10 cm Assessment Events: CSF return Additional Notes Pt placed in sitting position, spinal kit expiration date checked and verified, + CSF, - heme, pt tolerated well. Dr Glennon Mac present and supervising throughout SAB placement. Adequate sensory level.

## 2022-03-17 NOTE — Anesthesia Procedure Notes (Signed)
Anesthesia Regional Block: Adductor canal block   Pre-Anesthetic Checklist: , timeout performed,  Correct Patient, Correct Site, Correct Laterality,  Correct Procedure, Correct Position, site marked,  Risks and benefits discussed,  Surgical consent,  Pre-op evaluation,  At surgeon's request and post-op pain management  Laterality: Left and Lower  Prep: chloraprep       Needles:  Injection technique: Single-shot  Needle Type: Echogenic Needle     Needle Length: 9cm  Needle Gauge: 21     Additional Needles:   Procedures:,,,, ultrasound used (permanent image in chart),,    Narrative:  Start time: 03/17/2022 9:02 AM End time: 03/17/2022 9:08 AM Injection made incrementally with aspirations every 5 mL.  Performed by: Personally  Anesthesiologist: Annye Asa, MD  Additional Notes: Pt identified in Holding room.  Monitors applied. Working IV access confirmed. Sterile prep L thigh.  #21ga ECHOgenic Arrow block needle into adductor canal with US guidance.  20cc 0.75% Ropivacaine injected incrementally after negative test dose.  Patient asymptomatic, VSS, no heme aspirated, tolerated well.   Jenita Seashore, MD

## 2022-03-17 NOTE — Discharge Instructions (Signed)

## 2022-03-17 NOTE — Progress Notes (Signed)
Orthopedic Tech Progress Note Patient Details:  Cindy [name] Dec 19, 1945 [id number] Unable to apply a CPM to this patient's left knee due to her pain level.  Patient ID: [name], female   DOB: [birth year], 76 y.o.   MRN: [id number]  Jearld Lesch 03/17/2022, 4:32 PM

## 2022-03-17 NOTE — Anesthesia Preprocedure Evaluation (Addendum)
Anesthesia Evaluation  Patient identified by MRN, date of birth, ID band Patient awake    Reviewed: Allergy & Precautions, NPO status , Patient's Chart, lab work & pertinent test results, reviewed documented beta blocker date and time   History of Anesthesia Complications Negative for: history of anesthetic complications  Airway Mallampati: II  TM Distance: >3 FB Neck ROM: Full    Dental  (+) Dental Advisory Given   Pulmonary former smoker   breath sounds clear to auscultation       Cardiovascular hypertension, Pt. on medications and Pt. on home beta blockers (-) angina + pacemaker (for symptomatic pauses)  Rhythm:Regular Rate:Normal  Cath 2015 demonstrated normal coronary arteries with normal LV function    Neuro/Psych   Anxiety Depression    negative neurological ROS     GI/Hepatic Neg liver ROS,GERD  Controlled,,  Endo/Other  Hypothyroidism    Renal/GU negative Renal ROS     Musculoskeletal  (+) Arthritis ,    Abdominal   Peds  Hematology negative hematology ROS (+)   Anesthesia Other Findings   Reproductive/Obstetrics                             Anesthesia Physical Anesthesia Plan  ASA: 3  Anesthesia Plan: Spinal   Post-op Pain Management: Regional block*   Induction:   PONV Risk Score and Plan: 2 and Ondansetron and Treatment may vary due to age or medical condition  Airway Management Planned: Natural Airway and Simple Face Mask  Additional Equipment: None  Intra-op Plan:   Post-operative Plan:   Informed Consent: I have reviewed the patients History and Physical, chart, labs and discussed the procedure including the risks, benefits and alternatives for the proposed anesthesia with the patient or authorized representative who has indicated his/her understanding and acceptance.     Dental advisory given  Plan Discussed with: CRNA and Surgeon  Anesthesia Plan  Comments: (Plan routine monitors, SAB with adductor canal block for post op analgesia)        Anesthesia Quick Evaluation

## 2022-03-17 NOTE — Op Note (Signed)
PATIENT ID:      Cindy [name]  MRN:     [id number] DOB/AGE:    [birth year] / 76 y.o.       OPERATIVE REPORT   DATE OF PROCEDURE:  03/17/2022      PREOPERATIVE DIAGNOSIS:   LEFT KNEE DEGENERATIVE JOINT DISEASE      Estimated body mass index is 29.08 kg/m as calculated from the following:   Height as of this encounter: '5\' 2"'$  (1.575 m).   Weight as of this encounter: 72.1 kg.                                                       POSTOPERATIVE DIAGNOSIS:   Same                                                                  PROCEDURE:  Procedure(s): TOTAL KNEE ARTHROPLASTY Using DepuyAttune RP implants #4N Femur, #4Tibia, 8 mm Attune RP bearing, 35 Patella    SURGEON: Alta Corning  ASSISTANT:   Gaspar Skeeters PA-C   (Present and scrubbed throughout the case, critical for assistance with exposure, retraction, instrumentation, and closure.)        ANESTHESIA: spinal, 20cc Exparel, 50cc 0.25% Marcaine EBL: min cc FLUID REPLACEMENT: unk cc crystaloid TOURNIQUET: DRAINS: None TRANEXAMIC ACID: 1gm IV, 2gm topical COMPLICATIONS:  None         INDICATIONS FOR PROCEDURE: The patient has  LEFT KNEE DEGENERATIVE JOINT DISEASE, mild varus deformities, XR shows bone on bone arthritis, lateral subluxation of tibia. Patient has failed all conservative measures including anti-inflammatory medicines, narcotics, attempts at exercise and weight loss, cortisone injections and viscosupplementation.  Risks and benefits of surgery have been discussed, questions answered.   DESCRIPTION OF PROCEDURE: The patient identified by armband, received  IV antibiotics, in the holding area at [facility]. Patient taken to the operating room, appropriate anesthetic monitors were attached, and spinal anesthesia was  induced. IV Tranexamic acid was given.Tourniquet applied high to the operative thigh. Lateral post and foot positioner applied to the table, the lower extremity was then prepped and draped in usual  sterile fashion from the toes to the tourniquet. Time-out procedure was performed. Zenda Alpers, was present and scrubbed throughout the case, critical for assistance with, positioning, exposure, retraction, instrumentation, and closure.The skin and subcutaneous tissue along the incision was injected with 20 cc of a mixture of Exparel and Marcaine solution, using a 20-gauge by 1-1/2 inch needle. We began the operation, with the knee flexed 130 degrees, by making the anterior midline incision starting at handbreadth above the patella going over the patella 1 cm medial to and 4 cm distal to the tibial tubercle. Small bleeders in the skin and the subcutaneous tissue identified and cauterized. Transverse retinaculum was incised and reflected medially and a medial parapatellar arthrotomy was accomplished. the patella was everted and theprepatellar fat pad resected. The superficial medial collateral ligament was then elevated from anterior to posterior along the proximal flare of the tibia and anterior half of the menisci resected. The knee was hyperflexed exposing bone on bone arthritis.  Peripheral and notch osteophytes as well as the cruciate ligaments were then resected. We continued to work our way around posteriorly along the proximal tibia, and externally rotated the tibia subluxing it out from underneath the femur. A McHale PCL retractor was placed through the notch and a lateral Hohmann retractor placed, and we then entered the proximal tibia in line with the Depuy starter drill in line with the axis of the tibia followed by an intramedullary guide rod and 0-degree posterior slope cutting guide. The tibial cutting guide, 4 degree posterior sloped, was pinned into place allowing resection of 2 mm of bone medially and 10 mm of bone laterally. Satisfied with the tibial resection, we then entered the distal femur 2 mm anterior to the PCL origin with the intramedullary guide rod and applied the distal femoral cutting  guide set at 9 mm, with 5 degrees of valgus. This was pinned along the epicondylar axis. At this point, the distal femoral cut was accomplished without difficulty. We then sized for a #4N femoral component and pinned the guide in 3 degrees of external rotation. The chamfer cutting guide was pinned into place. The anterior, posterior, and chamfer cuts were accomplished without difficulty followed by the Attune RP box cutting guide and the box cut. We also removed posterior osteophytes from the posterior femoral condyles. The posterior capsule was injected with Exparel solution. The knee was brought into full extension. We checked our extension gap and fit a 8 mm bearing. Distracting in extension with a lamina spreader,  bleeders in the posterior capsule, Posterior medial and posterior lateral gutter were cauterized.  The transexamic acid-soaked sponge was then placed in the gap of the knee in extension. The knee was flexed 30. The posterior patella cut was accomplished with the 9.5 mm Attune cutting guide, sized for a 28m dome, and the fixation pegs drilled.The knee was then once again hyperflexed exposing the proximal tibia. We sized for a # 4 tibial base plate, applied the smokestack and the conical reamer followed by the the Delta fin keel punch. We then hammered into place the Attune RP trial femoral component, drilled the lugs, inserted a  8 mm trial bearing, trial patellar button, and took the knee through range of motion from 0-130 degrees. Medial and lateral ligamentous stability was checked. No thumb pressure was required for patellar Tracking. The tourniquet was released at 487m. All trial components were removed, mating surfaces irrigated with pulse lavage, and dried with suction and sponges. 10 cc of the Exparel solution was applied to the cancellus bone of the patella distal femur and proximal tibia.  After waiting 30 seconds, the bony surfaces were again, dried with sponges. A double batch of DePuy  HV cement was mixed and applied to all bony metallic mating surfaces except for the posterior condyles of the femur itself. In order, we hammered into place the tibial tray and removed excess cement, the femoral component and removed excess cement. The final Attune RP bearing was inserted, and the knee brought to full extension with compression. The patellar button was clamped into place, and excess cement removed. The knee was held at 30 flexion with compression, while the cement cured. The wound was irrigated out with normal saline solution pulse lavage. The rest of the Exparel was injected into the parapatellar arthrotomy, subcutaneous tissues, and periosteal tissues. The parapatellar arthrotomy was closed with running #1 Vicryl suture. The subcutaneous tissue with 3-0 undyed Vicryl suture, and the skin with running 3-0 SQ vicryl. An  Aquacil and Ace wrap were applied. The patient was taken to recovery room without difficulty.   Alta Corning 03/17/2022, 11:28 AM

## 2022-03-17 NOTE — H&P (Signed)
TOTAL KNEE ADMISSION H&P  Patient is being admitted for left total knee arthroplasty.  Subjective:  Chief Complaint:left knee pain.  HPI: Cindy [name], 76 y.o. female, has a history of pain and functional disability in the left knee due to arthritis and has failed non-surgical conservative treatments for greater than 12 weeks to includeNSAID's and/or analgesics, corticosteriod injections, viscosupplementation injections, flexibility and strengthening excercises, use of assistive devices, weight reduction as appropriate, and activity modification.  Onset of symptoms was gradual, starting 5 years ago with gradually worsening course since that time. The patient noted prior procedures on the knee to include  arthroscopy and menisectomy on the left knee(s).  Patient currently rates pain in the left knee(s) at 10 out of 10 with activity. Patient has night pain, worsening of pain with activity and weight bearing, pain that interferes with activities of daily living, pain with passive range of motion, and joint swelling.  Patient has evidence of subchondral cysts, subchondral sclerosis, periarticular osteophytes, joint subluxation, and joint space narrowing by imaging studies. This patient has had  failure of all reasonable conservative care . There is no active infection.  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   Anemia, iron deficiency 05/07/2015   Chemical diabetes 05/07/2015   Osteopenia 05/07/2015   Neurocardiogenic syncope 05/07/2015   Unable to lose weight 04/24/2014   Lumbar radiculopathy, chronic 01/03/2014   Abnormal nuclear stress test 12/15/2013   Hyperthyroidism 10/06/2011   Syncope 08/20/2010   Pacemaker -Biotronik-CLS 08/20/2010   Essential hypertension 04/15/2010   PVC (premature  ventricular contraction) 04/15/2010   GERD 04/15/2010   Postablative hypothyroidism    Past Medical History:  Diagnosis Date   Abnormal nuclear stress test 12/15/2013   Anemia    hx iron def, no GI loss indentifed - resolved with diet change     Anxiety    Arthritis    Degenerative disc disease, cervical    Depression    Dysrhythmia    GERD (gastroesophageal reflux disease)    Maynor Mwangi' DISEASE 2013   I-131 ablation, now post tx hypothyroid state   History of hiatal hernia    HYPERTENSION    Impaired glucose tolerance test    Keloid 12/31/2010   incisional    Pacemaker -Biotronik-CLS 08/20/2010   Fainted easily   Postablative hypothyroidism    Qualifier: Diagnosis of  By: Joya Gaskins CMA, [place]     Pre-diabetes    Sciatica    Syncope 08/20/2010   s/p PPM 08/2010 for sxc pause on loop recorder causing same   Unspecified vitamin D deficiency     Past Surgical History:  Procedure Laterality Date   APPENDECTOMY     blephroplasty     BREAST BIOPSY Left 2001   guided excisional bx (L) breast microcalcification at 12 o'clock- Benign   BUNIONECTOMY Left    CATARACT EXTRACTION  2014   FRACTURE SURGERY     ORIF 06-2010   HIATAL HERNIA REPAIR N/A 04/10/2020   Procedure: LAPAROSCOPIC REPAIR OF TYPE 3 HIATAL HERNIA WITH NISSEN FUNDOPLICATION;  Surgeon: Johnathan Hausen, MD;  Location: [facility];  Service: General;  Laterality: N/A;   KNEE ARTHROSCOPY Left 10/28/2021   Procedure: ARTHROSCOPY KNEE;  Surgeon: Dorna Leitz, MD;  Location: [facility];  Service: Orthopedics;  Laterality: Left;   LEFT HEART CATHETERIZATION WITH CORONARY ANGIOGRAM N/A 12/22/2013  Procedure: LEFT HEART CATHETERIZATION WITH CORONARY ANGIOGRAM;  Surgeon: Sinclair Grooms, MD;  Location[facility] C[facility] LAB;  Service: Cardiovascular;  Laterality: N/A;   PACEMAKER INSERTION     Right ankle  06/2010   Trimalleolar fracture   TOTAL SHOULDER ARTHROPLASTY Left 01/27/2019   Procedure: TOTAL SHOULDER ARTHROPLASTY;  Surgeon: Tania Ade, MD;  Location: [facility];  Service: Orthopedics;  Laterality: Left;    No current facility-administered medications for this encounter.   Current Outpatient Medications  Medication Sig Dispense Refill Last Dose   ALPRAZolam (XANAX) 0.5 MG tablet Take 0.25 mg by mouth 2 (two) times daily as needed for anxiety or sleep.  0    amLODipine (NORVASC) 5 MG tablet TAKE 1 TABLET BY MOUTH EVERY DAY (Patient taking differently: Take 2.5 mg by mouth daily.) 90 tablet 1    buPROPion (WELLBUTRIN XL) 300 MG 24 hr tablet Take 300 mg by mouth every morning.      levothyroxine (SYNTHROID) 125 MCG tablet Take 125 mcg by mouth daily before breakfast.      nebivolol (BYSTOLIC) 5 MG tablet Take 5 mg by mouth daily.      spironolactone (ALDACTONE) 50 MG tablet Take 50 mg by mouth daily.      Cholecalciferol (VITAMIN D3 PO) Take 1 tablet by mouth daily.      HYDROcodone-acetaminophen (NORCO/VICODIN) 5-325 MG tablet Take 1 tablet by mouth every 4 (four) hours as needed for moderate pain. (Patient not taking: Reported on 03/10/2022) 20 tablet 0 Not Taking   meloxicam (MOBIC) 15 MG tablet Take 15 mg by mouth daily as needed for pain.      Multiple Vitamins-Minerals (EQ MULTIVITAMINS ADULT GUMMY PO) Take 2 tablets by mouth daily in the afternoon.      Allergies  Allergen Reactions   Ace Inhibitors Swelling   Lisinopril Swelling    angiodema   Codeine Nausea And Vomiting    Social History   Tobacco Use   Smoking status: Former    Types: Cigarettes    Quit date: 04/08/1967    Years since quitting: 54.9   Smokeless tobacco: Never  Substance Use Topics   Alcohol use: Yes    Alcohol/week: 2.0 standard drinks of alcohol    Types: 1 Glasses of wine, 1 Cans of beer per week    Comment: OCCASIONAL    Family History  Problem Relation Age of Onset   Rheum arthritis Mother    Emphysema Mother    Hypertension Mother    Osteoporosis Mother    COPD Mother    Prostate cancer Father    Hypertension Father     Heart attack Father    Osteoporosis Maternal Grandmother    Stroke Other      Review of Systems ROS: I have reviewed the patient's review of systems thoroughly and there are no positive responses as relates to the HPI.  Objective:  Physical Exam  Vital signs in last 24 hours:   Well-developed well-nourished patient in no acute distress. Alert and oriented x3 HEENT:within normal limits Cardiac: Regular rate and rhythm Pulmonary: Lungs clear to auscultation Abdomen: Soft and nontender.  Normal active bowel sounds  Musculoskeletal: (Left knee: Painful range of motion.  Limited range of motion.  No instability.  Trace effusion.)  Labs: Recent Results (from the past 2160 hour(s))  Surgical pcr screen     Status: None   Collection Time: 03/11/22 11:00 AM   Specimen: Nasal Mucosa; Nasal Swab  Result Value Ref Range  MRSA, PCR NEGATIVE NEGATIVE   Staphylococcus aureus NEGATIVE NEGATIVE    Comment: (NOTE) The Xpert SA Assay (FDA approved for NASAL specimens in patients 3 years of age and older), is one component of a comprehensive surveillance program. It is not intended to diagnose infection nor to guide or monitor treatment. Performed at [facility], [place] [address] [place], [place] [address]   Glucose, capillary     Status: Abnormal   Collection Time: 03/11/22 11:27 AM  Result Value Ref Range   Glucose-Capillary 113 (H) 70 - 99 mg/dL    Comment: Glucose reference range applies only to samples taken after fasting for at least 8 hours.  Hemoglobin A1c per protocol     Status: Abnormal   Collection Time: 03/11/22 11:30 AM  Result Value Ref Range   Hgb A1c MFr Bld 5.9 (H) 4.8 - 5.6 %    Comment: (NOTE)         Prediabetes: 5.7 - 6.4         Diabetes: >6.4         Glycemic control for adults with diabetes: <7.0    Mean Plasma Glucose 123 mg/dL    Comment: (NOTE) Performed At: [facility] [place], [place] [id number] Rush Farmer MD [id number]   Basic metabolic panel per protocol     Status: Abnormal   Collection Time: 03/11/22 11:30 AM  Result Value Ref Range   Sodium 139 135 - 145 mmol/L   Potassium 4.3 3.5 - 5.1 mmol/L   Chloride 105 98 - 111 mmol/L   CO2 26 22 - 32 mmol/L   Glucose, Bld 103 (H) 70 - 99 mg/dL    Comment: Glucose reference range applies only to samples taken after fasting for at least 8 hours.   BUN 24 (H) 8 - 23 mg/dL   Creatinine, Ser 1.09 (H) 0.44 - 1.00 mg/dL   Calcium 9.5 8.9 - 10.3 mg/dL   GFR, Estimated 53 (L) >60 mL/min    Comment: (NOTE) Calculated using the CKD-EPI Creatinine Equation (2021)    Anion gap 8 5 - 15    Comment: Performed at [facility], [place] [address] [place], [place] [address]  CBC per protocol     Status: None   Collection Time: 03/11/22 11:30 AM  Result Value Ref Range   WBC 5.5 4.0 - 10.5 K/uL   RBC 4.71 3.87 - 5.11 MIL/uL   Hemoglobin 14.5 12.0 - 15.0 g/dL   HCT 44.3 36.0 - 46.0 %   MCV 94.1 80.0 - 100.0 fL   MCH 30.8 26.0 - 34.0 pg   MCHC 32.7 30.0 - 36.0 g/dL   RDW 12.7 11.5 - 15.5 %   Platelets 298 150 - 400 K/uL   nRBC 0.0 0.0 - 0.2 %    Comment: Performed at [facility], [place] [address] [place], [place] [address]     Estimated body mass index is 29.08 kg/m as calculated from the following:   Height as of 03/11/22: '5\' 2"'$  (1.575 m).   Weight as of 03/11/22: 72.1 kg.   Imaging Review Plain radiographs demonstrate severe degenerative joint disease of the left knee(s). The overall alignment ismild varus. The bone quality appears to be fair for age and reported activity level.      Assessment/Plan:  End stage arthritis, left knee   The patient history, physical examination, clinical judgment of the provider and imaging studies are consistent with end stage degenerative  joint disease of the left knee(s) and total knee arthroplasty is deemed medically necessary. The treatment options including  medical management, injection therapy arthroscopy and arthroplasty were discussed at length. The risks and benefits of total knee arthroplasty were presented and reviewed. The risks due to aseptic loosening, infection, stiffness, patella tracking problems, thromboembolic complications and other imponderables were discussed. The patient acknowledged the explanation, agreed to proceed with the plan and consent was signed. Patient is being admitted for inpatient treatment for surgery, pain control, PT, OT, prophylactic antibiotics, VTE prophylaxis, progressive ambulation and ADL's and discharge planning. The patient is planning to be discharged home with home health services     Patient's anticipated LOS is less than 2 midnights, meeting these requirements: - Younger than 47 - Lives within 1 hour of care - Has a competent adult at home to recover with post-op recover - NO history of  - Chronic pain requiring opiods  - Diabetes  - Coronary Artery Disease  - Heart failure  - Heart attack  - Stroke  - DVT/VTE  - Cardiac arrhythmia  - Respiratory Failure/COPD  - Renal failure  - Anemia  - Advanced Liver disease

## 2022-03-17 NOTE — Progress Notes (Signed)
Orthopedic Tech Progress Note Patient Details:  ROBINA HAMOR 24-Feb-1946 [id number] Patient is not in the correct bed to have a CPM applied at this moment. Ortho Devices Type of Ortho Device: Bone foam zero knee Ortho Device/Splint Location: LLE Ortho Device/Splint Interventions: Application   Post Interventions Patient Tolerated: Well  Linus Salmons Kensley Lares 03/17/2022, 12:33 PM

## 2022-03-17 NOTE — Anesthesia Postprocedure Evaluation (Signed)
Anesthesia Post Note  Patient: Cindy [name]  Procedure(s) Performed: TOTAL KNEE ARTHROPLASTY (Left: Knee)     Patient location during evaluation: PACU Anesthesia Type: Spinal Level of consciousness: awake and alert, patient cooperative and oriented Pain management: pain level controlled Vital Signs Assessment: post-procedure vital signs reviewed and stable Respiratory status: spontaneous breathing, respiratory function stable and nonlabored ventilation Cardiovascular status: blood pressure returned to baseline and stable Postop Assessment: spinal receding and no apparent nausea or vomiting Anesthetic complications: no   No notable events documented.  Last Vitals:  Vitals:   03/17/22 1215 03/17/22 1230  BP: (!) 112/52 99/87  Pulse: (!) 57 60  Resp: 17 13  Temp:    SpO2: 100% 95%    Last Pain:  Vitals:   03/17/22 1230  PainSc: 0-No pain                 Tadarrius Burch,E. Cayleb Jarnigan

## 2022-03-17 NOTE — Evaluation (Signed)
Physical Therapy Evaluation Patient Details Name: Cindy [name] MRN: [id number] DOB: 11/07/1945 Today's Date: 03/17/2022  History of Present Illness  Pt is a 76yo female presenting s/p L-TKA on 03/17/22. PMH: GERD, HTN, pacemaker, DM, sciatica, Syncope, L-TSA 2020.   Clinical Impression  Cindy [name] is a 76 y.o. female POD 0 s/p L-TKA. Patient reports modified independence using SPC with mobility at baseline. Patient is now limited by functional impairments (see PT problem list below) and requires min guard for bed mobility and for transfers, further mobility deferred secondary to pt's fear of weightbearing on RLE despite encouragement. Patient instructed in exercise to facilitate ROM and circulation to manage edema. Provided incentive spirometer and with Vcs pt able to achieve 1522m. Patient will benefit from continued skilled PT interventions to address impairments and progress towards PLOF. Acute PT will follow to progress mobility and stair training in preparation for safe discharge home.       Recommendations for follow up therapy are one component of a multi-disciplinary discharge planning process, led by the attending physician.  Recommendations may be updated based on patient status, additional functional criteria and insurance authorization.  Follow Up Recommendations Follow physician's recommendations for discharge plan and follow up therapies      Assistance Recommended at Discharge Frequent or constant Supervision/Assistance  Patient can return home with the following  A little help with walking and/or transfers;A little help with bathing/dressing/bathroom;Assistance with cooking/housework;Assist for transportation;Help with stairs or ramp for entrance    Equipment Recommendations None recommended by PT  Recommendations for Other Services       Functional Status Assessment Patient has had a recent decline in their functional status and demonstrates the ability to  make significant improvements in function in a reasonable and predictable amount of time.     Precautions / Restrictions Precautions Precautions: Fall Restrictions Weight Bearing Restrictions: No Other Position/Activity Restrictions: wbat      Mobility  Bed Mobility Overal bed mobility: Needs Assistance Bed Mobility: Supine to Sit     Supine to sit: Min guard, HOB elevated     General bed mobility comments: For safety only, no physical assist required, use of bed rails.    Transfers Overall transfer level: Needs assistance Equipment used: Rolling [place] (2 wheels) Transfers: Sit to/from Stand, Bed to chair/wheelchair/BSC Sit to Stand: Min guard   Step pivot transfers: Min guard       General transfer comment: For safety only, VCs for sequencing and powering up from bed using BUE. VCs for sequencing during step pivot transfer. Pt  highly fearful of placing weight through RLE but did so with moderate encouragement, joking "can't I just hop on my good leg"    Ambulation/Gait               General Gait Details: deferred  Stairs            Wheelchair Mobility    Modified Rankin (Stroke Patients Only)       Balance Overall balance assessment: Needs assistance Sitting-balance support: Feet supported, No upper extremity supported Sitting balance-Leahy Scale: Good     Standing balance support: Reliant on assistive device for balance, During functional activity, Bilateral upper extremity supported Standing balance-Leahy Scale: Poor                               Pertinent Vitals/Pain Pain Assessment Pain Assessment: 0-10 Pain Score: 4  Pain Location: left thigh Pain  Descriptors / Indicators: Operative site guarding Pain Intervention(s): Limited activity within patient's tolerance, Monitored during session, Ice applied, Repositioned    Home Living Family/patient expects to be discharged to:: Private residence Living Arrangements:  Spouse/significant other Available Help at Discharge: Family;Available 24 hours/day Type of Home: House Home Access: Stairs to enter Entrance Stairs-Rails: Left Entrance Stairs-Number of Steps: 2   Home Layout: One level Home Equipment: Air cabin crew (4 wheels);Rolling [place] (2 wheels)      Prior Function Prior Level of Function : Independent/Modified Independent             Mobility Comments: SPC for community mobilty, furniture surfs at home ADLs Comments: IND     Hand Dominance   Dominant Hand: Right    Extremity/Trunk Assessment   Upper Extremity Assessment Upper Extremity Assessment: Overall WFL for tasks assessed    Lower Extremity Assessment Lower Extremity Assessment: RLE deficits/detail;LLE deficits/detail RLE Deficits / Details: MMT ank DF/PF 5/5 RLE Sensation: WNL LLE Deficits / Details: MMT ank DF/PF 3+/5, no extensor lag noted LLE Sensation: decreased light touch    Cervical / Trunk Assessment Cervical / Trunk Assessment: Kyphotic  Communication   Communication: No difficulties  Cognition Arousal/Alertness: Awake/alert Behavior During Therapy: WFL for tasks assessed/performed Overall Cognitive Status: Within Functional Limits for tasks assessed                                          General Comments General comments (skin integrity, edema, etc.): Husband Collier Salina present    Exercises Total Joint Exercises Ankle Circles/Pumps: AROM, Both, 20 reps   Assessment/Plan    PT Assessment Patient needs continued PT services  PT Problem List Decreased strength;Decreased range of motion;Decreased activity tolerance;Decreased balance;Decreased mobility;Decreased coordination;Pain       PT Treatment Interventions DME instruction;Gait training;Stair training;Functional mobility training;Therapeutic activities;Therapeutic exercise;Balance training;Neuromuscular re-education;Patient/family education    PT Goals (Current goals can  be found in the Care Plan section)  Acute Rehab PT Goals Patient Stated Goal: Walking the dog PT Goal Formulation: With patient Time For Goal Achievement: 03/24/22 Potential to Achieve Goals: Good    Frequency 7X/week     Co-evaluation               AM-PAC PT "6 Clicks" Mobility  Outcome Measure Help needed turning from your back to your side while in a flat bed without using bedrails?: None Help needed moving from lying on your back to sitting on the side of a flat bed without using bedrails?: A Little Help needed moving to and from a bed to a chair (including a wheelchair)?: A Little Help needed standing up from a chair using your arms (e.g., wheelchair or bedside chair)?: A Little Help needed to walk in hospital room?: A Little Help needed climbing 3-5 steps with a railing? : A Little 6 Click Score: 19    End of Session Equipment Utilized During Treatment: Gait belt Activity Tolerance: Patient tolerated treatment well Patient left: in chair;with call bell/phone within reach;with chair alarm set;with family/visitor present;with SCD's reapplied Nurse Communication: Mobility status;Patient requests pain meds PT Visit Diagnosis: Pain;Difficulty in walking, not elsewhere classified (R26.2) Pain - Right/Left: Right Pain - part of body: Knee    Time: 7628-3151 PT Time Calculation (min) (ACUTE ONLY): 18 min   Charges:   PT Evaluation $PT Eval Low Complexity: 1 Low  Coolidge Breeze, PT, DPT [facility] Office: [phone] Weekend pager: [phone]  Coolidge Breeze 03/17/2022, 3:45 PM

## 2022-03-17 NOTE — Anesthesia Procedure Notes (Signed)
Procedure Name: MAC Date/Time: 03/17/2022 10:08 AM  Performed by: Eben Burow, CRNAPre-anesthesia Checklist: Patient identified, Emergency Drugs available, Suction available, Patient being monitored and Timeout performed Oxygen Delivery Method: Simple face mask Placement Confirmation: positive ETCO2

## 2022-03-17 NOTE — Plan of Care (Signed)
  Problem: Education: Goal: Knowledge of General Education information will improve Description: Including pain rating scale, medication(s)/side effects and non-pharmacologic comfort measures Outcome: Progressing   Problem: Activity: Goal: Risk for activity intolerance will decrease Outcome: Progressing   Problem: Nutrition: Goal: Adequate nutrition will be maintained Outcome: Progressing   Problem: Elimination: Goal: Will not experience complications related to bowel motility Outcome: Progressing   Problem: Pain Managment: Goal: General experience of comfort will improve Outcome: Progressing   Problem: Education: Goal: Knowledge of the prescribed therapeutic regimen will improve Outcome: Progressing   Problem: Pain Management: Goal: Pain level will decrease with appropriate interventions Outcome: Progressing

## 2022-03-17 NOTE — Transfer of Care (Signed)
Immediate Anesthesia Transfer of Care Note  Patient: Cindy [name]  Procedure(s) Performed: TOTAL KNEE ARTHROPLASTY (Left: Knee)  Patient Location: PACU  Anesthesia Type:Spinal  Level of Consciousness: awake, alert , and patient cooperative  Airway & Oxygen Therapy: Patient Spontanous Breathing and Patient connected to face mask oxygen  Post-op Assessment: Report given to RN and Post -op Vital signs reviewed and stable  Post vital signs: Reviewed and stable  Last Vitals:  Vitals Value Taken Time  BP 113/56 03/17/22 1204  Temp    Pulse 61 03/17/22 1205  Resp 14 03/17/22 1205  SpO2 100 % 03/17/22 1205  Vitals shown include unvalidated device data.  Last Pain:  Vitals:   03/17/22 0836  PainSc: 0-No pain      Patients Stated Pain Goal: 3 (73/56/70 1410)  Complications: No notable events documented.

## 2022-03-18 DIAGNOSIS — M1712 Unilateral primary osteoarthritis, left knee: Secondary | ICD-10-CM | POA: Diagnosis not present

## 2022-03-18 NOTE — Progress Notes (Signed)
Physical Therapy Treatment Patient Details Name: Cindy [name] MRN: [id number] DOB: Oct 04, 1945 Today's Date: 03/18/2022   History of Present Illness Pt is a 76yo female presenting s/p L-TKA on 03/17/22. PMH: GERD, HTN, pacemaker, DM, sciatica, Syncope, L-TSA 2020.    PT Comments    Pt is doing well, very motivated. Anticipate readiness for d/c after pm PT session   Recommendations for follow up therapy are one component of a multi-disciplinary discharge planning process, led by the attending physician.  Recommendations may be updated based on patient status, additional functional criteria and insurance authorization.  Follow Up Recommendations  Follow physician's recommendations for discharge plan and follow up therapies     Assistance Recommended at Discharge Frequent or constant Supervision/Assistance  Patient can return home with the following A little help with walking and/or transfers;A little help with bathing/dressing/bathroom;Assistance with cooking/housework;Assist for transportation;Help with stairs or ramp for entrance   Equipment Recommendations  None recommended by PT    Recommendations for Other Services       Precautions / Restrictions Precautions Precautions: Fall;Knee Restrictions Weight Bearing Restrictions: No Other Position/Activity Restrictions: wbat     Mobility  Bed Mobility Overal bed mobility: Needs Assistance Bed Mobility: Supine to Sit     Supine to sit: Supervision     General bed mobility comments: For safety only, no physical assist required, use of bed rails.    Transfers Overall transfer level: Needs assistance Equipment used: Rolling [place] (2 wheels) Transfers: Sit to/from Stand Sit to Stand: Min guard, Supervision           General transfer comment: cues for hand placement    Ambulation/Gait Ambulation/Gait assistance: Min guard, Supervision Gait Distance (Feet): 120 Feet Assistive device: Rolling [place] (2  wheels) Gait Pattern/deviations: Step-to pattern, Decreased stance time - left       General Gait Details: cues for initial sequence, good stability. no LOB   Marine scientist Rankin (Stroke Patients Only)       Balance                                            Cognition Arousal/Alertness: Awake/alert Behavior During Therapy: WFL for tasks assessed/performed Overall Cognitive Status: Within Functional Limits for tasks assessed                                          Exercises Total Joint Exercises Ankle Circles/Pumps: AROM, Both, 20 reps Quad Sets: AROM, Both, 10 reps Heel Slides: AAROM, Left, 10 reps Hip ABduction/ADduction: AROM, Left, 15 reps Straight Leg Raises: AROM, Left, 10 reps    General Comments        Pertinent Vitals/Pain Pain Assessment Pain Assessment: 0-10 Pain Score: 4  Pain Location: left thigh Pain Descriptors / Indicators: Operative site guarding Pain Intervention(s): Limited activity within patient's tolerance, Monitored during session, Premedicated before session, Repositioned    Home Living                          Prior Function            PT Goals (current goals can now be found in the care plan section)  Acute Rehab PT Goals Patient Stated Goal: Walking the dog PT Goal Formulation: With patient Time For Goal Achievement: 03/24/22 Potential to Achieve Goals: Good Progress towards PT goals: Progressing toward goals    Frequency    7X/week      PT Plan Current plan remains appropriate    Co-evaluation              AM-PAC PT "6 Clicks" Mobility   Outcome Measure  Help needed turning from your back to your side while in a flat bed without using bedrails?: None Help needed moving from lying on your back to sitting on the side of a flat bed without using bedrails?: None Help needed moving to and from a bed to a chair (including a  wheelchair)?: A Little Help needed standing up from a chair using your arms (e.g., wheelchair or bedside chair)?: A Little Help needed to walk in hospital room?: A Little Help needed climbing 3-5 steps with a railing? : A Little 6 Click Score: 20    End of Session Equipment Utilized During Treatment: Gait belt Activity Tolerance: Patient tolerated treatment well Patient left: in chair;with call bell/phone within reach;with chair alarm set   PT Visit Diagnosis: Pain;Difficulty in walking, not elsewhere classified (R26.2) Pain - Right/Left: Left Pain - part of body: Knee     Time: 1001-1027 PT Time Calculation (min) (ACUTE ONLY): 26 min  Charges:  $Gait Training: 8-22 mins $Therapeutic Exercise: 8-22 mins                     Baxter Flattery, PT  Acute Rehab Dept [facility]) [phone]  [facility] Weekend Pager (Saturday/Sunday only)  [phone]  03/18/2022    [facility] 03/18/2022, 10:34 AM

## 2022-03-18 NOTE — Discharge Summary (Signed)
Patient ID: Cindy [name] MRN: [id number] DOB/AGE: 1945/12/31 76 y.o.  Admit date: 03/17/2022 Discharge date: 03/18/2022  Admission Diagnoses:  Principal Problem:   Primary osteoarthritis of left knee   Discharge Diagnoses:  Same  Past Medical History:  Diagnosis Date   Abnormal nuclear stress test 12/15/2013   Anemia    hx iron def, no GI loss indentifed - resolved with diet change     Anxiety    Arthritis    Degenerative disc disease, cervical    Depression    Dysrhythmia    GERD (gastroesophageal reflux disease)    GRAVES' DISEASE 2013   I-131 ablation, now post tx hypothyroid state   History of hiatal hernia    HYPERTENSION    Impaired glucose tolerance test    Keloid 12/31/2010   incisional    Pacemaker -Biotronik-CLS 08/20/2010   Fainted easily   Postablative hypothyroidism    Qualifier: Diagnosis of  By: Joya Gaskins CMA, [place]     Pre-diabetes    Sciatica    Syncope 08/20/2010   s/p PPM 08/2010 for sxc pause on loop recorder causing same   Unspecified vitamin D deficiency     Surgeries: Procedure(s): Left TOTAL KNEE ARTHROPLASTY on 03/17/2022   Consultants: None  Discharged Condition: Improved  Hospital Course: Cindy [name] is an 76 y.o. female who was admitted 03/17/2022 for operative treatment ofPrimary osteoarthritis of left knee. Patient has severe unremitting pain that affects sleep, daily activities, and work/hobbies. After pre-op clearance the patient was taken to the operating room on 03/17/2022 and underwent  Procedure(s): Left TOTAL KNEE ARTHROPLASTY.    Patient was given perioperative antibiotics:  Anti-infectives (From admission, onward)    Start     Dose/Rate Route Frequency Ordered Stop   03/17/22 1615  ceFAZolin (ANCEF) IVPB 2g/100 mL premix        2 g 200 mL/hr over 30 Minutes Intravenous Every 6 hours 03/17/22 1456 03/17/22 2358   03/17/22 0800  ceFAZolin (ANCEF) IVPB 2g/100 mL premix        2 g 200 mL/hr over 30 Minutes  Intravenous On call to O.R. 03/17/22 0749 03/17/22 1026        Patient was given sequential compression devices, early ambulation, and chemoprophylaxis to prevent DVT.  Patient benefited maximally from hospital stay and there were no complications.    Recent vital signs: Patient Vitals for the past 24 hrs:  BP Temp Temp src Pulse Resp SpO2 Height Weight  03/18/22 0420 (!) 132/57 97.7 F (36.5 C) Oral 65 16 97 % -- --  03/18/22 0110 (!) 109/47 98.1 F (36.7 C) Oral 64 18 97 % -- --  03/17/22 2137 (!) 126/59 98.2 F (36.8 C) Oral 63 18 97 % -- --  03/17/22 1814 135/73 97.7 F (36.5 C) -- 100 20 98 % -- --  03/17/22 1452 136/63 97.6 F (36.4 C) Oral (!) 58 16 99 % -- --  03/17/22 1430 125/67 -- -- (!) 58 18 99 % -- --  03/17/22 1416 113/69 97.6 F (36.4 C) -- (!) 57 14 97 % -- --  03/17/22 1400 (!) 130/59 -- -- 60 14 97 % -- --  03/17/22 1345 (!) 133/52 -- -- 63 11 97 % -- --  03/17/22 1330 (!) 120/52 (!) 96.3 F (35.7 C) -- 62 15 91 % -- --  03/17/22 1315 (!) 121/59 -- -- (!) 55 14 91 % -- --  03/17/22 1300 (!) 109/47 (!) 96 F (35.6 C) -- (!)  53 17 93 % -- --  03/17/22 1245 (!) 109/48 -- -- (!) 57 13 92 % -- --  03/17/22 1230 99/87 -- -- 60 13 95 % -- --  03/17/22 1215 (!) 112/52 -- -- (!) 57 17 100 % -- --  03/17/22 1204 (!) 113/56 (!) 96.1 F (35.6 C) -- (!) 57 19 99 % -- --  03/17/22 0944 123/63 -- -- (!) 59 17 98 % -- --  03/17/22 0939 -- -- -- (!) 58 12 96 % -- --  03/17/22 0934 -- -- -- (!) 56 15 98 % -- --  03/17/22 0929 109/67 -- -- (!) 59 20 98 % -- --  03/17/22 0924 -- -- -- 61 19 95 % -- --  03/17/22 0919 107/76 -- -- (!) 54 14 99 % -- --  03/17/22 0914 -- -- -- (!) 52 15 (!) 88 % -- --  03/17/22 0909 -- -- -- (!) 58 12 100 % -- --  03/17/22 0904 (!) 153/70 -- -- (!) 56 16 97 % -- --  03/17/22 0844 -- -- -- (!) 56 -- 98 % -- --  03/17/22 0839 (!) 141/70 97.9 F (36.6 C) -- 60 16 98 % -- --  03/17/22 0836 -- -- -- -- -- -- '5\' 2"'$  (1.575 m) 72.1 kg     Recent  laboratory studies: No results for input(s): "WBC", "HGB", "HCT", "PLT", "NA", "K", "CL", "CO2", "BUN", "CREATININE", "GLUCOSE", "INR", "CALCIUM" in the last 72 hours.  Invalid input(s): "PT", "2"   Discharge Medications:   Allergies as of 03/18/2022       Reactions   Ace Inhibitors Swelling   Lisinopril Swelling   angiodema   Codeine Nausea And Vomiting        Medication List     STOP taking these medications    HYDROcodone-acetaminophen 5-325 MG tablet Commonly known as: NORCO/VICODIN   meloxicam 15 MG tablet Commonly known as: MOBIC       TAKE these medications    ALPRAZolam 0.5 MG tablet Commonly known as: XANAX Take 0.25 mg by mouth 2 (two) times daily as needed for anxiety or sleep.   amLODipine 5 MG tablet Commonly known as: NORVASC TAKE 1 TABLET BY MOUTH EVERY DAY What changed: how much to take   aspirin EC 325 MG tablet Take 1 tablet (325 mg total) by mouth 2 (two) times daily after a meal. Take x 1 month post op to decrease risk of blood clots.   buPROPion 300 MG 24 hr tablet Commonly known as: WELLBUTRIN XL Take 300 mg by mouth every morning.   celecoxib 200 MG capsule Commonly known as: CeleBREX Take 1 capsule (200 mg total) by mouth 2 (two) times daily.   docusate sodium 100 MG capsule Commonly known as: Colace Take 1 capsule (100 mg total) by mouth 2 (two) times daily.   EQ MULTIVITAMINS ADULT GUMMY PO Take 2 tablets by mouth daily in the afternoon.   levothyroxine 125 MCG tablet Commonly known as: SYNTHROID Take 125 mcg by mouth daily before breakfast.   nebivolol 5 MG tablet Commonly known as: BYSTOLIC Take 5 mg by mouth daily.   oxyCODONE-acetaminophen 5-325 MG tablet Commonly known as: PERCOCET/ROXICET Take 1-2 tablets by mouth every 6 (six) hours as needed for severe pain.   spironolactone 50 MG tablet Commonly known as: ALDACTONE Take 50 mg by mouth daily.   tiZANidine 2 MG tablet Commonly known as: ZANAFLEX Take 1  tablet (2 mg total) by mouth  every 8 (eight) hours as needed for muscle spasms.   VITAMIN D3 PO Take 1 tablet by mouth daily.               Durable Medical Equipment  (From admission, onward)           Start     Ordered   03/17/22 1457  DME [place] rolling  Once       Question:  Patient needs a Greener to treat with the following condition  Answer:  Primary osteoarthritis of left knee   03/17/22 1456   03/17/22 1457  DME 3 n 1  Once        03/17/22 1456              Discharge Care Instructions  (From admission, onward)           Start     Ordered   03/18/22 0000  Weight bearing as tolerated       Question Answer Comment  Laterality left   Extremity Lower      03/18/22 0814            Diagnostic Studies: DG Chest 2 View  Result Date: 03/12/2022 CLINICAL DATA:  Preoperative chest x-ray for left knee replacement. EXAM: CHEST - 2 VIEW COMPARISON:  November 02, 2019, January 17, 2019 FINDINGS: The heart size and mediastinal contours are stable. Cardiac pacemaker is unchanged. Mild opacity identified in the lateral left lung base. The right lung is clear. The visualized skeletal structures are stable. IMPRESSION: Mild opacity identified in the lateral left lung base. Consider apical lordotic chest x-ray to exclude underlying nodule. Electronically Signed   By: Abelardo Diesel M.D.   On: 03/12/2022 14:18    Disposition: Discharge disposition: 01-Home or Self Care       Discharge Instructions     Call MD / Call 911   Complete by: As directed    If you experience chest pain or shortness of breath, CALL 911 and be transported to the hospital emergency room.  If you develope a fever above 101 F, pus (white drainage) or increased drainage or redness at the wound, or calf pain, call your surgeon's office.   Constipation Prevention   Complete by: As directed    Drink plenty of fluids.  Prune juice may be helpful.  You may use a stool softener, such as Colace (over  the counter) 100 mg twice a day.  Use MiraLax (over the counter) for constipation as needed.   Diet general   Complete by: As directed    Do not put a pillow under the knee. Place it under the heel.   Complete by: As directed    Increase activity slowly as tolerated   Complete by: As directed    Post-operative opioid taper instructions:   Complete by: As directed    POST-OPERATIVE OPIOID TAPER INSTRUCTIONS: It is important to wean off of your opioid medication as soon as possible. If you do not need pain medication after your surgery it is ok to stop day one. Opioids include: Codeine, Hydrocodone(Norco, Vicodin), Oxycodone(Percocet, oxycontin) and hydromorphone amongst others.  Long term and even short term use of opiods can cause: Increased pain response Dependence Constipation Depression Respiratory depression And more.  Withdrawal symptoms can include Flu like symptoms Nausea, vomiting And more Techniques to manage these symptoms Hydrate well Eat regular healthy meals Stay active Use relaxation techniques(deep breathing, meditating, yoga) Do Not substitute Alcohol to help with tapering If you  have been on opioids for less than two weeks and do not have pain than it is ok to stop all together.  Plan to wean off of opioids This plan should start within one week post op of your joint replacement. Maintain the same interval or time between taking each dose and first decrease the dose.  Cut the total daily intake of opioids by one tablet each day Next start to increase the time between doses. The last dose that should be eliminated is the evening dose.      Weight bearing as tolerated   Complete by: As directed    Laterality: left   Extremity: Lower        Follow-up Information     Dorna Leitz, MD. Go on 04/08/2022.   Specialty: Orthopedic Surgery Why: Your appointment is scheduled for 8:45 Contact information: [place] [id number] [phone]          [facility] [place] Follow up.   Specialty: [place] Why: HHPT will provide 6 home visits prior to starting outpatient physical therapy Contact information: [place] [address] [phone]         [facility] Go on 04/01/2022.   Specialty: Physical Therapy Why: Your appointment is scheduled for 12:00. Contact information: [facility] [place] [address] [phone]                  Signed: Erlene Senters 03/18/2022, 8:17 AM

## 2022-03-18 NOTE — Progress Notes (Signed)
Physical Therapy Treatment Patient Details Name: Cindy [name] MRN: [id number] DOB: 11-Feb-1946 Today's Date: 03/18/2022   History of Present Illness Pt is a 76yo female presenting s/p L-TKA on 03/17/22. PMH: GERD, HTN, pacemaker, DM, sciatica, Syncope, L-TSA 2020.    PT Comments    Pt progressing very well. Anticipate steady progress after d/c. Pt has TKA HEP, was also doing prehab so is familiar with exercises.  Pt states her OPPT appoint is 12/26, she is not sure and plans to check date. Educated on importance of knee ROM as well as terminal knee extension. Ready for d/c from PT standpoint with family assist as needed   Recommendations for follow up therapy are one component of a multi-disciplinary discharge planning process, led by the attending physician.  Recommendations may be updated based on patient status, additional functional criteria and insurance authorization.  Follow Up Recommendations  Follow physician's recommendations for discharge plan and follow up therapies     Assistance Recommended at Discharge Frequent or constant Supervision/Assistance  Patient can return home with the following A little help with walking and/or transfers;A little help with bathing/dressing/bathroom;Assistance with cooking/housework;Assist for transportation;Help with stairs or ramp for entrance   Equipment Recommendations  None recommended by PT    Recommendations for Other Services       Precautions / Restrictions Precautions Precautions: Fall;Knee Restrictions Weight Bearing Restrictions: No Other Position/Activity Restrictions: wbat     Mobility  Bed Mobility Overal bed mobility: Needs Assistance Bed Mobility: Supine to Sit     Supine to sit: Supervision     General bed mobility comments: For safety only, no physical assist required, use of bed rails.    Transfers Overall transfer level: Needs assistance Equipment used: Rolling Auman (2 wheels) Transfers: Sit  to/from Stand Sit to Stand: Supervision           General transfer comment: cues for hand placement    Ambulation/Gait Ambulation/Gait assistance: Supervision Gait Distance (Feet): 100 Feet Assistive device: Rolling Ebers (2 wheels) Gait Pattern/deviations: Step-to pattern, Decreased stance time - left       General Gait Details: cues for initial sequence, good stability. no LOB; reviewed gait progression and RW safety   Stairs Stairs: Yes Stairs assistance: Min guard Stair Management: One rail Left, Step to pattern, Forwards Number of Stairs: 2 General stair comments: cues for sequence and technique; good stability, no knee buckling, no LOB   Wheelchair Mobility    Modified Rankin (Stroke Patients Only)       Balance     Sitting balance-Leahy Scale: Good       Standing balance-Leahy Scale: Fair                              Cognition Arousal/Alertness: Awake/alert Behavior During Therapy: WFL for tasks assessed/performed Overall Cognitive Status: Within Functional Limits for tasks assessed                                          Exercises Total Joint Exercises Ankle Circles/Pumps: AROM, Both, 20 reps Quad Sets: AROM, Both, 10 reps Heel Slides: AAROM, Left, 10 reps Hip ABduction/ADduction: AROM, Left, 15 reps Straight Leg Raises: AROM, Left, 10 reps    General Comments        Pertinent Vitals/Pain Pain Assessment Pain Assessment: 0-10 Pain Score: 5  Pain Location: left  thigh Pain Descriptors / Indicators: Operative site guarding Pain Intervention(s): Limited activity within patient's tolerance, Monitored during session, Premedicated before session, Repositioned    Home Living                          Prior Function            PT Goals (current goals can now be found in the care plan section) Acute Rehab PT Goals Patient Stated Goal: Walking the dog PT Goal Formulation: With patient Time For  Goal Achievement: 03/24/22 Potential to Achieve Goals: Good Progress towards PT goals: Progressing toward goals    Frequency    7X/week      PT Plan Current plan remains appropriate    Co-evaluation              AM-PAC PT "6 Clicks" Mobility   Outcome Measure  Help needed turning from your back to your side while in a flat bed without using bedrails?: None Help needed moving from lying on your back to sitting on the side of a flat bed without using bedrails?: None Help needed moving to and from a bed to a chair (including a wheelchair)?: A Little Help needed standing up from a chair using your arms (e.g., wheelchair or bedside chair)?: A Little Help needed to walk in hospital room?: A Little Help needed climbing 3-5 steps with a railing? : A Little 6 Click Score: 20    End of Session Equipment Utilized During Treatment: Gait belt Activity Tolerance: Patient tolerated treatment well Patient left: in chair;with call bell/phone within reach;with chair alarm set   PT Visit Diagnosis: Pain;Difficulty in walking, not elsewhere classified (R26.2) Pain - Right/Left: Left Pain - part of body: Knee     Time: 9480-1655 PT Time Calculation (min) (ACUTE ONLY): 18 min  Charges:  $Gait Training: 8-22 mins                     Baxter Flattery, PT  Acute Rehab Dept [facility]) [phone]  [facility] Weekend Pager (Saturday/Sunday only)  [phone]  03/18/2022    [facility] 03/18/2022, 2:42 PM

## 2022-03-18 NOTE — Progress Notes (Signed)
Subjective: 1 Day Post-Op Procedure(s) (LRB): TOTAL KNEE ARTHROPLASTY (Left) Patient reports pain as mild.  Taking by mouth without difficulty.  Has not voided yet, but just got her Foley out this morning.  Is passing gas.  Objective: Vital signs in last 24 hours: Temp:  [96 F (35.6 C)-98.2 F (36.8 C)] 97.7 F (36.5 C) (12/12 0420) Pulse Rate:  [52-100] 65 (12/12 0420) Resp:  [11-20] 16 (12/12 0420) BP: (99-153)/(47-87) 132/57 (12/12 0420) SpO2:  [88 %-100 %] 97 % (12/12 0420) Weight:  [72.1 kg] 72.1 kg (12/11 0836)  Intake/Output from previous day: 12/11 0701 - 12/12 0700 In: 4173.5 [P.O.:840; I.V.:2878.5; IV Piggyback:455] Out: 2500 [Urine:2450; Blood:50] Intake/Output this shift: No intake/output data recorded.  No results for input(s): "HGB" in the last 72 hours. No results for input(s): "WBC", "RBC", "HCT", "PLT" in the last 72 hours. No results for input(s): "NA", "K", "CL", "CO2", "BUN", "CREATININE", "GLUCOSE", "CALCIUM" in the last 72 hours. No results for input(s): "LABPT", "INR" in the last 72 hours. Left knee exam: Neurovascular intact Sensation intact distally Intact pulses distally Dorsiflexion/Plantar flexion intact Incision: dressing C/D/I Compartment soft   Assessment/Plan: 1 Day Post-Op Procedure(s) (LRB): TOTAL KNEE ARTHROPLASTY (Left) Plan: Up with therapy weightbearing as tolerated on the left. Aspirin 325 mg enteric-coated twice daily x 1 month postop for DVT prophylaxis. Her medications have been sent into her pharmacy. Discharge home today after physical therapy.  Follow-up with Dr. Berenice Primas in 2 weeks.    Patient's anticipated LOS is less than 2 midnights, meeting these requirements: - Younger than 85 - Lives within 1 hour of care - Has a competent adult at home to recover with post-op recover - NO history of  - Chronic pain requiring opiods  - Diabetes  - Coronary Artery Disease  - Heart failure  - Heart attack  - Stroke  -  DVT/VTE  - Cardiac arrhythmia  - Respiratory Failure/COPD  - Renal failure  - Anemia  - Advanced Liver disease     Erlene Senters 03/18/2022, 8:14 AM

## 2022-03-18 NOTE — TOC Transition Note (Signed)
Transition of Care [facility]) - CM/SW Discharge Note  Patient Details  Name: Cindy [name] MRN: [id number] Date of Birth: 1945-07-22  [facility] CM/SW Contact:  Sherie Don, LCSW Phone Number: 03/18/2022, 10:00 AM  Clinical Narrative: Patient is expected to discharge home after working with PT. CSW met with patient to confirm discharge plan. Patient will go home with HHPT, which has been prearranged with [place]. Patient reported she has a rolling Burgener at home, so there are no DME needs at this time. TOC signing off.    Final next level of care: [place] Barriers to Discharge: No Barriers Identified  Patient Goals and CMS Choice Patient states their goals for this hospitalization and ongoing recovery are:: Discharge home with [place] CMS Medicare.gov Compare Post Acute Care list provided to:: Patient Choice offered to / list presented to : Patient  Discharge Plan and Services         DME Arranged: N/A DME Agency: NA HH Arranged: PT HH Agency: [place] Representative spoke with at [place]: Prearranged in orthopedist's office  Social Determinants of Health (SDOH) Interventions    Readmission Risk Interventions     No data to display

## 2022-03-19 ENCOUNTER — Ambulatory Visit (HOSPITAL_COMMUNITY): Payer: PPO

## 2022-03-19 DIAGNOSIS — M8580 Other specified disorders of bone density and structure, unspecified site: Secondary | ICD-10-CM | POA: Diagnosis not present

## 2022-03-19 DIAGNOSIS — I10 Essential (primary) hypertension: Secondary | ICD-10-CM | POA: Diagnosis not present

## 2022-03-19 DIAGNOSIS — D509 Iron deficiency anemia, unspecified: Secondary | ICD-10-CM | POA: Diagnosis not present

## 2022-03-19 DIAGNOSIS — Z791 Long term (current) use of non-steroidal anti-inflammatories (NSAID): Secondary | ICD-10-CM | POA: Diagnosis not present

## 2022-03-19 DIAGNOSIS — Z950 Presence of cardiac pacemaker: Secondary | ICD-10-CM | POA: Diagnosis not present

## 2022-03-19 DIAGNOSIS — M5416 Radiculopathy, lumbar region: Secondary | ICD-10-CM | POA: Diagnosis not present

## 2022-03-19 DIAGNOSIS — I4719 Other supraventricular tachycardia: Secondary | ICD-10-CM | POA: Diagnosis not present

## 2022-03-19 DIAGNOSIS — Z96652 Presence of left artificial knee joint: Secondary | ICD-10-CM | POA: Diagnosis not present

## 2022-03-19 DIAGNOSIS — Z7982 Long term (current) use of aspirin: Secondary | ICD-10-CM | POA: Diagnosis not present

## 2022-03-19 DIAGNOSIS — K219 Gastro-esophageal reflux disease without esophagitis: Secondary | ICD-10-CM | POA: Diagnosis not present

## 2022-03-19 DIAGNOSIS — I493 Ventricular premature depolarization: Secondary | ICD-10-CM | POA: Diagnosis not present

## 2022-03-19 DIAGNOSIS — E0500 Thyrotoxicosis with diffuse goiter without thyrotoxic crisis or storm: Secondary | ICD-10-CM | POA: Diagnosis not present

## 2022-03-19 DIAGNOSIS — M5090 Cervical disc disorder, unspecified, unspecified cervical region: Secondary | ICD-10-CM | POA: Diagnosis not present

## 2022-03-19 DIAGNOSIS — Z96612 Presence of left artificial shoulder joint: Secondary | ICD-10-CM | POA: Diagnosis not present

## 2022-03-19 DIAGNOSIS — E890 Postprocedural hypothyroidism: Secondary | ICD-10-CM | POA: Diagnosis not present

## 2022-03-19 DIAGNOSIS — F32A Depression, unspecified: Secondary | ICD-10-CM | POA: Diagnosis not present

## 2022-03-19 DIAGNOSIS — Z471 Aftercare following joint replacement surgery: Secondary | ICD-10-CM | POA: Diagnosis not present

## 2022-03-19 DIAGNOSIS — Z9181 History of falling: Secondary | ICD-10-CM | POA: Diagnosis not present

## 2022-03-19 DIAGNOSIS — F419 Anxiety disorder, unspecified: Secondary | ICD-10-CM | POA: Diagnosis not present

## 2022-03-19 DIAGNOSIS — Z9842 Cataract extraction status, left eye: Secondary | ICD-10-CM | POA: Diagnosis not present

## 2022-03-19 DIAGNOSIS — M1711 Unilateral primary osteoarthritis, right knee: Secondary | ICD-10-CM | POA: Diagnosis not present

## 2022-03-19 DIAGNOSIS — R7303 Prediabetes: Secondary | ICD-10-CM | POA: Diagnosis not present

## 2022-03-19 DIAGNOSIS — Z87891 Personal history of nicotine dependence: Secondary | ICD-10-CM | POA: Diagnosis not present

## 2022-03-24 ENCOUNTER — Encounter (HOSPITAL_COMMUNITY): Payer: Self-pay | Admitting: Orthopedic Surgery

## 2022-03-24 DIAGNOSIS — Z96652 Presence of left artificial knee joint: Secondary | ICD-10-CM | POA: Diagnosis not present

## 2022-03-24 DIAGNOSIS — Z9181 History of falling: Secondary | ICD-10-CM | POA: Diagnosis not present

## 2022-03-24 DIAGNOSIS — D509 Iron deficiency anemia, unspecified: Secondary | ICD-10-CM | POA: Diagnosis not present

## 2022-03-24 DIAGNOSIS — I10 Essential (primary) hypertension: Secondary | ICD-10-CM | POA: Diagnosis not present

## 2022-03-24 DIAGNOSIS — F32A Depression, unspecified: Secondary | ICD-10-CM | POA: Diagnosis not present

## 2022-03-24 DIAGNOSIS — Z87891 Personal history of nicotine dependence: Secondary | ICD-10-CM | POA: Diagnosis not present

## 2022-03-24 DIAGNOSIS — R7303 Prediabetes: Secondary | ICD-10-CM | POA: Diagnosis not present

## 2022-03-24 DIAGNOSIS — M5416 Radiculopathy, lumbar region: Secondary | ICD-10-CM | POA: Diagnosis not present

## 2022-03-24 DIAGNOSIS — M1711 Unilateral primary osteoarthritis, right knee: Secondary | ICD-10-CM | POA: Diagnosis not present

## 2022-03-24 DIAGNOSIS — Z471 Aftercare following joint replacement surgery: Secondary | ICD-10-CM | POA: Diagnosis not present

## 2022-03-24 DIAGNOSIS — I493 Ventricular premature depolarization: Secondary | ICD-10-CM | POA: Diagnosis not present

## 2022-03-24 DIAGNOSIS — Z950 Presence of cardiac pacemaker: Secondary | ICD-10-CM | POA: Diagnosis not present

## 2022-03-24 DIAGNOSIS — Z9842 Cataract extraction status, left eye: Secondary | ICD-10-CM | POA: Diagnosis not present

## 2022-03-24 DIAGNOSIS — Z96612 Presence of left artificial shoulder joint: Secondary | ICD-10-CM | POA: Diagnosis not present

## 2022-03-24 DIAGNOSIS — F419 Anxiety disorder, unspecified: Secondary | ICD-10-CM | POA: Diagnosis not present

## 2022-03-24 DIAGNOSIS — E0500 Thyrotoxicosis with diffuse goiter without thyrotoxic crisis or storm: Secondary | ICD-10-CM | POA: Diagnosis not present

## 2022-03-24 DIAGNOSIS — E890 Postprocedural hypothyroidism: Secondary | ICD-10-CM | POA: Diagnosis not present

## 2022-03-24 DIAGNOSIS — I4719 Other supraventricular tachycardia: Secondary | ICD-10-CM | POA: Diagnosis not present

## 2022-03-24 DIAGNOSIS — K219 Gastro-esophageal reflux disease without esophagitis: Secondary | ICD-10-CM | POA: Diagnosis not present

## 2022-03-24 DIAGNOSIS — Z7982 Long term (current) use of aspirin: Secondary | ICD-10-CM | POA: Diagnosis not present

## 2022-03-24 DIAGNOSIS — M5090 Cervical disc disorder, unspecified, unspecified cervical region: Secondary | ICD-10-CM | POA: Diagnosis not present

## 2022-03-24 DIAGNOSIS — Z791 Long term (current) use of non-steroidal anti-inflammatories (NSAID): Secondary | ICD-10-CM | POA: Diagnosis not present

## 2022-03-24 DIAGNOSIS — M8580 Other specified disorders of bone density and structure, unspecified site: Secondary | ICD-10-CM | POA: Diagnosis not present

## 2022-03-26 DIAGNOSIS — Z791 Long term (current) use of non-steroidal anti-inflammatories (NSAID): Secondary | ICD-10-CM | POA: Diagnosis not present

## 2022-03-26 DIAGNOSIS — Z96652 Presence of left artificial knee joint: Secondary | ICD-10-CM | POA: Diagnosis not present

## 2022-03-26 DIAGNOSIS — I10 Essential (primary) hypertension: Secondary | ICD-10-CM | POA: Diagnosis not present

## 2022-03-26 DIAGNOSIS — Z471 Aftercare following joint replacement surgery: Secondary | ICD-10-CM | POA: Diagnosis not present

## 2022-03-26 DIAGNOSIS — I493 Ventricular premature depolarization: Secondary | ICD-10-CM | POA: Diagnosis not present

## 2022-03-26 DIAGNOSIS — Z9842 Cataract extraction status, left eye: Secondary | ICD-10-CM | POA: Diagnosis not present

## 2022-03-26 DIAGNOSIS — K219 Gastro-esophageal reflux disease without esophagitis: Secondary | ICD-10-CM | POA: Diagnosis not present

## 2022-03-26 DIAGNOSIS — R7303 Prediabetes: Secondary | ICD-10-CM | POA: Diagnosis not present

## 2022-03-26 DIAGNOSIS — Z96612 Presence of left artificial shoulder joint: Secondary | ICD-10-CM | POA: Diagnosis not present

## 2022-03-26 DIAGNOSIS — F419 Anxiety disorder, unspecified: Secondary | ICD-10-CM | POA: Diagnosis not present

## 2022-03-26 DIAGNOSIS — M5416 Radiculopathy, lumbar region: Secondary | ICD-10-CM | POA: Diagnosis not present

## 2022-03-26 DIAGNOSIS — D509 Iron deficiency anemia, unspecified: Secondary | ICD-10-CM | POA: Diagnosis not present

## 2022-03-26 DIAGNOSIS — I4719 Other supraventricular tachycardia: Secondary | ICD-10-CM | POA: Diagnosis not present

## 2022-03-26 DIAGNOSIS — E0500 Thyrotoxicosis with diffuse goiter without thyrotoxic crisis or storm: Secondary | ICD-10-CM | POA: Diagnosis not present

## 2022-03-26 DIAGNOSIS — F32A Depression, unspecified: Secondary | ICD-10-CM | POA: Diagnosis not present

## 2022-03-26 DIAGNOSIS — M5090 Cervical disc disorder, unspecified, unspecified cervical region: Secondary | ICD-10-CM | POA: Diagnosis not present

## 2022-03-26 DIAGNOSIS — Z9181 History of falling: Secondary | ICD-10-CM | POA: Diagnosis not present

## 2022-03-26 DIAGNOSIS — M8580 Other specified disorders of bone density and structure, unspecified site: Secondary | ICD-10-CM | POA: Diagnosis not present

## 2022-03-26 DIAGNOSIS — E890 Postprocedural hypothyroidism: Secondary | ICD-10-CM | POA: Diagnosis not present

## 2022-03-26 DIAGNOSIS — Z87891 Personal history of nicotine dependence: Secondary | ICD-10-CM | POA: Diagnosis not present

## 2022-03-26 DIAGNOSIS — Z7982 Long term (current) use of aspirin: Secondary | ICD-10-CM | POA: Diagnosis not present

## 2022-03-26 DIAGNOSIS — M1711 Unilateral primary osteoarthritis, right knee: Secondary | ICD-10-CM | POA: Diagnosis not present

## 2022-03-26 DIAGNOSIS — Z950 Presence of cardiac pacemaker: Secondary | ICD-10-CM | POA: Diagnosis not present

## 2022-04-02 ENCOUNTER — Emergency Department (HOSPITAL_COMMUNITY)
Admission: EM | Admit: 2022-04-02 | Discharge: 2022-04-02 | Disposition: A | Discharge status: Home / Self Care | Payer: PPO | Attending: Emergency Medicine | Admitting: Emergency Medicine

## 2022-04-02 ENCOUNTER — Emergency Department (HOSPITAL_COMMUNITY): Payer: PPO

## 2022-04-02 ENCOUNTER — Ambulatory Visit (HOSPITAL_COMMUNITY)
Admission: RE | Admit: 2022-04-02 | Discharge: 2022-04-02 | Disposition: A | Discharge status: Home / Self Care | Payer: PPO | Source: Ambulatory Visit | Attending: Endocrinology | Admitting: Endocrinology

## 2022-04-02 ENCOUNTER — Other Ambulatory Visit: Payer: Self-pay

## 2022-04-02 DIAGNOSIS — M25562 Pain in left knee: Secondary | ICD-10-CM | POA: Diagnosis not present

## 2022-04-02 DIAGNOSIS — Z79899 Other long term (current) drug therapy: Secondary | ICD-10-CM | POA: Insufficient documentation

## 2022-04-02 DIAGNOSIS — Z7982 Long term (current) use of aspirin: Secondary | ICD-10-CM | POA: Insufficient documentation

## 2022-04-02 DIAGNOSIS — I10 Essential (primary) hypertension: Secondary | ICD-10-CM | POA: Insufficient documentation

## 2022-04-02 DIAGNOSIS — M25462 Effusion, left knee: Secondary | ICD-10-CM | POA: Diagnosis not present

## 2022-04-02 DIAGNOSIS — W19XXXA Unspecified fall, initial encounter: Secondary | ICD-10-CM

## 2022-04-02 DIAGNOSIS — M25569 Pain in unspecified knee: Secondary | ICD-10-CM | POA: Diagnosis not present

## 2022-04-02 DIAGNOSIS — Z96652 Presence of left artificial knee joint: Secondary | ICD-10-CM | POA: Diagnosis not present

## 2022-04-02 DIAGNOSIS — E785 Hyperlipidemia, unspecified: Secondary | ICD-10-CM | POA: Insufficient documentation

## 2022-04-02 MED ORDER — MORPHINE SULFATE (PF) 4 MG/ML IV SOLN
4.0000 mg | Freq: Once | INTRAVENOUS | Status: AC
  Administered 2022-04-02: 4 mg via INTRAVENOUS
  Filled 2022-04-02: qty 1

## 2022-04-02 NOTE — Discharge Instructions (Signed)
It was a pleasure taking care of you today.  As discussed, your x-ray did not show any broken bones.  Hardware is still in the correct location.  It did show some swelling of your left knee.  Please call Dr. Mayme Genta office today to schedule an appointment for further evaluation.  Take your pain medication as needed for severe pain.  Pain medication can cause drowsiness so do not drive or operate machinery while on the medication.  Continue to ice and elevate your left leg.  Return to the ER for any worsening symptoms.

## 2022-04-02 NOTE — ED Triage Notes (Signed)
Pt BIB EMS from home after mechanical fall, pt c/o left knee pain. Full knee replacement 16 days ago. Pt denies LOC

## 2022-04-02 NOTE — ED Provider Notes (Signed)
[facility] DEPT Provider Note   CSN: [id number] Arrival date & time: 04/02/22  1411     History  Chief Complaint  Patient presents with   Cindy [name]    Cindy [name] is a 76 y.o. female with a past medical history significant for hypertension, anxiety, GERD, depression, and Graves' disease who presents to the ED due to left knee pain.  Patient had a mechanical fall prior to arrival.  She states she slipped on tile in her bathroom hitting her left knee into the wall and then landing directly on her left knee.  No head injury or loss of consciousness.  She is currently on ASA 325 however, no other blood thinners.  Patient had a knee replacement on 12/11 by Dr. Berenice Primas.  Patient states she has been doing well since the operation and has been doing physical therapy.  Prior to this fall she was able to be weightbearing on left lower extremity without assistance at home.  She endorses severe pain to left knee worse with palpation and movement.  Denies left hip pain.  No low back pain.  No other injuries.  History obtained from patient and past medical records. No interpreter used during encounter.       Home Medications Prior to Admission medications   Medication Sig Start Date End Date Taking? Authorizing Provider  ALPRAZolam Duanne Moron) 0.5 MG tablet Take 0.25 mg by mouth 2 (two) times daily as needed for anxiety or sleep. 05/19/17   [provider]  amLODipine (NORVASC) 5 MG tablet TAKE 1 TABLET BY MOUTH EVERY DAY Patient taking differently: Take 2.5 mg by mouth daily. 01/31/22   Deboraha Sprang, MD  aspirin EC 325 MG tablet Take 1 tablet (325 mg total) by mouth 2 (two) times daily after a meal. Take x 1 month post op to decrease risk of blood clots. 03/17/22   Gary Fleet, PA-C  buPROPion (WELLBUTRIN XL) 300 MG 24 hr tablet Take 300 mg by mouth every morning. 03/18/21   [provider]  celecoxib (CELEBREX) 200 MG capsule Take 1 capsule (200  mg total) by mouth 2 (two) times daily. 03/17/22 03/17/23  Gary Fleet, PA-C  Cholecalciferol (VITAMIN D3 PO) Take 1 tablet by mouth daily.    [provider]  docusate sodium (COLACE) 100 MG capsule Take 1 capsule (100 mg total) by mouth 2 (two) times daily. 03/17/22   Gary Fleet, PA-C  levothyroxine (SYNTHROID) 125 MCG tablet Take 125 mcg by mouth daily before breakfast. 06/09/19   [provider]  Multiple Vitamins-Minerals (EQ MULTIVITAMINS ADULT GUMMY PO) Take 2 tablets by mouth daily in the afternoon.    [provider]  nebivolol (BYSTOLIC) 5 MG tablet Take 5 mg by mouth daily.    [provider]  oxyCODONE-acetaminophen (PERCOCET/ROXICET) 5-325 MG tablet Take 1-2 tablets by mouth every 6 (six) hours as needed for severe pain. 03/17/22   Gary Fleet, PA-C  spironolactone (ALDACTONE) 50 MG tablet Take 50 mg by mouth daily.    [provider]  tiZANidine (ZANAFLEX) 2 MG tablet Take 1 tablet (2 mg total) by mouth every 8 (eight) hours as needed for muscle spasms. 03/17/22   Gary Fleet, PA-C      Allergies    Ace inhibitors, Lisinopril, and Codeine    Review of Systems   Review of Systems  Constitutional:  Negative for chills and fever.  Respiratory:  Negative for shortness of breath.   Cardiovascular:  Negative for chest pain.  Musculoskeletal:  Positive for arthralgias, gait problem and joint swelling. Negative for back pain.  All other systems reviewed and are negative.   Physical Exam Updated Vital Signs BP (!) 150/72 (BP Location: Right Arm)   Pulse 65   Temp 97.6 F (36.4 C) (Oral)   Resp 16   Ht '5\' 2"'$  (1.575 m)   Wt 72 kg   SpO2 100%   BMI 29.03 kg/m  Physical Exam Vitals and nursing note reviewed.  Constitutional:      General: She is not in acute distress.    Appearance: She is not ill-appearing.  HENT:     Head: Normocephalic.  Eyes:     Pupils: Pupils are equal, round, and reactive to light.   Cardiovascular:     Rate and Rhythm: Normal rate and regular rhythm.     Pulses: Normal pulses.     Heart sounds: Normal heart sounds. No murmur heard.    No friction rub. No gallop.  Pulmonary:     Effort: Pulmonary effort is normal.     Breath sounds: Normal breath sounds.  Abdominal:     General: Abdomen is flat. There is no distension.     Palpations: Abdomen is soft.     Tenderness: There is no abdominal tenderness. There is no guarding or rebound.  Musculoskeletal:        General: Normal range of motion.     Cervical back: Neck supple.     Comments: Surgical bandage on anterior aspect of left knee. TTP throughout left knee with decreased ROM. No bony tenderness at left hip. Pedal pulses palpable. Soft compartments.   Skin:    General: Skin is warm and dry.  Neurological:     General: No focal deficit present.     Mental Status: She is alert.  Psychiatric:        Mood and Affect: Mood normal.        Behavior: Behavior normal.     ED Results / Procedures / Treatments   Labs (all labs ordered are listed, but only abnormal results are displayed) Labs Reviewed - No data to display  EKG None  Radiology DG Knee Complete 4 Views Left  Result Date: 04/02/2022 CLINICAL DATA:  Left knee arthroplasty 03/17/2022, fell today EXAM: LEFT KNEE - COMPLETE 4+ VIEW COMPARISON:  None Available. FINDINGS: Frontal, bilateral oblique, and cross-table lateral views of the left knee are obtained. 3 component left knee arthroplasty is identified in the expected position without evidence of acute complication. There are no acute displaced fractures. There is moderate joint effusion and surrounding soft tissue swelling, likely due to recent postoperative state. IMPRESSION: 1. Unremarkable left knee arthroplasty. 2. No acute fracture. 3. Diffuse soft tissue swelling and moderate joint effusion, which could be related to recent postoperative state. Electronically Signed   By: Randa Ngo M.D.    On: 04/02/2022 15:20   CT CARDIAC SCORING (SELF PAY ONLY)  Result Date: 04/02/2022 : Cardiovascular Disease Risk stratification EXAM: Coronary Calcium Score TECHNIQUE: A gated, non-contrast computed tomography scan of the heart was performed using 42m slice thickness. Axial images were analyzed on a dedicated workstation. Calcium scoring of the coronary arteries was performed using the Agatston method. FINDINGS: Coronary arteries: Normal origins. Coronary Calcium Score: Left main: 0 Left anterior descending artery: 38.7 Left circumflex artery: 0.885 Right coronary artery: 0 Total: 39.6 Percentile: 44 Pericardium: Normal. Ascending Aorta: Normal caliber. Non-cardiac: [facility]. Status post pacemaker implantation. IMPRESSION: Coronary calcium  score of 39.6. This was 13 percentile for age-, race-, and sex-matched controls. RECOMMENDATIONS: Coronary artery calcium (CAC) score is a strong predictor of incident coronary heart disease (CHD) and provides predictive information beyond traditional risk factors. CAC scoring is reasonable to use in the decision to withhold, postpone, or initiate statin therapy in intermediate-risk or selected borderline-risk asymptomatic adults (age 60-75 years and LDL-C >=70 to <190 mg/dL) who do not have diabetes or established atherosclerotic cardiovascular disease (ASCVD).* In intermediate-risk (10-year ASCVD risk >=7.5% to <20%) adults or selected borderline-risk (10-year ASCVD risk >=5% to <7.5%) adults in whom a CAC score is measured for the purpose of making a treatment decision the following recommendations have been made: If CAC=0, it is reasonable to withhold statin therapy and reassess in 5 to 10 years, as long as higher risk conditions are absent (diabetes mellitus, family history of premature CHD in first degree relatives (males <55 years; females <65 years), cigarette smoking, or LDL >=190 mg/dL). If CAC is 1 to 99, it is reasonable to  initiate statin therapy for patients >=38 years of age. If CAC is >=100 or >=75th percentile, it is reasonable to initiate statin therapy at any age. Cardiology referral should be considered for patients with CAC scores >=400 or >=75th percentile. *2018 AHA/ACC/AACVPR/AAPA/ABC/ACPM/ADA/AGS/APhA/ASPC/NLA/PCNA Guideline on the Management of Blood Cholesterol: A Report of the American College of Cardiology/American Heart Association Task Force on Clinical Practice Guidelines. J Am Coll Cardiol. 2019;73(24):3168-3209. Berniece Salines, DO The noncardiac portion of this study will be interpreted in separate report by the radiologist. Electronically Signed   By: Berniece Salines D.O.   On: 04/02/2022 15:12    Procedures Procedures    Medications Ordered in ED Medications  morphine (PF) 4 MG/ML injection 4 mg (has no administration in time range)    ED Course/ Medical Decision Making/ A&P                           Medical Decision Making Amount and/or Complexity of Data Reviewed Independent Historian: spouse External Data Reviewed: notes. Radiology: ordered and independent interpretation performed. Decision-making details documented in ED Course.  Risk Prescription drug management.   This patient presents to the ED for concern of left knee pain, this involves an extensive number of treatment options, and is a complaint that carries with it a high risk of complications and morbidity.  The differential diagnosis includes fracture, dislocation, hardware malposition, etc  76 year old female presents to the ED after a mechanical fall landing directly on her left knee.  She had a left knee replacement on 12/11 by Dr. Berenice Primas.  No head injury or loss of consciousness.  On ASA 325 however, no other blood thinners.  Patient has been doing well since her surgery.  Upon arrival, stable vitals.  Patient in no acute distress.  Left lower extremity neurovascularly intact with soft compartments.  Low suspicion for  compartment syndrome.  Surgical bandage on anterior aspect of left knee without evidence of infection around bandage.  Tenderness to anterior aspect the left knee with decreased range of motion.  X-ray ordered to rule out bony fractures.  Patient given pain medication by EMS.  X-ray personally reviewed and interpreted which is negative for any bony fractures.  Does show joint effusion.  Discussed RICE with patient.  Advised patient to call orthopedic office to schedule an appointment for further evaluation.  Patient still has pain medication at home after surgery and advised to take that for  severe pain. Strict ED precautions discussed with patient. Patient states understanding and agrees to plan. Patient discharged home in no acute distress and stable vitals  Discussed with Dr. Tomi Bamberger who agrees with assessment and plan.         Final Clinical Impression(s) / ED Diagnoses Final diagnoses:  Fall, initial encounter  Acute pain of left knee    Rx / DC Orders ED Discharge Orders     None         Karie Kirks 04/02/22 1548    Dorie Rank, MD 04/03/22 1956

## 2022-07-24 ENCOUNTER — Other Ambulatory Visit: Payer: Self-pay | Admitting: Internal Medicine

## 2022-10-13 ENCOUNTER — Encounter: Payer: PPO | Admitting: Physician Assistant

## 2022-10-19 NOTE — Progress Notes (Signed)
  Electrophysiology Office Note:   ID:  Cindy [name], Cindy [name] Apr 06, 1946, MRN [id number]  Primary Cardiologist: None Electrophysiologist: Sherryl Manges, MD      History of Present Illness:   Cindy [name] is a 77 y.o. female with h/o CHB s/p PPM, AT, HTN, and anxiety seen today for routine electrophysiology followup.  Since last being seen in our clinic the patient reports doing very well. Atach rarely occurs.  she denies chest pain, dyspnea, PND, orthopnea, nausea, vomiting, dizziness, syncope, edema, weight gain, or early satiety.   Review of systems complete and found to be negative unless listed in HPI.    EP Information / Studies Reviewed:    EKG is ordered today. Personal review as below.  EKG Interpretation Date/Time:  Monday October 20 2022 09:47:40 EDT Ventricular Rate:  53 PR Interval:  236 QRS Duration:  72 QT Interval:  412 QTC Calculation: 386 R Axis:   57  Text Interpretation: Atrial-paced rhythm with prolonged AV conduction When compared with ECG of 02-Nov-2019 15:40, Electronic atrial pacemaker has replaced Sinus rhythm Vent. rate has decreased BY  28 BPM Questionable change in QRS axis Confirmed by Maxine Glenn [phone]) on 10/20/2022 10:23:55 AM    PPM Interrogation-  reviewed in detail today,  See PACEART report.  Device History: Biotronik Dual Chamber PPM implanted 09/2019 for CHB  Risk Assessment/Calculations:              Physical Exam:   VS:  BP 130/80   Pulse (!) 53   Ht 5\' 2"  (1.575 m)   Wt 152 lb (68.9 kg)   SpO2 98%   BMI 27.80 kg/m    Wt Readings from Last 3 Encounters:  10/20/22 152 lb (68.9 kg)  04/02/22 158 lb 11.7 oz (72 kg)  03/17/22 159 lb (72.1 kg)     GEN: Well nourished, well developed in no acute distress NECK: No JVD; No carotid bruits CARDIAC: Regular rate and rhythm, no murmurs, rubs, gallops RESPIRATORY:  Clear to auscultation without rales, wheezing or rhonchi  ABDOMEN: Soft, non-tender, non-distended EXTREMITIES:   No edema; No deformity   ASSESSMENT AND PLAN:    CHB s/p Biotronik PPM  Normal PPM function See Pace Art report No changes today  HTN Stable on current regimen   Atrial tachycardia Rare with limited symptoms   Disposition:   Follow up with Dr. Graciela Husbands in 12 months  Signed, Graciella Freer, PA-C

## 2022-10-20 ENCOUNTER — Encounter: Payer: Self-pay | Admitting: Student

## 2022-10-20 ENCOUNTER — Ambulatory Visit: Payer: PPO | Attending: Physician Assistant | Admitting: Student

## 2022-10-20 VITALS — BP 130/80 | HR 53 | Ht 62.0 in | Wt 152.0 lb

## 2022-10-20 DIAGNOSIS — Z950 Presence of cardiac pacemaker: Secondary | ICD-10-CM | POA: Diagnosis not present

## 2022-10-20 DIAGNOSIS — I4719 Other supraventricular tachycardia: Secondary | ICD-10-CM

## 2022-10-20 LAB — CUP PACEART INCLINIC DEVICE CHECK
Date Time Interrogation Session: 20240715102344
Implantable Lead Connection Status: 753985
Implantable Lead Connection Status: 753985
Implantable Lead Implant Date: 20120606
Implantable Lead Implant Date: 20120606
Implantable Lead Location: 753859
Implantable Lead Location: 753860
Implantable Lead Model: 5076
Implantable Lead Model: 5076
Implantable Pulse Generator Implant Date: 20120606
Pulse Gen Serial Number: 66089338

## 2022-10-20 NOTE — Patient Instructions (Signed)
Medication Instructions:  Your physician recommends that you continue on your current medications as directed. Please refer to the Current Medication list given to you today.  *If you need a refill on your cardiac medications before your next appointment, please call your pharmacy*  Lab Work: None ordered If you have labs (blood work) drawn today and your tests are completely normal, you will receive your results only by: MyChart Message (if you have MyChart) OR A paper copy in the mail If you have any lab test that is abnormal or we need to change your treatment, we will call you to review the results.  Follow-Up: At [facility], you and your health needs are our priority.  As part of our continuing mission to provide you with exceptional heart care, we have created designated Provider Care Teams.  These Care Teams include your primary Cardiologist (physician) and Advanced Practice Providers (APPs -  Physician Assistants and Nurse Practitioners) who all work together to provide you with the care you need, when you need it.  Your next appointment:   1 year(s)  Provider:   Steven Klein, MD  

## 2022-11-04 ENCOUNTER — Other Ambulatory Visit: Payer: Self-pay | Admitting: Orthopedic Surgery

## 2022-11-04 DIAGNOSIS — M19011 Primary osteoarthritis, right shoulder: Secondary | ICD-10-CM

## 2022-11-07 ENCOUNTER — Encounter: Payer: Self-pay | Admitting: Internal Medicine

## 2022-11-18 ENCOUNTER — Telehealth: Payer: Self-pay

## 2022-11-18 NOTE — Telephone Encounter (Signed)
Received request for MRI through [facility].   Faxed back explaining that [facility] cannot perform due to device being a Biotronik with MDT leads.   However, appears in prior patient message on 11/07/22 that Cindy [name] was working on assisting patient with getting scheduled outside of [facility] system.  I did not see this prior to faxing back.   Will forward faxed paper copy to Cindy Key RN for next steps.  Apologies for the confusion.

## 2022-11-26 NOTE — Telephone Encounter (Signed)
Received form. Giving to [facility] 11/27/22.

## 2022-11-26 NOTE — Telephone Encounter (Signed)
Had to request new clearance/procedure form form Dr. Veda Canning office.  Waiting for new fax order to come through and will forward to [place], [place] for completion. Sorry for confusion.

## 2022-11-27 NOTE — Telephone Encounter (Signed)
Email sent to [facility] coordinator, Rosemary Holms, with patients information and request for MRI of pt's right shoulder d/t shoulder pain.  Patient has also been notified via MyChart that this request has been initiated on her behalf.

## 2022-11-27 NOTE — Telephone Encounter (Signed)
Voicemail message to [place] with [facility] advising request has been sent to [facility] for assistance with coordinating pt's MRI of right shoulder.  Samantha may contact RN at [phone].

## 2023-03-02 ENCOUNTER — Encounter: Payer: Self-pay | Admitting: Internal Medicine

## 2023-03-19 ENCOUNTER — Telehealth: Payer: Self-pay | Admitting: Internal Medicine

## 2023-03-19 NOTE — Telephone Encounter (Signed)
   Pre-operative Risk Assessment    Patient Name: Cindy [name]  DOB: 1945/09/14 MRN: [id number]      Request for Surgical Clearance    Procedure:   right shoulder replacement  Date of Surgery:  Clearance TBD                                 Surgeon:  Dr. Ave Filter Surgeon's Group or Practice Name:  [facility]  Phone number:  [phone] Fax number:  [phone]   Type of Clearance Requested:   - Medical  - Pharmacy:  Hold        Type of Anesthesia:   Choice    Additional requests/questions:      SignedFilomena Jungling   03/19/2023, 4:40 PM

## 2023-03-20 ENCOUNTER — Telehealth: Payer: Self-pay | Admitting: *Deleted

## 2023-03-20 NOTE — Telephone Encounter (Signed)
Pt is set up for tele phone clearance. Med rec and consent done. Will notify requesting surgeons office and remove from pre op pool.     Patient Consent for Virtual Visit        Cindy [name] has provided verbal consent on 03/20/2023 for a virtual visit (video or telephone).   CONSENT FOR VIRTUAL VISIT FOR:  Cindy [name]  By participating in this virtual visit I agree to the following:  I hereby voluntarily request, consent and authorize [facility] and its employed or contracted physicians, physician assistants, nurse practitioners or other licensed health care professionals (the Practitioner), to provide me with telemedicine health care services (the "Services") as deemed necessary by the treating Practitioner. I acknowledge and consent to receive the Services by the Practitioner via telemedicine. I understand that the telemedicine visit will involve communicating with the Practitioner through live audiovisual communication technology and the disclosure of certain medical information by electronic transmission. I acknowledge that I have been given the opportunity to request an in-person assessment or other available alternative prior to the telemedicine visit and am voluntarily participating in the telemedicine visit.  I understand that I have the right to withhold or withdraw my consent to the use of telemedicine in the course of my care at any time, without affecting my right to future care or treatment, and that the Practitioner or I may terminate the telemedicine visit at any time. I understand that I have the right to inspect all information obtained and/or recorded in the course of the telemedicine visit and may receive copies of available information for a reasonable fee.  I understand that some of the potential risks of receiving the Services via telemedicine include:  Delay or interruption in medical evaluation due to technological equipment failure or  disruption; Information transmitted may not be sufficient (e.g. poor resolution of images) to allow for appropriate medical decision making by the Practitioner; and/or  In rare instances, security protocols could fail, causing a breach of personal health information.  Furthermore, I acknowledge that it is my responsibility to provide information about my medical history, conditions and care that is complete and accurate to the best of my ability. I acknowledge that Practitioner's advice, recommendations, and/or decision may be based on factors not within their control, such as incomplete or inaccurate data provided by me or distortions of diagnostic images or specimens that may result from electronic transmissions. I understand that the practice of medicine is not an exact science and that Practitioner makes no warranties or guarantees regarding treatment outcomes. I acknowledge that a copy of this consent can be made available to me via my patient portal [facility] MyChart), or I can request a printed copy by calling the office of [facility].    I understand that my insurance will be billed for this visit.   I have read or had this consent read to me. I understand the contents of this consent, which adequately explains the benefits and risks of the Services being provided via telemedicine.  I have been provided ample opportunity to ask questions regarding this consent and the Services and have had my questions answered to my satisfaction. I give my informed consent for the services to be provided through the use of telemedicine in my medical care

## 2023-03-20 NOTE — Telephone Encounter (Signed)
Lvm for pt to call back and ask for pre op team.

## 2023-03-20 NOTE — Telephone Encounter (Signed)
Pt is returning call.  

## 2023-03-20 NOTE — Telephone Encounter (Signed)
   Name: Cindy [name]  DOB: 11/09/1945  MRN: [id number]  Primary Cardiologist: Dr. Graciela Husbands    Preoperative team, please contact this patient and set up a phone call appointment for further preoperative risk assessment. Please obtain consent and complete medication review. Thank you for your help. Last seen on 10/20/2022. NO follow up appointments are listed.   I confirm that guidance regarding antiplatelet and oral anticoagulation therapy has been completed and, if necessary, noted below.  Per office protocol, if patient is without any new symptoms or concerns at the time of their virtual visit, he/she may hold ASA for 7 days prior to procedure. Please resume ASA as soon as possible postprocedure, at the discretion of the surgeon.    I also confirmed the patient resides in the state of [place]. As per [facility] Medical Board telemedicine laws, the patient must reside in the state in which the provider is licensed.   Joni Reining, NP 03/20/2023, 7:44 AM [facility]

## 2023-03-26 ENCOUNTER — Other Ambulatory Visit: Payer: Self-pay | Admitting: Internal Medicine

## 2023-03-26 ENCOUNTER — Telehealth: Payer: Self-pay | Admitting: Internal Medicine

## 2023-03-26 DIAGNOSIS — M8580 Other specified disorders of bone density and structure, unspecified site: Secondary | ICD-10-CM

## 2023-03-26 NOTE — Telephone Encounter (Signed)
   Pre-operative Risk Assessment    Patient Name: Cindy [name]  DOB: 10/02/1945 MRN: [id number]     Request for Surgical Clearance    Procedure:  Right reverse total shoulder arthroplasty   Date of Surgery:  Clearance TBD                                 Surgeon:  Dr. Jones Broom  Surgeon's Group or Practice Name:  [facility]  Phone number:  [phone] Fax number:  [phone]   Type of Clearance Requested:   - Medical    Type of Anesthesia:  Choice    Additional requests/questions:    SignedVernard Gambles   03/26/2023, 3:08 PM

## 2023-03-26 NOTE — Telephone Encounter (Signed)
   Name: Cindy [name]  DOB: 1945-05-21  MRN: [id number]  Primary Cardiologist: None   Preoperative team, please contact this patient and set up a phone call appointment for further preoperative risk assessment. Please obtain consent and complete medication review. Thank you for your help.  I confirm that guidance regarding antiplatelet and oral anticoagulation therapy has been completed and, if necessary, noted below.  None  I also confirmed the patient resides in the state of [place]. As per [facility] [place] Medical Board telemedicine laws, the patient must reside in the state in which the provider is licensed.   Napoleon Form, Leodis Rains, NP 03/26/2023, 3:29 PM [facility]

## 2023-03-26 NOTE — Telephone Encounter (Signed)
Patient already has a scheduled appointment for pre-op clearance.

## 2023-03-26 NOTE — Telephone Encounter (Signed)
Patient dropped off Pre-op assessment papers  03-26-2023 [employer].

## 2023-03-31 ENCOUNTER — Ambulatory Visit: Payer: PPO | Attending: Cardiovascular Disease | Admitting: General Practice

## 2023-03-31 DIAGNOSIS — Z01810 Encounter for preprocedural cardiovascular examination: Secondary | ICD-10-CM | POA: Diagnosis not present

## 2023-03-31 NOTE — Progress Notes (Signed)
Virtual Visit via Telephone Note   Because of Cindy [name] co-morbid illnesses, she is at least at moderate risk for complications without adequate follow up.  This format is felt to be most appropriate for this patient at this time.  The patient did not have access to video technology/had technical difficulties with video requiring transitioning to audio format only (telephone).  All issues noted in this document were discussed and addressed.  No physical exam could be performed with this format.  Please refer to the patient's chart for her consent to telehealth for [facility].  Evaluation Performed:  Preoperative cardiovascular risk assessment _____________   Date:  03/31/2023   Patient ID:  Cindy [name], DOB November 16, 1945, MRN [id number] Patient Location:  Home Provider location:   Office  Primary Care Provider:  Melida Quitter, MD Primary Cardiologist:  None  Chief Complaint / Patient Profile   77 y.o. y/o female with a h/o HTN, PVC, Atrial tachycardia who is pending Right reverse total shoulder arthroplasty  and presents today for telephonic preoperative cardiovascular risk assessment.  History of Present Illness    Cindy [name] is a 77 y.o. female who presents via audio/video conferencing for a telehealth visit today.  Pt was last seen in cardiology clinic on  CHB s/p PPM, AT, HTN  by Lanna Poche, NP.  At that time Cindy [name] was doing well .  The patient is now pending procedure as outlined above. Since her last visit, she  remains stable from a cardiac standpoint.   Today she denies chest pain, shortness of breath, lower extremity edema, fatigue, palpitations, melena, hematuria, hemoptysis, diaphoresis, weakness, presyncope, syncope, orthopnea, and PND.   Past Medical History    Past Medical History:  Diagnosis Date   Abnormal nuclear stress test 12/15/2013   Anemia    hx iron def, no GI loss indentifed - resolved with diet change      Anxiety    Arthritis    Degenerative disc disease, cervical    Depression    Dysrhythmia    GERD (gastroesophageal reflux disease)    GRAVES' DISEASE 2013   I-131 ablation, now post tx hypothyroid state   History of hiatal hernia    HYPERTENSION    Impaired glucose tolerance test    Keloid 12/31/2010   incisional    Pacemaker -Biotronik-CLS 08/20/2010   Fainted easily   Postablative hypothyroidism    Qualifier: Diagnosis of  By: Delford Field CMA, [place]     Pre-diabetes    Sciatica    Syncope 08/20/2010   s/p PPM 08/2010 for sxc pause on loop recorder causing same   Unspecified vitamin D deficiency    Past Surgical History:  Procedure Laterality Date   APPENDECTOMY     blephroplasty     BREAST BIOPSY Left 2001   guided excisional bx (L) breast microcalcification at 12 o'clock- Benign   BUNIONECTOMY Left    CATARACT EXTRACTION  2014   FRACTURE SURGERY     ORIF 06-2010   HIATAL HERNIA REPAIR N/A 04/10/2020   Procedure: LAPAROSCOPIC REPAIR OF TYPE 3 HIATAL HERNIA WITH NISSEN FUNDOPLICATION;  Surgeon: Luretha Murphy, MD;  Location: [facility];  Service: General;  Laterality: N/A;   KNEE ARTHROSCOPY Left 10/28/2021   Procedure: ARTHROSCOPY KNEE;  Surgeon: Jodi Geralds, MD;  Location: [facility];  Service: Orthopedics;  Laterality: Left;   LEFT HEART CATHETERIZATION WITH CORONARY ANGIOGRAM N/A 12/22/2013   Procedure: LEFT HEART CATHETERIZATION WITH CORONARY ANGIOGRAM;  Surgeon: Sherilyn Cooter  Jamey Reas, MD;  Location: [facility] CATH LAB;  Service: Cardiovascular;  Laterality: N/A;   PACEMAKER INSERTION     Right ankle  06/2010   Trimalleolar fracture   TOTAL KNEE ARTHROPLASTY Left 03/17/2022   Procedure: TOTAL KNEE ARTHROPLASTY;  Surgeon: Jodi Geralds, MD;  Location: [facility];  Service: Orthopedics;  Laterality: Left;   TOTAL SHOULDER ARTHROPLASTY Left 01/27/2019   Procedure: TOTAL SHOULDER ARTHROPLASTY;  Surgeon: Jones Broom, MD;  Location: [facility];  Service: Orthopedics;  Laterality: Left;     Allergies  Allergies  Allergen Reactions   Ace Inhibitors Swelling   Lisinopril Swelling    angiodema   Codeine Nausea And Vomiting    Home Medications    Prior to Admission medications   Medication Sig Start Date End Date Taking? Authorizing Provider  ALPRAZolam Prudy Feeler) 0.5 MG tablet Take 0.25 mg by mouth 2 (two) times daily as needed for anxiety or sleep. 05/19/17   [provider]  amLODipine (NORVASC) 5 MG tablet TAKE 1 TABLET BY MOUTH EVERY DAY 07/24/22   Duke Salvia, MD  buPROPion (WELLBUTRIN XL) 300 MG 24 hr tablet Take 150 mg by mouth every morning. 03/18/21   [provider]  levothyroxine (SYNTHROID) 125 MCG tablet Take 125 mcg by mouth daily before breakfast. 06/09/19   [provider]  Multiple Vitamins-Minerals (MULTI-VITAMIN GUMMIES PO) Take 2 tablets by mouth daily.    [provider]  nebivolol (BYSTOLIC) 5 MG tablet Take 5 mg by mouth daily.    [provider]  PARoxetine (PAXIL) 10 MG tablet Take 10 mg by mouth daily.    [provider]  pregabalin (LYRICA) 75 MG capsule Take 75 mg by mouth daily.    [provider]  spironolactone (ALDACTONE) 50 MG tablet Take 50 mg by mouth daily.    [provider]  tiZANidine (ZANAFLEX) 2 MG tablet Take 1 tablet (2 mg total) by mouth every 8 (eight) hours as needed for muscle spasms. 03/17/22   Marshia Ly, PA-C    Physical Exam    Vital Signs:  Cindy [name] does not have vital signs available for review today.  Given telephonic nature of communication, physical exam is limited. AAOx3. NAD. Normal affect.  Speech and respirations are unlabored.  Accessory Clinical Findings    None  Assessment & Plan    1.  Preoperative Cardiovascular Risk Assessment: Right reverse total shoulder arthroplasty, Surgeon:  Dr. Jones Broom  Surgeon's Group or Practice Name:  [facility]  Fax number:  [phone]      Primary  Cardiologist: Dr. Graciela Husbands  Chart reviewed as part of pre-operative protocol coverage. Given past medical history and time since last visit, based on ACC/AHA guidelines, Cindy [name] would be at acceptable risk for the planned procedure without further cardiovascular testing.   Her RCRI is low risk, 0.9% risk of major cardiac event. She is able to complete greater than 4 METS of physical activity.   Patient was advised that if she develops new symptoms prior to surgery to contact our office to arrange a follow-up appointment.  He verbalized understanding.  I will route this recommendation to the requesting party via Epic fax function and remove from pre-op pool.       Time:   Today, I have spent 5 minutes with the patient with telehealth technology discussing medical history, symptoms, and management plan.     Ronney Asters, NP  03/31/2023, 6:57 AM    Prior to patient's  phone evaluation I spent greater than 10 minutes reviewing their past medical history and cardiac medications.

## 2023-04-08 HISTORY — PX: OSTEOTOMY: SHX137

## 2023-04-20 ENCOUNTER — Encounter: Payer: Self-pay | Admitting: Internal Medicine

## 2023-04-20 ENCOUNTER — Other Ambulatory Visit: Payer: Self-pay | Admitting: Orthopedic Surgery

## 2023-04-20 NOTE — Patient Instructions (Addendum)
 SURGICAL WAITING ROOM VISITATION  Patients having surgery or a procedure may have no more than 2 support people in the waiting area - these visitors may rotate.    Children under the age of 71 must have an adult with them who is not the patient.  Due to an increase in RSV and influenza rates and associated hospitalizations, children ages 71 and under may not visit patients in [facility] hospitals.  If the patient needs to stay at the hospital during part of their recovery, the visitor guidelines for inpatient rooms apply. Pre-op nurse will coordinate an appropriate time for 1 support person to accompany patient in pre-op.  This support person may not rotate.    Please refer to the [facility] website for the visitor guidelines for Inpatients (after your surgery is over and you are in a regular room).    Your procedure is scheduled on: 04/30/23   Report to [facility] Main Entrance    Report to admitting at 5:15 AM   Call this number if you have problems the morning of surgery [phone]   Do not eat food :After Midnight.   After Midnight you may have the following liquids until 4:30 AM DAY OF SURGERY  Water  Non-Citrus Juices (without pulp, NO RED-Apple, White grape, White cranberry) Black Coffee (NO MILK/CREAM OR CREAMERS, sugar ok)  Clear Tea (NO MILK/CREAM OR CREAMERS, sugar ok) regular and decaf                             Plain Jell-O (NO RED)                                           Fruit ices (not with fruit pulp, NO RED)                                     Popsicles (NO RED)                                                               Sports drinks like Gatorade (NO RED)          If you have questions, please contact your surgeon's office.   FOLLOW BOWEL PREP AND ANY ADDITIONAL PRE OP INSTRUCTIONS YOU RECEIVED FROM YOUR SURGEON'S OFFICE!!!     Oral Hygiene is also important to reduce your risk of infection.                                    Remember -  BRUSH YOUR TEETH THE MORNING OF SURGERY WITH YOUR REGULAR TOOTHPASTE  DENTURES WILL BE REMOVED PRIOR TO SURGERY PLEASE DO NOT APPLY Poly grip OR ADHESIVES!!!   Stop all vitamins and herbal supplements 7 days before surgery.   Take these medicines the morning of surgery with A SIP OF WATER : Xanax , Amlodipine , Bupropion , Synthroid , Pail, Lyrica, Nebivolol   You may not have any metal on your body including hair pins, jewelry, and body piercing             Do not wear make-up, lotions, powders, perfumes, or deodorant  Do not wear nail polish including gel and S&S, artificial/acrylic nails, or any other type of covering on natural nails including finger and toenails. If you have artificial nails, gel coating, etc. that needs to be removed by a nail salon please have this removed prior to surgery or surgery may need to be canceled/ delayed if the surgeon/ anesthesia feels like they are unable to be safely monitored.   Do not shave  48 hours prior to surgery.    Do not bring valuables to the hospital. [facility] IS NOT             RESPONSIBLE   FOR VALUABLES.   Contacts, glasses, dentures or bridgework may not be worn into surgery.  DO NOT BRING YOUR HOME MEDICATIONS TO THE HOSPITAL. PHARMACY WILL DISPENSE MEDICATIONS LISTED ON YOUR MEDICATION LIST TO YOU DURING YOUR ADMISSION IN THE HOSPITAL!    Patients discharged on the day of surgery will not be allowed to drive home.  Someone NEEDS to stay with you for the first 24 hours after anesthesia.              Please read over the following fact sheets you were given: IF YOU HAVE QUESTIONS ABOUT YOUR PRE-OP INSTRUCTIONS PLEASE CALL [phone] [place]    If you received a COVID test during your pre-op visit  it is requested that you wear a mask when out in public, stay away from anyone that may not be feeling well and notify your surgeon if you develop symptoms. If you test positive for Covid or have been in contact  with anyone that has tested positive in the last 10 days please notify you surgeon.      Pre-operative 5 CHG Bath Instructions   You can play a key role in reducing the risk of infection after surgery. Your skin needs to be as free of germs as possible. You can reduce the number of germs on your skin by washing with CHG (chlorhexidine  gluconate) soap before surgery. CHG is an antiseptic soap that kills germs and continues to kill germs even after washing.   DO NOT use if you have an allergy to chlorhexidine /CHG or antibacterial soaps. If your skin becomes reddened or irritated, stop using the CHG and notify one of our RNs at [phone].   Please shower with the CHG soap starting 4 days before surgery using the following schedule:     Please keep in mind the following:  DO NOT shave, including legs and underarms, starting the day of your first shower.   You may shave your face at any point before/day of surgery.  Place clean sheets on your bed the day you start using CHG soap. Use a clean washcloth (not used since being washed) for each shower. DO NOT sleep with pets once you start using the CHG.   CHG Shower Instructions:  If you choose to wash your hair and private area, wash first with your normal shampoo/soap.  After you use shampoo/soap, rinse your hair and body thoroughly to remove shampoo/soap residue.  Turn the water  OFF and apply about 3 tablespoons (45 ml) of CHG soap to a CLEAN washcloth.  Apply CHG soap ONLY FROM YOUR NECK DOWN TO YOUR TOES (washing for 3-5 minutes)  DO NOT use  CHG soap on face, private areas, open wounds, or sores.  Pay special attention to the area where your surgery is being performed.  If you are having back surgery, having someone wash your back for you may be helpful. Wait 2 minutes after CHG soap is applied, then you may rinse off the CHG soap.  Pat dry with a clean towel  Put on clean clothes/pajamas   If you choose to wear lotion, please use ONLY  the CHG-compatible lotions on the back of this paper.     Additional instructions for the day of surgery: DO NOT APPLY any lotions, deodorants, cologne, or perfumes.   Put on clean/comfortable clothes.  Brush your teeth.  Ask your nurse before applying any prescription medications to the skin.      CHG Compatible Lotions   Aveeno Moisturizing lotion  Cetaphil Moisturizing Cream  Cetaphil Moisturizing Lotion  Clairol Herbal Essence Moisturizing Lotion, Dry Skin  Clairol Herbal Essence Moisturizing Lotion, Extra Dry Skin  Clairol Herbal Essence Moisturizing Lotion, Normal Skin  Curel Age Defying Therapeutic Moisturizing Lotion with Alpha Hydroxy  Curel Extreme Care Body Lotion  Curel Soothing Hands Moisturizing Hand Lotion  Curel Therapeutic Moisturizing Cream, Fragrance-Free  Curel Therapeutic Moisturizing Lotion, Fragrance-Free  Curel Therapeutic Moisturizing Lotion, Original Formula  Eucerin Daily Replenishing Lotion  Eucerin Dry Skin Therapy Plus Alpha Hydroxy Crme  Eucerin Dry Skin Therapy Plus Alpha Hydroxy Lotion  Eucerin Original Crme  Eucerin Original Lotion  Eucerin Plus Crme Eucerin Plus Lotion  Eucerin TriLipid Replenishing Lotion  Keri Anti-Bacterial Hand Lotion  Keri Deep Conditioning Original Lotion Dry Skin Formula Softly Scented  Keri Deep Conditioning Original Lotion, Fragrance Free Sensitive Skin Formula  Keri Lotion Fast Absorbing Fragrance Free Sensitive Skin Formula  Keri Lotion Fast Absorbing Softly Scented Dry Skin Formula  Keri Original Lotion  Keri Skin Renewal Lotion Keri Silky Smooth Lotion  Keri Silky Smooth Sensitive Skin Lotion  Nivea Body Creamy Conditioning Oil  Nivea Body Extra Enriched Teacher, Adult Education Moisturizing Lotion Nivea Crme  Nivea Skin Firming Lotion  NutraDerm 30 Skin Lotion  NutraDerm Skin Lotion  NutraDerm Therapeutic Skin Cream  NutraDerm Therapeutic Skin Lotion  ProShield  Protective Hand Cream  Provon moisturizing lotion

## 2023-04-20 NOTE — Progress Notes (Signed)
 Please place orders for PAT appointment scheduled 04/21/23.

## 2023-04-20 NOTE — Progress Notes (Signed)
 PERIOPERATIVE PRESCRIPTION FOR IMPLANTED CARDIAC DEVICE PROGRAMMING  Patient Information: Name:  Cindy [name]  DOB:  [birth year]  MRN:  [id number]    Planned Procedure:  right reverse shoulder arthroplasty  Surgeon:  Dr. Dozier  Date of Procedure:  04/30/23  Cautery will be used.  Position during surgery:  unknown   Please send documentation back to:  [name] (Fax # [phone])  Device Information:  Clinic EP Physician:  Elspeth Sage, MD   Device Type:  Pacemaker Manufacturer and Phone #:  Biotronik: [phone] Pacemaker Dependent?:  No. Date of Last Device Check:  10/20/2022 Normal Device Function?:  Yes.    Electrophysiologist's Recommendations:  Have magnet available. Provide continuous ECG monitoring when magnet is used or reprogramming is to be performed.  Procedure will likely interfere with device function.  Device should be programmed:  Asynchronous pacing during procedure and returned to normal programming after procedure  Per Device Clinic Standing Orders, [name], RN  1:13 PM 04/20/2023

## 2023-04-20 NOTE — Progress Notes (Addendum)
 COVID Vaccine Completed: yes  Date of COVID positive in last 90 days: no  PCP - Leita Blind, MD Cardiologist -  Electrophysiologist- Elspeth Sage, MD  Cardiac clearance by Josefa Beauvais, NP 03/31/23 in Epic   Chest x-ray - n/a EKG - 10/20/22 Epic Stress Test - 11/15/13 Epic ECHO - n/a Cardiac Cath - 2015 Pacemaker/ICD device last checked: 12/17/22- called rep to inform ([employer]) [place] PH# [phone] Spinal Cord Stimulator: n/a  Bowel Prep - no  Sleep Study - n/a CPAP -   Fasting Blood Sugar - n/a Checks Blood Sugar _____ times a day  Last dose of GLP1 agonist-  N/A GLP1 instructions:  Hold 7 days before surgery    Last dose of SGLT-2 inhibitors-  N/A SGLT-2 instructions:  Hold 3 days before surgery    Blood Thinner Instructions: n/a Aspirin  Instructions: Last Dose:  Activity level: Can go up a flight of stairs and perform activities of daily living without stopping and without symptoms of chest pain or shortness of breath.   Anesthesia review: pacemaker, anemia, HTN,  Patient denies shortness of breath, fever, cough and chest pain at PAT appointment  Patient verbalized understanding of instructions that were given to them at the PAT appointment. Patient was also instructed that they will need to review over the PAT instructions again at home before surgery.

## 2023-04-21 ENCOUNTER — Encounter (HOSPITAL_COMMUNITY)
Admission: RE | Admit: 2023-04-21 | Discharge: 2023-04-21 | Disposition: A | Discharge status: Home / Self Care | Payer: PPO | Source: Ambulatory Visit | Attending: Orthopedic Surgery | Admitting: Orthopedic Surgery

## 2023-04-21 ENCOUNTER — Other Ambulatory Visit: Payer: Self-pay

## 2023-04-21 ENCOUNTER — Encounter (HOSPITAL_COMMUNITY): Payer: Self-pay

## 2023-04-21 VITALS — BP 135/73 | HR 64 | Temp 98.5°F | Resp 14 | Ht 62.0 in | Wt 156.0 lb

## 2023-04-21 DIAGNOSIS — I10 Essential (primary) hypertension: Secondary | ICD-10-CM | POA: Insufficient documentation

## 2023-04-21 DIAGNOSIS — Z01812 Encounter for preprocedural laboratory examination: Secondary | ICD-10-CM | POA: Diagnosis not present

## 2023-04-21 DIAGNOSIS — Z01818 Encounter for other preprocedural examination: Secondary | ICD-10-CM

## 2023-04-21 LAB — BASIC METABOLIC PANEL
Anion gap: 10 (ref 5–15)
BUN: 21 mg/dL (ref 8–23)
CO2: 24 mmol/L (ref 22–32)
Calcium: 9.7 mg/dL (ref 8.9–10.3)
Chloride: 106 mmol/L (ref 98–111)
Creatinine, Ser: 1.17 mg/dL — ABNORMAL HIGH (ref 0.44–1.00)
GFR, Estimated: 48 mL/min — ABNORMAL LOW (ref >60)
Glucose, Bld: 96 mg/dL (ref 70–99)
Potassium: 4.6 mmol/L (ref 3.5–5.1)
Sodium: 140 mmol/L (ref 135–145)

## 2023-04-21 LAB — CBC
HCT: 45 % (ref 36.0–46.0)
Hemoglobin: 14.6 g/dL (ref 12.0–15.0)
MCH: 30.2 pg (ref 26.0–34.0)
MCHC: 32.4 g/dL (ref 30.0–36.0)
MCV: 93.2 fL (ref 80.0–100.0)
Platelets: 274 10*3/uL (ref 150–400)
RBC: 4.83 MIL/uL (ref 3.87–5.11)
RDW: 12.1 % (ref 11.5–15.5)
WBC: 5.8 10*3/uL (ref 4.0–10.5)
nRBC: 0 % (ref 0.0–0.2)

## 2023-04-21 LAB — SURGICAL PCR SCREEN
MRSA, PCR: NEGATIVE
Staphylococcus aureus: NEGATIVE

## 2023-04-22 NOTE — Progress Notes (Signed)
 Anesthesia Chart Review   Case: [id number] Date/Time: 04/30/23 0715   Procedure: REVERSE SHOULDER ARTHROPLASTY (Right: Shoulder)   Anesthesia type: Choice   Pre-op diagnosis: RIGHT SHOULDER ARTHRITIS WITH ROTATOR CUFF TEAR   Location: [facility]OM 07 / [facility]   Surgeons: Dozier Soulier, MD       DISCUSSION:78 y.o. former smoker with h/o HTN, Graves disease, CHB with pacemaker in place (device orders in 04/20/2023 progress note, device rep contacted), right shoulder OA scheduled for above procedure 04/30/2023 with Dr. Soulier Dozier.   Per cardiology preoperative evaluation 03/31/2023, Chart reviewed as part of pre-operative protocol coverage. Given past medical history and time since last visit, based on ACC/AHA guidelines, SUSSAN METER would be at acceptable risk for the planned procedure without further cardiovascular testing.    Her RCRI is low risk, 0.9% risk of major cardiac event. She is able to complete greater than 4 METS of physical activity.    Patient was advised that if she develops new symptoms prior to surgery to contact our office to arrange a follow-up appointment.  He verbalized understanding.  VS: BP 135/73   Pulse 64   Temp 36.9 C (Oral)   Resp 14   Ht 5' 2 (1.575 m)   Wt 70.8 kg   SpO2 98%   BMI 28.53 kg/m   PROVIDERS: Stephane Leita DEL, MD is PCP   Electrophysiologist- Elspeth Sage, MD  LABS: Labs reviewed: Acceptable for surgery. (all labs ordered are listed, but only abnormal results are displayed)  Labs Reviewed  BASIC METABOLIC PANEL - Abnormal; Notable for the following components:      Result Value   Creatinine, Ser 1.17 (*)    GFR, Estimated 48 (*)    All other components within normal limits  SURGICAL PCR SCREEN  CBC     IMAGES:   EKG:   CV:  Past Medical History:  Diagnosis Date   Abnormal nuclear stress test 12/15/2013   Anemia    hx iron def, no GI loss indentifed - resolved with diet change     Anxiety    Arthritis     Degenerative disc disease, cervical    Depression    Dysrhythmia    GERD (gastroesophageal reflux disease)    GRAVES' DISEASE 2013   I-131 ablation, now post tx hypothyroid state   History of hiatal hernia    HYPERTENSION    Impaired glucose tolerance test    Keloid 12/31/2010   incisional    Pacemaker -[employer]ik-CLS 08/20/2010   Fainted easily   Postablative hypothyroidism    Qualifier: Diagnosis of  By: Brien CMA, Nova     Pre-diabetes    Sciatica    Syncope 08/20/2010   s/p PPM 08/2010 for sxc pause on loop recorder causing same   Unspecified vitamin D deficiency     Past Surgical History:  Procedure Laterality Date   APPENDECTOMY     blephroplasty     BREAST BIOPSY Left 2001   guided excisional bx (L) breast microcalcification at 12 o'clock- Benign   BUNIONECTOMY Left    CATARACT EXTRACTION  2014   FRACTURE SURGERY     ORIF 06-2010   HIATAL HERNIA REPAIR N/A 04/10/2020   Procedure: LAPAROSCOPIC REPAIR OF TYPE 3 HIATAL HERNIA WITH NISSEN FUNDOPLICATION;  Surgeon: Gladis Cough, MD;  Location: [facility];  Service: General;  Laterality: N/A;   KNEE ARTHROSCOPY Left 10/28/2021   Procedure: ARTHROSCOPY KNEE;  Surgeon: Yvone Rush, MD;  Location: [facility];  Service: Orthopedics;  Laterality: Left;   LEFT HEART CATHETERIZATION WITH CORONARY ANGIOGRAM N/A 12/22/2013   Procedure: LEFT HEART CATHETERIZATION WITH CORONARY ANGIOGRAM;  Surgeon: Victory LELON Claudene DOUGLAS, MD;  Location: [facility] CATH LAB;  Service: Cardiovascular;  Laterality: N/A;   PACEMAKER INSERTION     Right ankle  06/2010   Trimalleolar fracture   TOTAL KNEE ARTHROPLASTY Left 03/17/2022   Procedure: TOTAL KNEE ARTHROPLASTY;  Surgeon: Yvone Rush, MD;  Location[facility];  Service: Orthopedics;  Laterality: Left;   TOTAL SHOULDER ARTHROPLASTY Left 01/27/2019   Procedure: TOTAL SHOULDER ARTHROPLASTY;  Surgeon: Dozier Soulier, MD;  Location[facility];  Service: Orthopedics;  Laterality: Left;    MEDICATIONS:  acidophilus  (RISAQUAD) CAPS capsule   ALPRAZolam  (XANAX ) 0.5 MG tablet   amLODipine  (NORVASC ) 5 MG tablet   buPROPion  (WELLBUTRIN  XL) 300 MG 24 hr tablet   levothyroxine  (SYNTHROID ) 112 MCG tablet   Multiple Vitamins-Minerals (MULTI-VITAMIN GUMMIES PO)   nebivolol  (BYSTOLIC ) 5 MG tablet   NON FORMULARY   PARoxetine  (PAXIL ) 10 MG tablet   pregabalin (LYRICA) 75 MG capsule   spironolactone  (ALDACTONE ) 50 MG tablet   tiZANidine  (ZANAFLEX ) 2 MG tablet   No current facility-administered medications for this encounter.    Harlene Hoots Ward, PA-C [facility] [phone]

## 2023-04-29 NOTE — Progress Notes (Signed)
Local rep paged for [employer], to make sure they are aware of the patients procedure for tomorrow.     Evern Bio BSN, Radio producer - Perioperative Services [place] [phone]

## 2023-04-30 ENCOUNTER — Encounter (HOSPITAL_COMMUNITY)
Admission: RE | Disposition: A | Discharge status: Home / Self Care | Payer: Self-pay | Source: Ambulatory Visit | Attending: Orthopedic Surgery

## 2023-04-30 ENCOUNTER — Other Ambulatory Visit: Payer: Self-pay

## 2023-04-30 ENCOUNTER — Ambulatory Visit (HOSPITAL_COMMUNITY)
Admission: RE | Admit: 2023-04-30 | Discharge: 2023-04-30 | Disposition: A | Discharge status: Home / Self Care | Payer: PPO | Source: Ambulatory Visit | Attending: Orthopedic Surgery | Admitting: Orthopedic Surgery

## 2023-04-30 ENCOUNTER — Ambulatory Visit (HOSPITAL_COMMUNITY): Payer: PPO | Admitting: Certified Registered Nurse Anesthetist

## 2023-04-30 ENCOUNTER — Ambulatory Visit (HOSPITAL_COMMUNITY): Payer: PPO | Admitting: Physician Assistant

## 2023-04-30 ENCOUNTER — Encounter (HOSPITAL_COMMUNITY): Payer: Self-pay | Admitting: Orthopedic Surgery

## 2023-04-30 DIAGNOSIS — M75101 Unspecified rotator cuff tear or rupture of right shoulder, not specified as traumatic: Secondary | ICD-10-CM | POA: Diagnosis not present

## 2023-04-30 DIAGNOSIS — E039 Hypothyroidism, unspecified: Secondary | ICD-10-CM

## 2023-04-30 DIAGNOSIS — G8918 Other acute postprocedural pain: Secondary | ICD-10-CM | POA: Diagnosis not present

## 2023-04-30 DIAGNOSIS — M19011 Primary osteoarthritis, right shoulder: Secondary | ICD-10-CM | POA: Insufficient documentation

## 2023-04-30 DIAGNOSIS — K219 Gastro-esophageal reflux disease without esophagitis: Secondary | ICD-10-CM | POA: Insufficient documentation

## 2023-04-30 DIAGNOSIS — Z96612 Presence of left artificial shoulder joint: Secondary | ICD-10-CM | POA: Diagnosis not present

## 2023-04-30 DIAGNOSIS — Z79890 Hormone replacement therapy (postmenopausal): Secondary | ICD-10-CM | POA: Diagnosis not present

## 2023-04-30 DIAGNOSIS — Z87891 Personal history of nicotine dependence: Secondary | ICD-10-CM | POA: Insufficient documentation

## 2023-04-30 DIAGNOSIS — Z96611 Presence of right artificial shoulder joint: Secondary | ICD-10-CM | POA: Diagnosis not present

## 2023-04-30 DIAGNOSIS — Z950 Presence of cardiac pacemaker: Secondary | ICD-10-CM | POA: Diagnosis not present

## 2023-04-30 DIAGNOSIS — E890 Postprocedural hypothyroidism: Secondary | ICD-10-CM | POA: Insufficient documentation

## 2023-04-30 DIAGNOSIS — I10 Essential (primary) hypertension: Secondary | ICD-10-CM | POA: Diagnosis not present

## 2023-04-30 HISTORY — PX: REVERSE SHOULDER ARTHROPLASTY: SHX5054

## 2023-04-30 SURGERY — ARTHROPLASTY, SHOULDER, TOTAL, REVERSE
Anesthesia: Regional | Site: Shoulder | Laterality: Right

## 2023-04-30 MED ORDER — DEXAMETHASONE SODIUM PHOSPHATE 10 MG/ML IJ SOLN
INTRAMUSCULAR | Status: DC | PRN
  Administered 2023-04-30: 5 mg via INTRAVENOUS

## 2023-04-30 MED ORDER — ONDANSETRON HCL 4 MG/2ML IJ SOLN
INTRAMUSCULAR | Status: AC
Start: 2023-04-30 — End: ?
  Filled 2023-04-30: qty 2

## 2023-04-30 MED ORDER — FENTANYL CITRATE (PF) 100 MCG/2ML IJ SOLN
INTRAMUSCULAR | Status: DC | PRN
  Administered 2023-04-30 (×2): 50 ug via INTRAVENOUS

## 2023-04-30 MED ORDER — LIDOCAINE HCL (PF) 2 % IJ SOLN
INTRAMUSCULAR | Status: AC
Start: 2023-04-30 — End: ?
  Filled 2023-04-30: qty 5

## 2023-04-30 MED ORDER — CHLORHEXIDINE GLUCONATE 0.12 % MT SOLN
15.0000 mL | Freq: Once | OROMUCOSAL | Status: AC
  Administered 2023-04-30: 15 mL via OROMUCOSAL

## 2023-04-30 MED ORDER — ORAL CARE MOUTH RINSE: 15.0000 mL | Freq: Once | OROMUCOSAL | Status: AC

## 2023-04-30 MED ORDER — LACTATED RINGERS IV SOLN: INTRAVENOUS | Status: DC

## 2023-04-30 MED ORDER — DEXAMETHASONE SODIUM PHOSPHATE 10 MG/ML IJ SOLN
INTRAMUSCULAR | Status: AC
  Filled 2023-04-30: qty 1

## 2023-04-30 MED ORDER — SUGAMMADEX SODIUM 200 MG/2ML IV SOLN
INTRAVENOUS | Status: DC | PRN
  Administered 2023-04-30: 200 mg via INTRAVENOUS

## 2023-04-30 MED ORDER — MIDAZOLAM HCL 5 MG/5ML IJ SOLN
INTRAMUSCULAR | Status: DC | PRN
  Administered 2023-04-30: 1 mg via INTRAVENOUS

## 2023-04-30 MED ORDER — OXYCODONE HCL 5 MG/5ML PO SOLN
5.0000 mg | Freq: Once | ORAL | Status: DC | PRN
Start: 2023-04-30 — End: 2023-04-30

## 2023-04-30 MED ORDER — PHENYLEPHRINE HCL-NACL 20-0.9 MG/250ML-% IV SOLN
INTRAVENOUS | Status: DC | PRN
  Administered 2023-04-30: 25 ug/min via INTRAVENOUS

## 2023-04-30 MED ORDER — SODIUM CHLORIDE 0.9 % IV SOLN: 12.5000 mg | INTRAVENOUS | Status: DC | PRN

## 2023-04-30 MED ORDER — ONDANSETRON HCL 4 MG/2ML IJ SOLN
INTRAMUSCULAR | Status: DC | PRN
  Administered 2023-04-30: 4 mg via INTRAVENOUS

## 2023-04-30 MED ORDER — WATER FOR IRRIGATION, STERILE IR SOLN
Status: DC | PRN
  Administered 2023-04-30: 1000 mL

## 2023-04-30 MED ORDER — BUPIVACAINE HCL (PF) 0.5 % IJ SOLN
INTRAMUSCULAR | Status: DC | PRN
  Administered 2023-04-30: 20 mL via PERINEURAL

## 2023-04-30 MED ORDER — TRANEXAMIC ACID-NACL 1000-0.7 MG/100ML-% IV SOLN
1000.0000 mg | INTRAVENOUS | Status: AC
  Administered 2023-04-30: 1000 mg via INTRAVENOUS
  Filled 2023-04-30: qty 100

## 2023-04-30 MED ORDER — LIDOCAINE 2% (20 MG/ML) 5 ML SYRINGE
INTRAMUSCULAR | Status: DC | PRN
  Administered 2023-04-30: 40 mg via INTRAVENOUS

## 2023-04-30 MED ORDER — FENTANYL CITRATE (PF) 100 MCG/2ML IJ SOLN
INTRAMUSCULAR | Status: AC
  Filled 2023-04-30: qty 2

## 2023-04-30 MED ORDER — HYDROMORPHONE HCL 1 MG/ML IJ SOLN: 0.2500 mg | INTRAMUSCULAR | Status: DC | PRN

## 2023-04-30 MED ORDER — PHENYLEPHRINE 80 MCG/ML (10ML) SYRINGE FOR IV PUSH (FOR BLOOD PRESSURE SUPPORT)
PREFILLED_SYRINGE | INTRAVENOUS | Status: DC | PRN
  Administered 2023-04-30: 80 ug via INTRAVENOUS

## 2023-04-30 MED ORDER — CEFAZOLIN SODIUM-DEXTROSE 2-4 GM/100ML-% IV SOLN
2.0000 g | INTRAVENOUS | Status: AC
  Administered 2023-04-30: 2 g via INTRAVENOUS
  Filled 2023-04-30: qty 100

## 2023-04-30 MED ORDER — AMISULPRIDE (ANTIEMETIC) 5 MG/2ML IV SOLN: 10.0000 mg | Freq: Once | INTRAVENOUS | Status: DC | PRN

## 2023-04-30 MED ORDER — PROPOFOL 10 MG/ML IV BOLUS
INTRAVENOUS | Status: AC
  Filled 2023-04-30: qty 20

## 2023-04-30 MED ORDER — ROCURONIUM BROMIDE 10 MG/ML (PF) SYRINGE
PREFILLED_SYRINGE | INTRAVENOUS | Status: DC | PRN
  Administered 2023-04-30: 70 mg via INTRAVENOUS

## 2023-04-30 MED ORDER — PROPOFOL 10 MG/ML IV BOLUS
INTRAVENOUS | Status: DC | PRN
  Administered 2023-04-30: 120 mg via INTRAVENOUS

## 2023-04-30 MED ORDER — OXYCODONE HCL 5 MG PO TABS: 5.0000 mg | ORAL_TABLET | ORAL | 0 refills | Status: DC | PRN

## 2023-04-30 MED ORDER — OXYCODONE HCL 5 MG PO TABS: 5.0000 mg | ORAL_TABLET | Freq: Once | ORAL | Status: DC | PRN

## 2023-04-30 MED ORDER — EPHEDRINE SULFATE-NACL 50-0.9 MG/10ML-% IV SOSY
PREFILLED_SYRINGE | INTRAVENOUS | Status: DC | PRN
  Administered 2023-04-30: 5 mg via INTRAVENOUS

## 2023-04-30 MED ORDER — LIDOCAINE HCL (PF) 2 % IJ SOLN
INTRAMUSCULAR | Status: AC
  Filled 2023-04-30: qty 5

## 2023-04-30 MED ORDER — ROCURONIUM BROMIDE 10 MG/ML (PF) SYRINGE
PREFILLED_SYRINGE | INTRAVENOUS | Status: AC
  Filled 2023-04-30: qty 10

## 2023-04-30 MED ORDER — BUPIVACAINE LIPOSOME 1.3 % IJ SUSP
INTRAMUSCULAR | Status: DC | PRN
  Administered 2023-04-30: 10 mL via PERINEURAL

## 2023-04-30 MED ORDER — SODIUM CHLORIDE 0.9 % IR SOLN
Status: DC | PRN
  Administered 2023-04-30: 1000 mL

## 2023-04-30 MED ORDER — ONDANSETRON HCL 4 MG PO TABS: 4.0000 mg | ORAL_TABLET | Freq: Three times a day (TID) | ORAL | 0 refills | Status: DC | PRN

## 2023-04-30 MED ORDER — MIDAZOLAM HCL 2 MG/2ML IJ SOLN
INTRAMUSCULAR | Status: AC
  Filled 2023-04-30: qty 2

## 2023-04-30 SURGICAL SUPPLY — 71 items
BAG COUNTER SPONGE SURGICOUNT (BAG) IMPLANT
BAG ZIPLOCK 12X15 (MISCELLANEOUS) ×2 IMPLANT
BASEPLATE P2 COATD GLND 6.5X30 (Shoulder) IMPLANT
BIT DRILL 1.6MX128 (BIT) IMPLANT
BIT DRILL 2.5 DIA 127 CALI (BIT) IMPLANT
BIT DRILL 4 DIA CALIBRATED (BIT) IMPLANT
BLADE SAW SGTL 73X25 THK (BLADE) ×2 IMPLANT
BOOTIES KNEE HIGH SLOAN (MISCELLANEOUS) ×4 IMPLANT
COOLER ICEMAN CLASSIC (MISCELLANEOUS) ×2 IMPLANT
COVER BACK TABLE 60X90IN (DRAPES) ×2 IMPLANT
COVER SURGICAL LIGHT HANDLE (MISCELLANEOUS) ×2 IMPLANT
DRAPE INCISE IOBAN 66X45 STRL (DRAPES) ×2 IMPLANT
DRAPE POUCH INSTRU U-SHP 10X18 (DRAPES) ×2 IMPLANT
DRAPE SHEET LG 3/4 BI-LAMINATE (DRAPES) ×2 IMPLANT
DRAPE SURG 17X11 SM STRL (DRAPES) ×2 IMPLANT
DRAPE SURG ORHT 6 SPLT 77X108 (DRAPES) ×4 IMPLANT
DRAPE TOP 10253 STERILE (DRAPES) ×2 IMPLANT
DRAPE U-SHAPE 47X51 STRL (DRAPES) ×2 IMPLANT
DRSG AQUACEL AG ADV 3.5X 6 (GAUZE/BANDAGES/DRESSINGS) ×2 IMPLANT
DURAPREP 26ML APPLICATOR (WOUND CARE) ×4 IMPLANT
ELECT BLADE TIP CTD 4 INCH (ELECTRODE) ×2 IMPLANT
ELECT REM PT RETURN 15FT ADLT (MISCELLANEOUS) ×2 IMPLANT
FACESHIELD WRAPAROUND (MASK) ×1 IMPLANT
FACESHIELD WRAPAROUND OR TEAM (MASK) ×2 IMPLANT
GLOVE BIO SURGEON STRL SZ7.5 (GLOVE) ×2 IMPLANT
GLOVE BIOGEL PI IND STRL 6.5 (GLOVE) ×2 IMPLANT
GLOVE BIOGEL PI IND STRL 8 (GLOVE) ×2 IMPLANT
GLOVE SURG SS PI 6.5 STRL IVOR (GLOVE) ×2 IMPLANT
GOWN STRL REUS W/ TWL LRG LVL3 (GOWN DISPOSABLE) ×2 IMPLANT
GOWN STRL REUS W/ TWL XL LVL3 (GOWN DISPOSABLE) ×2 IMPLANT
HOOD PEEL AWAY T7 (MISCELLANEOUS) ×6 IMPLANT
HUMERA STEM SM SHELL SHOU 10 (Miscellaneous) ×1 IMPLANT
INSERT SMALL SOCKET 32MM NEU (Insert) IMPLANT
KIT BASIN OR (CUSTOM PROCEDURE TRAY) ×2 IMPLANT
KIT TURNOVER KIT A (KITS) IMPLANT
MANIFOLD NEPTUNE II (INSTRUMENTS) ×2 IMPLANT
NDL TROCAR POINT SZ 2 1/2 (NEEDLE) IMPLANT
NEEDLE TROCAR POINT SZ 2 1/2 (NEEDLE) IMPLANT
NS IRRIG 1000ML POUR BTL (IV SOLUTION) ×2 IMPLANT
P2 COATDE GLNOID BSEPLT 6.5X30 (Shoulder) ×1 IMPLANT
PACK SHOULDER (CUSTOM PROCEDURE TRAY) ×2 IMPLANT
PAD COLD SHLDR WRAP-ON (PAD) ×2 IMPLANT
PROTECTOR NERVE ULNAR (MISCELLANEOUS) IMPLANT
RESTRAINT HEAD UNIVERSAL NS (MISCELLANEOUS) ×2 IMPLANT
RETRIEVER SUT HEWSON (MISCELLANEOUS) IMPLANT
SCREW BONE LOCKING RSP 5.0X14 (Screw) ×1 IMPLANT
SCREW BONE RSP LOCK 5X14 (Screw) IMPLANT
SCREW BONE RSP LOCK 5X22 (Screw) IMPLANT
SCREW BONE RSP LOCK 5X26 (Screw) IMPLANT
SCREW BONE RSP LOCKING 5.0X26 (Screw) ×2 IMPLANT
SCREW BONE RSP LOCKING 5.0X32 (Screw) ×1 IMPLANT
SCREW RETAIN W/HEAD 4MM OFFSET (Shoulder) IMPLANT
SET HNDPC FAN SPRY TIP SCT (DISPOSABLE) ×2 IMPLANT
SLING ARM IMMOBILIZER LRG (SOFTGOODS) IMPLANT
SLING ARM IMMOBILIZER MED (SOFTGOODS) IMPLANT
STEM HUMERAL SM SHELL SHOU 10 (Miscellaneous) IMPLANT
STRIP CLOSURE SKIN 1/2X4 (GAUZE/BANDAGES/DRESSINGS) ×2 IMPLANT
SUCTION TUBE FRAZIER 10FR DISP (SUCTIONS) IMPLANT
SUPPORT WRAP ARM LG (MISCELLANEOUS) ×2 IMPLANT
SUT ETHIBOND 2 V 37 (SUTURE) IMPLANT
SUT FIBERWIRE #2 38 REV NDL BL (SUTURE) IMPLANT
SUT FIBERWIRE #2 38 T-5 BLUE (SUTURE) ×1 IMPLANT
SUT MNCRL AB 4-0 PS2 18 (SUTURE) ×2 IMPLANT
SUT VIC AB 2-0 CT1 TAPERPNT 27 (SUTURE) ×4 IMPLANT
SUTURE FIBERWR #2 38 T-5 BLUE (SUTURE) IMPLANT
SUTURE FIBERWR#2 38 REV NDL BL (SUTURE) IMPLANT
TAP SURG THRD DJ 6.5 (ORTHOPEDIC DISPOSABLE SUPPLIES) IMPLANT
TAPE LABRALWHITE 1.5X36 (TAPE) IMPLANT
TAPE SUT LABRALTAP WHT/BLK (SUTURE) IMPLANT
TOWEL OR 17X26 10 PK STRL BLUE (TOWEL DISPOSABLE) ×2 IMPLANT
WATER STERILE IRR 1000ML POUR (IV SOLUTION) ×2 IMPLANT

## 2023-04-30 NOTE — Evaluation (Signed)
Occupational Therapy Evaluation Patient Details Name: Cindy [name] MRN: [id number] DOB: Jun 19, 1945 Today's Date: 04/30/2023   History of Present Illness 78yo female s/p R reverse TSA. PMH: L-TKA  03/17/22,GERD, HTN, pacemaker, DM, sciatica, Syncope, L-TSA 2020.   Clinical Impression   PTA pt lives independently with her husband.  Education completed regarding compensatory strategies for ADL tasks and functional mobility, management of sling, RUE ROM per specified parameters in the order set as indicated below, positioning of operative arm in sitting and supine and edema control, including use of "Iceman" Cold Therapy machine. Caregiver present for education, written handouts provided and reviewed using Teach Back and pt/caregiver verbalized/demonstrated understanding. Due to the below listed deficits, pt requires min assistance with ADL tasks and CGA with functional mobility. Caregiver will be able to provide necessary level of assistance at discharge. Pt to follow up with MD to progress rehab of the operative shoulder.        If plan is discharge home, recommend the following: A little help with walking and/or transfers;A little help with bathing/dressing/bathroom;Assistance with cooking/housework;Assist for transportation    Functional Status Assessment  Patient has had a recent decline in their functional status and demonstrates the ability to make significant improvements in function in a reasonable and predictable amount of time.  Equipment Recommendations  None recommended by OT    Recommendations for Other Services       Precautions / Restrictions Precautions Precautions: Shoulder; Shoulder FF 0-60; ER 0-20; Abd 0-45. ROM for ADL purposes only; AROM elbow/wrist/hand; OK for lap slides and pendulums; Loosen sling from neck when arm is supported in sitting  Shoulder Interventions: At all times;Off for dressing/bathing/exercises;Shoulder sling/immobilizer Precaution Booklet  Issued: Yes (comment) Restrictions Weight Bearing Restrictions Per Provider Order: Yes RUE Weight Bearing Per Provider Order: Non weight bearing      Mobility Bed Mobility               General bed mobility comments: Pt has an adjustable bed; plans to sleep in recliner inititally    Transfers Overall transfer level: Needs assistance   Transfers: Sit to/from Stand Sit to Stand: Contact guard assist           General transfer comment: expected post op unsteadiness; husband educated on how to assist/berblaized understanding      Balance Overall balance assessment: Mild deficits observed, not formally tested                                         ADL either performed or assessed with clinical judgement   ADL Overall ADL's : Needs assistance/impaired Eating/Feeding: Set up   Grooming: Minimal assistance   Upper Body Bathing: Moderate assistance   Lower Body Bathing: Minimal assistance   Upper Body Dressing : Moderate assistance   Lower Body Dressing: Minimal assistance               Functional mobility during ADLs: Contact guard assist Per orders, R shoulder parameters as follows for ADL tasks: Abd 0-45; ER 0-20; FF 0-60. While moving within specified parameters, pt/caregiver instructed on bathing and how to donn/doff shirt, placing operative arm through sleeve first when donning and off last when doffing.Pt/caregiver educated on compensatory strategies for LB ADL and strategies to reduce risk of falls.  Pt/caregiver educated on donning/doffing sling and to wear the sling at all times with the exception of ADL, and to loosen  the neck strap of the sling when the operative arm is in a supported position when sitting. In sitting or supine, pt instructed to have a pillow behind and under their operative arm to provide support. If assist needed with ambulation, caregiver educated on the importance of walking on pt's non-operative side.  Education  regarding use of "IceMan" Cold Therapy completed, including the importance of using a barrier on the shoulder prior to positioning the wrap-on pad. Pt/caregiver verbalized/demonstrated understanding. Teach Back used while caregiver assisted with dressing pt and positioning "wrap-on pad" to facilitate DC.        Vision Baseline Vision/History: 1 Wears glasses       Perception         Praxis         Pertinent Vitals/Pain Pain Assessment Pain Assessment: No/denies pain     Extremity/Trunk Assessment Upper Extremity Assessment Upper Extremity Assessment: Right hand dominant;RUE deficits/detail RUE Deficits / Details: block active; elbow/wrist/hand AA/PROM RUE Coordination: decreased fine motor;decreased gross motor   Lower Extremity Assessment Lower Extremity Assessment: Overall WFL for tasks assessed (hx of L TKA)   Cervical / Trunk Assessment Cervical / Trunk Assessment: Normal   Communication Communication Communication: No apparent difficulties   Cognition Arousal: Alert Behavior During Therapy: WFL for tasks assessed/performed Overall Cognitive Status: Within Functional Limits for tasks assessed                                       General Comments       Exercises Exercises: Shoulder Shoulder Exercises Pendulum Exercise: Right, Other (comment) (standing/dangle) Elbow Flexion: AAROM, Right, 5 reps Elbow Extension: AAROM, Right, 5 reps Wrist Flexion: AAROM, Right, 5 reps Wrist Extension: AAROM, Right, 5 reps Digit Composite Flexion: AAROM, Right, 5 reps Composite Extension: AAROM, Right, 5 reps Neck Flexion: AROM, 5 reps Neck Extension: AROM, 5 reps Neck Lateral Flexion - Right: AROM, 5 reps Neck Lateral Flexion - Left: AROM, 5 reps   Shoulder Instructions Shoulder Instructions Donning/doffing shirt without moving shoulder: Caregiver independent with task;Patient able to independently direct caregiver Method for sponge bathing under operated  UE: Caregiver independent with task;Patient able to independently direct caregiver Donning/doffing sling/immobilizer: Caregiver independent with task;Patient able to independently direct caregiver Correct positioning of sling/immobilizer: Caregiver independent with task;Patient able to independently direct caregiver Pendulum exercises (written home exercise program): Caregiver independent with task;Patient able to independently direct caregiver ROM for elbow, wrist and digits of operated UE: Caregiver independent with task;Patient able to independently direct caregiver Sling wearing schedule (on at all times/off for ADL's): Caregiver independent with task;Patient able to independently direct caregiver Proper positioning of operated UE when showering: Caregiver independent with task;Patient able to independently direct caregiver Positioning of UE while sleeping: Caregiver independent with task;Patient able to independently direct caregiver    Home Living Family/patient expects to be discharged to:: Private residence Living Arrangements: Spouse/significant other Available Help at Discharge: Family;Available 24 hours/day Type of Home: House Home Access: Stairs to enter [employer] of Steps: 2 Entrance Stairs-Rails: Left Home Layout: One level     Bathroom Shower/Tub: Producer, television/film/video: Standard Bathroom Accessibility: Yes How Accessible: Accessible via [place] Home Equipment: Shower seat;Grab bars - tub/shower;Hand held shower head          Prior Functioning/Environment Prior Level of Function : Independent/Modified Independent;Driving  OT Problem List: Decreased strength;Decreased range of motion;Decreased safety awareness;Decreased knowledge of precautions;Impaired UE functional use      OT Treatment/Interventions:      OT Goals(Current goals can be found in the care plan section) Acute Rehab OT Goals Patient Stated Goal:  home today OT Goal Formulation: All assessment and education complete, DC therapy  OT Frequency:      Co-evaluation              AM-PAC OT "6 Clicks" Daily Activity     Outcome Measure Help from another person eating meals?: A Little Help from another person taking care of personal grooming?: A Little Help from another person toileting, which includes using toliet, bedpan, or urinal?: A Little Help from another person bathing (including washing, rinsing, drying)?: A Little Help from another person to put on and taking off regular upper body clothing?: A Little Help from another person to put on and taking off regular lower body clothing?: A Little 6 Click Score: 18   End of Session Nurse Communication: Other (comment) (ready for DC)  Activity Tolerance: Patient tolerated treatment well Patient left: Other (comment);in chair;with family/visitor present  OT Visit Diagnosis: Muscle weakness (generalized) (M62.81)                Time: 1610-9604 OT Time Calculation (min): 37 min Charges:  OT General Charges $OT Visit: 1 Visit OT Evaluation $OT Eval Low Complexity: 1 Low OT Treatments $Self Care/Home Management : 8-22 mins  Luisa Dago, OT/L   Acute OT Clinical Specialist Acute Rehabilitation Services Pager [phone] Office [phone]   [facility] 04/30/2023, 2:02 PM

## 2023-04-30 NOTE — H&P (Signed)
Cindy [name] is an 78 y.o. female.   Chief Complaint: R shoulder pain and dysfunction HPI: Endstage R shoulder arthritis with significant pain and dysfunction, failed conservative measures.  Pain interferes with sleep and quality of life.   Past Medical History:  Diagnosis Date   Abnormal nuclear stress test 12/15/2013   Anemia    hx iron def, no GI loss indentifed - resolved with diet change     Anxiety    Arthritis    Degenerative disc disease, cervical    Depression    Dysrhythmia    GERD (gastroesophageal reflux disease)    GRAVES' DISEASE 2013   I-131 ablation, now post tx hypothyroid state   History of hiatal hernia    HYPERTENSION    Impaired glucose tolerance test    Keloid 12/31/2010   incisional    Pacemaker -[employer]-CLS 08/20/2010   Fainted easily   Postablative hypothyroidism    Qualifier: Diagnosis of  By: Delford Field CMA, Nova     Pre-diabetes    Sciatica    Syncope 08/20/2010   s/p PPM 08/2010 for sxc pause on loop recorder causing same   Unspecified vitamin D deficiency     Past Surgical History:  Procedure Laterality Date   APPENDECTOMY     blephroplasty     BREAST BIOPSY Left 2001   guided excisional bx (L) breast microcalcification at 12 o'clock- Benign   BUNIONECTOMY Left    CATARACT EXTRACTION  2014   FRACTURE SURGERY     ORIF 06-2010   HIATAL HERNIA REPAIR N/A 04/10/2020   Procedure: LAPAROSCOPIC REPAIR OF TYPE 3 HIATAL HERNIA WITH NISSEN FUNDOPLICATION;  Surgeon: Luretha Murphy, MD;  Location: [facility];  Service: General;  Laterality: N/A;   KNEE ARTHROSCOPY Left 10/28/2021   Procedure: ARTHROSCOPY KNEE;  Surgeon: Jodi Geralds, MD;  Location: [facility];  Service: Orthopedics;  Laterality: Left;   LEFT HEART CATHETERIZATION WITH CORONARY ANGIOGRAM N/A 12/22/2013   Procedure: LEFT HEART CATHETERIZATION WITH CORONARY ANGIOGRAM;  Surgeon: Lesleigh Noe, MD;  Location: [facility] CATH LAB;  Service: Cardiovascular;  Laterality: N/A;   PACEMAKER INSERTION      Right ankle  06/2010   Trimalleolar fracture   TOTAL KNEE ARTHROPLASTY Left 03/17/2022   Procedure: TOTAL KNEE ARTHROPLASTY;  Surgeon: Jodi Geralds, MD;  Location: [facility];  Service: Orthopedics;  Laterality: Left;   TOTAL SHOULDER ARTHROPLASTY Left 01/27/2019   Procedure: TOTAL SHOULDER ARTHROPLASTY;  Surgeon: Jones Broom, MD;  Location: [facility];  Service: Orthopedics;  Laterality: Left;    Family History  Problem Relation Age of Onset   Rheum arthritis Mother    Emphysema Mother    Hypertension Mother    Osteoporosis Mother    COPD Mother    Prostate cancer Father    Hypertension Father    Heart attack Father    Osteoporosis Maternal Grandmother    Stroke Other    Social History:  reports that she quit smoking about 56 years ago. Her smoking use included cigarettes. She has never used smokeless tobacco. She reports current alcohol use of about 2.0 standard drinks of alcohol per week. She reports that she does not use drugs.  Allergies:  Allergies  Allergen Reactions   Ace Inhibitors Swelling   Lisinopril Swelling    angiodema   Codeine Nausea And Vomiting    Medications Prior to Admission  Medication Sig Dispense Refill   acidophilus (RISAQUAD) CAPS capsule Take 1 capsule by mouth daily.     ALPRAZolam Prudy Feeler)  0.5 MG tablet Take 0.25 mg by mouth daily as needed for anxiety or sleep.  0   amLODipine (NORVASC) 5 MG tablet TAKE 1 TABLET BY MOUTH EVERY DAY 90 tablet 2   buPROPion (WELLBUTRIN XL) 300 MG 24 hr tablet Take 150 mg by mouth every morning.     levothyroxine (SYNTHROID) 112 MCG tablet Take 112 mcg by mouth daily before breakfast.     Multiple Vitamins-Minerals (MULTI-VITAMIN GUMMIES PO) Take 2 tablets by mouth daily.     nebivolol (BYSTOLIC) 5 MG tablet Take 5 mg by mouth daily.     NON FORMULARY Take 1 Piece by mouth daily as needed (pain). CBD Gummy     PARoxetine (PAXIL) 10 MG tablet Take 10 mg by mouth daily.     pregabalin (LYRICA) 75 MG capsule Take 75  mg by mouth daily.     spironolactone (ALDACTONE) 50 MG tablet Take 50 mg by mouth daily.     tiZANidine (ZANAFLEX) 2 MG tablet Take 1 tablet (2 mg total) by mouth every 8 (eight) hours as needed for muscle spasms. 40 tablet 0    No results found for this or any previous visit (from the past 48 hours). No results found.  Review of Systems  All other systems reviewed and are negative.   Blood pressure (!) 146/69, pulse 65, temperature 98.5 F (36.9 C), temperature source Oral, resp. rate 14, height 5\' 2"  (1.575 m), weight 71.2 kg, SpO2 97%. Physical Exam HENT:     Head: Atraumatic.  Cardiovascular:     Pulses: Normal pulses.  Pulmonary:     Effort: Pulmonary effort is normal.  Musculoskeletal:     Comments: R shoulder pain with limited ROM NVID  Neurological:     Mental Status: She is alert.      Assessment/Plan Endstage R shoulder arthritis with RCT with significant pain and dysfunction, failed conservative measures.   Plan R reverse TSA Risks / benefits of surgery discussed Consent on chart  NPO for OR Preop antibiotics   Glennon Hamilton, MD 04/30/2023, 6:34 AM

## 2023-04-30 NOTE — Transfer of Care (Signed)
Immediate Anesthesia Transfer of Care Note  Patient: Cindy [name]  Procedure(s) Performed: REVERSE SHOULDER ARTHROPLASTY (Right: Shoulder)  Patient Location: PACU  Anesthesia Type:General  Level of Consciousness: awake, alert , and oriented  Airway & Oxygen Therapy: Patient Spontanous Breathing and Patient connected to face mask oxygen  Post-op Assessment: Report given to RN and Post -op Vital signs reviewed and stable  Post vital signs: Reviewed and stable  Last Vitals:  Vitals Value Taken Time  BP 146/72 04/30/23 0852  Temp    Pulse 62 04/30/23 0855  Resp 17 04/30/23 0855  SpO2 95 % 04/30/23 0855  Vitals shown include unfiled device data.  Last Pain:  Vitals:   04/30/23 0602  TempSrc: Oral  PainSc: 0-No pain         Complications: No notable events documented.

## 2023-04-30 NOTE — Anesthesia Procedure Notes (Signed)
Procedure Name: Intubation Date/Time: 04/30/2023 7:41 AM  Performed by: Orest Dikes, CRNAPre-anesthesia Checklist: Patient identified, Emergency Drugs available, Suction available and Patient being monitored Patient Re-evaluated:Patient Re-evaluated prior to induction Oxygen Delivery Method: Circle system utilized Preoxygenation: Pre-oxygenation with 100% oxygen Induction Type: IV induction Ventilation: Mask ventilation without difficulty Laryngoscope Size: Mac and 4 Grade View: Grade I Tube type: Oral Tube size: 7.0 mm Number of attempts: 1 Airway Equipment and Method: Stylet Placement Confirmation: ETT inserted through vocal cords under direct vision, positive ETCO2 and breath sounds checked- equal and bilateral Secured at: 21 cm Tube secured with: Tape Dental Injury: Teeth and Oropharynx as per pre-operative assessment

## 2023-04-30 NOTE — Anesthesia Preprocedure Evaluation (Signed)
Anesthesia Evaluation  Patient identified by MRN, date of birth, ID band Patient awake    Reviewed: Allergy & Precautions, NPO status , Patient's Chart, lab work & pertinent test results  History of Anesthesia Complications Negative for: history of anesthetic complications  Airway Mallampati: II  TM Distance: >3 FB Neck ROM: Full    Dental  (+) Dental Advisory Given   Pulmonary neg shortness of breath, neg recent URI, former smoker   breath sounds clear to auscultation       Cardiovascular hypertension, Pt. on medications (-) angina (-) Past MI and (-) CHF + dysrhythmias + pacemaker  Rhythm:Regular  [employer] pacer dddr 43% RA pacing   Neuro/Psych  PSYCHIATRIC DISORDERS Anxiety Depression     Neuromuscular disease    GI/Hepatic Neg liver ROS,GERD  ,,  Endo/Other  Hypothyroidism    Renal/GU negative Renal ROS     Musculoskeletal  (+) Arthritis , Osteoarthritis,    Abdominal   Peds  Hematology   Anesthesia Other Findings   Reproductive/Obstetrics                             Anesthesia Physical Anesthesia Plan  ASA: 3  Anesthesia Plan: General and Regional   Post-op Pain Management: Minimal or no pain anticipated   Induction: Intravenous  PONV Risk Score and Plan: 3 and Ondansetron, Dexamethasone, Midazolam and Treatment may vary due to age or medical condition  Airway Management Planned: Oral ETT  Additional Equipment: None  Intra-op Plan:   Post-operative Plan: Extubation in OR  Informed Consent: I have reviewed the patients History and Physical, chart, labs and discussed the procedure including the risks, benefits and alternatives for the proposed anesthesia with the patient or authorized representative who has indicated his/her understanding and acceptance.     Dental advisory given  Plan Discussed with: CRNA and Surgeon  Anesthesia Plan Comments: (See PAT note  01/17/2019, Jodell Cipro, PA-C)        Anesthesia Quick Evaluation

## 2023-04-30 NOTE — Discharge Instructions (Addendum)
Discharge Instructions after Reverse Total Shoulder Arthroplasty   . A sling has been provided for you. You are to wear this at all times (except for bathing and dressing), until your first post operative visit with Dr. Chandler. Please also wear while sleeping at night. While you bath and dress, let the arm/elbow extend straight down to stretch your elbow. Wiggle your fingers and pump your first while your in the sling to prevent hand swelling. . Use ice on the shoulder intermittently over the first 48 hours after surgery. Continue to use ice or and ice machine as needed after 48 hours for pain control/swelling.  . Pain medicine has been prescribed for you.  . Use your medicine liberally over the first 48 hours, and then you can begin to taper your use. You may take Extra Strength Tylenol or Tylenol only in place of the pain pills. DO NOT take ANY nonsteroidal anti-inflammatory pain medications: Advil, Motrin, Ibuprofen, Aleve, Naproxen or Naprosyn.  . Take one aspirin a day for 2 weeks after surgery, unless you have an aspirin sensitivity/allergy or asthma.  . Leave your dressing on until your first follow up visit.  You may shower with the dressing.  Hold your arm as if you still have your sling on while you shower. . Simply allow the water to wash over the site and then pat dry. Make sure your axilla (armpit) is completely dry after showering.    Please call [phone] during normal business hours or [phone] after hours for any problems. Including the following:  - excessive redness of the incisions - drainage for more than 4 days - fever of more than 101.5 F  *Please note that pain medications will not be refilled after hours or on weekends.    Dental Antibiotics:  In most cases prophylactic antibiotics for Dental procdeures after total joint surgery are not necessary.  Exceptions are as follows:  1. History of prior total joint infection  2. Severely immunocompromised  (Organ Transplant, cancer chemotherapy, Rheumatoid biologic meds such as Humera)  3. Poorly controlled diabetes (A1C &gt; 8.0, blood glucose over 200)  If you have one of these conditions, contact your surgeon for an antibiotic prescription, prior to your dental procedure. 

## 2023-04-30 NOTE — Anesthesia Postprocedure Evaluation (Signed)
Anesthesia Post Note  Patient: Cindy [name]  Procedure(s) Performed: REVERSE SHOULDER ARTHROPLASTY (Right: Shoulder)     Patient location during evaluation: PACU Anesthesia Type: Regional and General Level of consciousness: awake and alert Pain management: pain level controlled Vital Signs Assessment: post-procedure vital signs reviewed and stable Respiratory status: spontaneous breathing, nonlabored ventilation and respiratory function stable Cardiovascular status: blood pressure returned to baseline and stable Postop Assessment: no apparent nausea or vomiting Anesthetic complications: no   No notable events documented.  Last Vitals:  Vitals:   04/30/23 0938 04/30/23 0945  BP: (!) 146/81 (!) 149/68  Pulse: 87 63  Resp: 17   Temp: 36.4 C   SpO2: 94% 94%    Last Pain:  Vitals:   04/30/23 0938  TempSrc:   PainSc: 0-No pain                 Lowella Curb

## 2023-04-30 NOTE — Op Note (Signed)
Procedure(s): REVERSE SHOULDER ARTHROPLASTY Procedure Note  Cindy [name] female 78 y.o. 04/30/2023  Preoperative diagnosis: Right shoulder end-stage osteoarthritis with significant rotator cuff disease  Postoperative diagnosis: Same  Procedure(s) and Anesthesia Type:    * REVERSE SHOULDER ARTHROPLASTY - General   Indications:  78 y.o. female  With endstage right shoulder arthritis with rotator cuff tear. Pain and dysfunction interfered with quality of life and nonoperative treatment with activity modification, NSAIDS and injections failed.     Surgeon: Glennon Hamilton   Assistants: Fredia Sorrow PA-C [name] was present and scrubbed throughout the procedure and was essential in positioning, retraction, exposure, and closure)  Anesthesia: General endotracheal anesthesia with preoperative interscalene block given by the attending anesthesiologist    Procedure Detail  REVERSE SHOULDER ARTHROPLASTY   Estimated Blood Loss:  200 mL         Drains: none  Blood Given: none          Specimens: none        Complications:  * No complications entered in OR log *         Disposition: PACU - hemodynamically stable.         Condition: stable      OPERATIVE FINDINGS:  A DJO Altivate pressfit reverse total shoulder arthroplasty was placed with a  size 10 stem, a 32-4 glenosphere, and a standard-mm poly insert. The base plate  fixation was excellent.  PROCEDURE: The patient was identified in the preoperative holding area  where I personally marked the operative site after verifying site, side,  and procedure with the patient. An interscalene block given by  the attending anesthesiologist in the holding area and the patient was taken back to the operating room where all extremities were  carefully padded in position after general anesthesia was induced. She  was placed in a beach-chair position and the operative upper extremity was  prepped and draped in a standard  sterile fashion. An approximately 10-  cm incision was made from the tip of the coracoid process to the center  point of the humerus at the level of the axilla. Dissection was carried  down through subcutaneous tissues to the level of the cephalic vein  which was taken laterally with the deltoid. The pectoralis major was  retracted medially. The subdeltoid space was developed and the lateral  edge of the conjoined tendon was identified. The undersurface of  conjoined tendon was palpated and the musculocutaneous nerve was not in  the field. Retractor was placed underneath the conjoined and second  retractor was placed lateral into the deltoid. The circumflex humeral  artery and vessels were identified and clamped and coagulated. The  biceps tendon was tenotomized.  The subscapularis was taken down as a peel.  The  joint was then gently externally rotated while the capsule was released  from the humeral neck around to just beyond the 6 o'clock position. At  this point, the joint was dislocated and the humeral head was presented  into the wound. The excessive osteophyte formation was removed with a  large rongeur.  The cutting guide was used to make the appropriate  head cut and the head was saved for potentially bone grafting.  The glenoid was exposed with the arm in an  abducted extended position. The anterior and posterior labrum were  completely excised and the capsule was released circumferentially to  allow for exposure of the glenoid for preparation. The 2.5 mm drill was  placed using the guide in  5-10 inferior angulation and the tap was then advanced in the same hole. Small and large reamers were then used. The tap was then removed and the Metaglene was then screwed in with excellent purchase.  The peripheral guide was then used to drilled measured and filled peripheral locking screws. The size 32-4 glenosphere was then impacted on the [facility] taper and the central screw was placed. The  humerus was then again exposed and the diaphyseal reamers were used followed by the metaphyseal reamers. The final broach was left in place in the proximal trial was placed. The joint was reduced and with this implant it was felt that soft tissue tensioning was appropriate with excellent stability and excellent range of motion. Therefore, final humeral stem was placed press-fit.  And then the trial polyethylene inserts were tested again and the above implant was felt to be the most appropriate for final insertion. The joint was reduced taken through full range of motion and felt to be stable. Soft tissue tension was appropriate.  The joint was then copiously irrigated with pulse  lavage and the wound was then closed. The subscapularis was repaired loosely with a #2 FiberWire through bone tunnel.  Skin was closed with 2-0 Vicryl in a deep dermal layer and 4-0  Monocryl for skin closure. Steri-Strips were applied. Sterile  dressings were then applied as well as a sling. The patient was allowed  to awaken from general anesthesia, transferred to stretcher, and taken  to recovery room in stable condition.   POSTOPERATIVE PLAN: The patient will be observed in the recovery room and if her pain is well-controlled with regional anesthesia and she is hemodynamically stable she could be discharged home today with family.

## 2023-04-30 NOTE — Anesthesia Procedure Notes (Signed)
Anesthesia Regional Block: Interscalene brachial plexus block   Pre-Anesthetic Checklist: , timeout performed,  Correct Patient, Correct Site, Correct Laterality,  Correct Procedure, Correct Position, site marked,  Risks and benefits discussed,  Surgical consent,  Pre-op evaluation,  At surgeon's request and post-op pain management  Laterality: Right  Prep: chloraprep       Needles:  Injection technique: Single-shot  Needle Type: Stimiplex     Needle Length: 9cm  Needle Gauge: 21     Additional Needles:   Procedures:,,,, ultrasound used (permanent image in chart),,    Narrative:  Start time: 04/30/2023 7:10 AM End time: 04/30/2023 7:15 AM Injection made incrementally with aspirations every 5 mL.  Performed by: Personally  Anesthesiologist: Lowella Curb, MD

## 2023-05-01 ENCOUNTER — Encounter (HOSPITAL_COMMUNITY): Payer: Self-pay | Admitting: Orthopedic Surgery

## 2023-05-15 DIAGNOSIS — M19011 Primary osteoarthritis, right shoulder: Secondary | ICD-10-CM | POA: Diagnosis not present

## 2023-05-20 DIAGNOSIS — M25511 Pain in right shoulder: Secondary | ICD-10-CM | POA: Diagnosis not present

## 2023-05-20 DIAGNOSIS — Z96611 Presence of right artificial shoulder joint: Secondary | ICD-10-CM | POA: Diagnosis not present

## 2023-05-27 DIAGNOSIS — Z96611 Presence of right artificial shoulder joint: Secondary | ICD-10-CM | POA: Diagnosis not present

## 2023-05-27 DIAGNOSIS — M25511 Pain in right shoulder: Secondary | ICD-10-CM | POA: Diagnosis not present

## 2023-05-29 DIAGNOSIS — M25511 Pain in right shoulder: Secondary | ICD-10-CM | POA: Diagnosis not present

## 2023-05-29 DIAGNOSIS — Z96611 Presence of right artificial shoulder joint: Secondary | ICD-10-CM | POA: Diagnosis not present

## 2023-06-02 DIAGNOSIS — Z96611 Presence of right artificial shoulder joint: Secondary | ICD-10-CM | POA: Diagnosis not present

## 2023-06-02 DIAGNOSIS — M25511 Pain in right shoulder: Secondary | ICD-10-CM | POA: Diagnosis not present

## 2023-06-04 DIAGNOSIS — Z96611 Presence of right artificial shoulder joint: Secondary | ICD-10-CM | POA: Diagnosis not present

## 2023-06-04 DIAGNOSIS — M25511 Pain in right shoulder: Secondary | ICD-10-CM | POA: Diagnosis not present

## 2023-06-09 DIAGNOSIS — Z96611 Presence of right artificial shoulder joint: Secondary | ICD-10-CM | POA: Diagnosis not present

## 2023-06-09 DIAGNOSIS — M25511 Pain in right shoulder: Secondary | ICD-10-CM | POA: Diagnosis not present

## 2023-06-11 DIAGNOSIS — M797 Fibromyalgia: Secondary | ICD-10-CM | POA: Diagnosis not present

## 2023-06-11 DIAGNOSIS — Z96611 Presence of right artificial shoulder joint: Secondary | ICD-10-CM | POA: Diagnosis not present

## 2023-06-11 DIAGNOSIS — M8580 Other specified disorders of bone density and structure, unspecified site: Secondary | ICD-10-CM | POA: Diagnosis not present

## 2023-06-11 DIAGNOSIS — N1831 Chronic kidney disease, stage 3a: Secondary | ICD-10-CM | POA: Diagnosis not present

## 2023-06-11 DIAGNOSIS — R7303 Prediabetes: Secondary | ICD-10-CM | POA: Diagnosis not present

## 2023-06-11 DIAGNOSIS — F339 Major depressive disorder, recurrent, unspecified: Secondary | ICD-10-CM | POA: Diagnosis not present

## 2023-06-11 DIAGNOSIS — M25511 Pain in right shoulder: Secondary | ICD-10-CM | POA: Diagnosis not present

## 2023-06-11 DIAGNOSIS — I129 Hypertensive chronic kidney disease with stage 1 through stage 4 chronic kidney disease, or unspecified chronic kidney disease: Secondary | ICD-10-CM | POA: Diagnosis not present

## 2023-06-11 DIAGNOSIS — F419 Anxiety disorder, unspecified: Secondary | ICD-10-CM | POA: Diagnosis not present

## 2023-06-11 DIAGNOSIS — E890 Postprocedural hypothyroidism: Secondary | ICD-10-CM | POA: Diagnosis not present

## 2023-06-11 DIAGNOSIS — I471 Supraventricular tachycardia, unspecified: Secondary | ICD-10-CM | POA: Diagnosis not present

## 2023-06-23 DIAGNOSIS — Z96611 Presence of right artificial shoulder joint: Secondary | ICD-10-CM | POA: Diagnosis not present

## 2023-06-23 DIAGNOSIS — M25511 Pain in right shoulder: Secondary | ICD-10-CM | POA: Diagnosis not present

## 2023-06-30 DIAGNOSIS — M25511 Pain in right shoulder: Secondary | ICD-10-CM | POA: Diagnosis not present

## 2023-06-30 DIAGNOSIS — Z96611 Presence of right artificial shoulder joint: Secondary | ICD-10-CM | POA: Diagnosis not present

## 2023-07-02 DIAGNOSIS — M25511 Pain in right shoulder: Secondary | ICD-10-CM | POA: Diagnosis not present

## 2023-07-02 DIAGNOSIS — Z96611 Presence of right artificial shoulder joint: Secondary | ICD-10-CM | POA: Diagnosis not present

## 2023-07-07 DIAGNOSIS — Z96611 Presence of right artificial shoulder joint: Secondary | ICD-10-CM | POA: Diagnosis not present

## 2023-07-07 DIAGNOSIS — M25511 Pain in right shoulder: Secondary | ICD-10-CM | POA: Diagnosis not present

## 2023-07-14 DIAGNOSIS — M25511 Pain in right shoulder: Secondary | ICD-10-CM | POA: Diagnosis not present

## 2023-07-14 DIAGNOSIS — Z96611 Presence of right artificial shoulder joint: Secondary | ICD-10-CM | POA: Diagnosis not present

## 2023-07-16 DIAGNOSIS — Z96611 Presence of right artificial shoulder joint: Secondary | ICD-10-CM | POA: Diagnosis not present

## 2023-07-16 DIAGNOSIS — M25511 Pain in right shoulder: Secondary | ICD-10-CM | POA: Diagnosis not present

## 2023-07-20 DIAGNOSIS — M25511 Pain in right shoulder: Secondary | ICD-10-CM | POA: Diagnosis not present

## 2023-07-22 DIAGNOSIS — Z96611 Presence of right artificial shoulder joint: Secondary | ICD-10-CM | POA: Diagnosis not present

## 2023-07-22 DIAGNOSIS — M25511 Pain in right shoulder: Secondary | ICD-10-CM | POA: Diagnosis not present

## 2023-07-23 DIAGNOSIS — M25562 Pain in left knee: Secondary | ICD-10-CM | POA: Diagnosis not present

## 2023-07-29 DIAGNOSIS — M25511 Pain in right shoulder: Secondary | ICD-10-CM | POA: Diagnosis not present

## 2023-07-29 DIAGNOSIS — Z96611 Presence of right artificial shoulder joint: Secondary | ICD-10-CM | POA: Diagnosis not present

## 2023-08-05 DIAGNOSIS — M25511 Pain in right shoulder: Secondary | ICD-10-CM | POA: Diagnosis not present

## 2023-08-05 DIAGNOSIS — Z96611 Presence of right artificial shoulder joint: Secondary | ICD-10-CM | POA: Diagnosis not present

## 2023-08-08 ENCOUNTER — Other Ambulatory Visit: Payer: Self-pay | Admitting: Internal Medicine

## 2023-08-10 DIAGNOSIS — M2012 Hallux valgus (acquired), left foot: Secondary | ICD-10-CM | POA: Diagnosis not present

## 2023-08-10 DIAGNOSIS — R52 Pain, unspecified: Secondary | ICD-10-CM | POA: Diagnosis not present

## 2023-08-10 DIAGNOSIS — L84 Corns and callosities: Secondary | ICD-10-CM | POA: Diagnosis not present

## 2023-08-14 DIAGNOSIS — Z96611 Presence of right artificial shoulder joint: Secondary | ICD-10-CM | POA: Diagnosis not present

## 2023-08-14 DIAGNOSIS — M25511 Pain in right shoulder: Secondary | ICD-10-CM | POA: Diagnosis not present

## 2023-08-20 DIAGNOSIS — M2012 Hallux valgus (acquired), left foot: Secondary | ICD-10-CM | POA: Diagnosis not present

## 2023-08-21 ENCOUNTER — Other Ambulatory Visit: Payer: Self-pay | Admitting: Internal Medicine

## 2023-08-21 DIAGNOSIS — Z96611 Presence of right artificial shoulder joint: Secondary | ICD-10-CM | POA: Diagnosis not present

## 2023-08-21 DIAGNOSIS — M25511 Pain in right shoulder: Secondary | ICD-10-CM | POA: Diagnosis not present

## 2023-08-24 ENCOUNTER — Telehealth: Payer: Self-pay | Admitting: Internal Medicine

## 2023-08-24 NOTE — Telephone Encounter (Signed)
 Fax received for "Request Evaluation for Perioperative Device Management.," for upcoming hand surgery scheduled for 08/26/23.  Form completed and faxed to [facility]- attn: Simona Dublin [phone].  Fax confirmation received.   Staff message sent to EP scheduling to please contact the patient to schedule follow up with an EP APP post op to recheck her device.  She does not have remote capability for her device- last seen in office 10/20/22.

## 2023-08-26 DIAGNOSIS — K219 Gastro-esophageal reflux disease without esophagitis: Secondary | ICD-10-CM | POA: Diagnosis not present

## 2023-08-26 DIAGNOSIS — M205X2 Other deformities of toe(s) (acquired), left foot: Secondary | ICD-10-CM | POA: Diagnosis not present

## 2023-08-26 DIAGNOSIS — Z87891 Personal history of nicotine dependence: Secondary | ICD-10-CM | POA: Diagnosis not present

## 2023-08-26 DIAGNOSIS — I10 Essential (primary) hypertension: Secondary | ICD-10-CM | POA: Diagnosis not present

## 2023-08-26 DIAGNOSIS — M25775 Osteophyte, left foot: Secondary | ICD-10-CM | POA: Diagnosis not present

## 2023-08-26 DIAGNOSIS — L84 Corns and callosities: Secondary | ICD-10-CM | POA: Diagnosis not present

## 2023-08-26 DIAGNOSIS — E0590 Thyrotoxicosis, unspecified without thyrotoxic crisis or storm: Secondary | ICD-10-CM | POA: Diagnosis not present

## 2023-08-26 DIAGNOSIS — M2062 Acquired deformities of toe(s), unspecified, left foot: Secondary | ICD-10-CM | POA: Diagnosis not present

## 2023-08-26 DIAGNOSIS — M2012 Hallux valgus (acquired), left foot: Secondary | ICD-10-CM | POA: Diagnosis not present

## 2023-08-26 DIAGNOSIS — Z9889 Other specified postprocedural states: Secondary | ICD-10-CM | POA: Diagnosis not present

## 2023-08-26 DIAGNOSIS — Z950 Presence of cardiac pacemaker: Secondary | ICD-10-CM | POA: Diagnosis not present

## 2023-09-02 DIAGNOSIS — M25562 Pain in left knee: Secondary | ICD-10-CM | POA: Diagnosis not present

## 2023-09-13 NOTE — Progress Notes (Unsigned)
  Cardiology Office Note:  .   Date:  09/13/2023  ID:  Cindy [name], DOB 20-Dec-1945, MRN [id number] PCP: Azalia Leo, MD  [facility] Providers Cardiologist:  None Electrophysiologist:  Richardo Chandler, MD {  History of Present Illness: .   Cindy [name] is a 78 y.o. female w/PMHx of  HTN, anxiety ATach Symptomatic bradycardia > PPM  She saw Jonelle Neri 10/20/22, doing well, rare AT episodes, no changes were made.  Pre-op clinic provided device management recs 08/24/23, for hand surgery  Today's visit is scheduled as a post procedure device check ROS:   She is doing well Ambulating with an orthopedic boot  No syncope since her pacer implant No cardiac concerns, complaints Sees her PMD regularly  Device information Biotronik dual chamber PPM implanted 09/11/2010  Studies Reviewed: Aaron Aas    EKG done today and reviewed by myself AP/VS 57bpm  DEVICE interrogation done today and reviewed by myself Battery and lead measurements are good 2 fleeting AFlutter episodes 8 and 20 seconds    04/02/2022: Coronary CT FINDINGS: Coronary arteries: Normal origins. Coronary Calcium Score: Left main: 0 Left anterior descending artery: 38.7 Left circumflex artery: 0.885 Right coronary artery: 0 Total: 39.6 Percentile: 44 Pericardium: Normal. Ascending Aorta: Normal caliber. Non-cardiac: See separate report from [facility]. Status post pacemaker implantation.   IMPRESSION: Coronary calcium score of 39.6. This was 69 percentile for age-, race-, and sex-matched controls.   03/28/2010: TTE Study Conclusions   - Left ventricle: The cavity size was normal. Wall thickness was     normal. Systolic function was normal. The estimated ejection     fraction was in the range of 60% to 65%. Wall motion was normal;     there were no regional wall motion abnormalities. Doppler     parameters are consistent with abnormal left ventricular     relaxation (grade 1 diastolic  dysfunction).   - Left atrium: The atrium was mildly to moderately dilated.    Risk Assessment/Calculations:    Physical Exam:   VS:  There were no vitals taken for this visit.   Wt Readings from Last 3 Encounters:  04/30/23 157 lb (71.2 kg)  04/21/23 156 lb (70.8 kg)  10/20/22 152 lb (68.9 kg)    GEN: Well nourished, well developed in no acute distress NECK: No JVD; No carotid bruits CARDIAC: RRR, no murmurs, rubs, gallops RESPIRATORY:  CTA b/l without rales, wheezing or rhonchi  ABDOMEN: Soft, non-tender, non-distended EXTREMITIES:  No edema; No deformity   PPM site: is stable, no thinning, fluctuation, tethering  ASSESSMENT AND PLAN: .    PPM intact function no programming changes made Unable to do remotes She has a mixed system with MDT leads > plan for MDT can at time of gen change, getting MRIs have proven challenging (orthopedic needs)   ATach Rare and fleeting ATach vs AFlutter  HTN Looks good    Dispo: back in 6 mo, sooner if needed, looks like she is slated to establish w/Dr. Daneil Dunker, she is aware  Signed, Debbie Fails, PA-C

## 2023-09-16 ENCOUNTER — Ambulatory Visit: Attending: Cardiology | Admitting: Physician Assistant

## 2023-09-16 VITALS — BP 132/62 | HR 57 | Ht 62.0 in | Wt 160.0 lb

## 2023-09-16 DIAGNOSIS — Z950 Presence of cardiac pacemaker: Secondary | ICD-10-CM | POA: Diagnosis not present

## 2023-09-16 DIAGNOSIS — I4719 Other supraventricular tachycardia: Secondary | ICD-10-CM

## 2023-09-16 DIAGNOSIS — I10 Essential (primary) hypertension: Secondary | ICD-10-CM | POA: Diagnosis not present

## 2023-09-16 LAB — CUP PACEART INCLINIC DEVICE CHECK
Date Time Interrogation Session: 20250611180210
Implantable Lead Connection Status: 753985
Implantable Lead Connection Status: 753985
Implantable Lead Implant Date: 20120606
Implantable Lead Implant Date: 20120606
Implantable Lead Location: 753859
Implantable Lead Location: 753860
Implantable Lead Model: 5076
Implantable Lead Model: 5076
Implantable Pulse Generator Implant Date: 20120606
Lead Channel Pacing Threshold Amplitude: 1.2 V
Lead Channel Pacing Threshold Pulse Width: 0.4 ms
Lead Channel Sensing Intrinsic Amplitude: 13.9 mV
Pulse Gen Serial Number: 66089338

## 2023-09-16 NOTE — Patient Instructions (Addendum)
 Medication Instructions:   Your physician recommends that you continue on your current medications as directed. Please refer to the Current Medication list given to you today.   *If you need a refill on your cardiac medications before your next appointment, please call your pharmacy*  Lab Work: NONE ORDERED  TODAY     If you have labs (blood work) drawn today and your tests are completely normal, you will receive your results only by: MyChart Message (if you have MyChart) OR A paper copy in the mail If you have any lab test that is abnormal or we need to change your treatment, we will call you to review the results.   Testing/Procedures: NONE ORDERED  TODAY     Follow-Up: At [facility], you and your health needs are our priority.  As part of our continuing mission to provide you with exceptional heart care, our providers are all part of one team.  This team includes your primary Cardiologist (physician) and Advanced Practice Providers or APPs (Physician Assistants and Nurse Practitioners) who all work together to provide you with the care you need, when you need it.  Your next appointment:   6 month(s)  Provider:   You may see Dr. Daneil Dunker  or one of the following Advanced Practice Providers on your designated Care Team:     We recommend signing up for the patient portal called MyChart.  Sign up information is provided on this After Visit Summary.  MyChart is used to connect with patients for Virtual Visits (Telemedicine).  Patients are able to view lab/test results, encounter notes, upcoming appointments, etc.  Non-urgent messages can be sent to your provider as well.   To learn more about what you can do with MyChart, go to [email or web address].   Other Instructions

## 2023-09-23 ENCOUNTER — Ambulatory Visit

## 2023-09-23 DIAGNOSIS — Z780 Asymptomatic menopausal state: Secondary | ICD-10-CM

## 2023-09-23 DIAGNOSIS — M8580 Other specified disorders of bone density and structure, unspecified site: Secondary | ICD-10-CM

## 2023-09-23 DIAGNOSIS — M85852 Other specified disorders of bone density and structure, left thigh: Secondary | ICD-10-CM | POA: Diagnosis not present

## 2023-10-01 DIAGNOSIS — K5730 Diverticulosis of large intestine without perforation or abscess without bleeding: Secondary | ICD-10-CM | POA: Diagnosis not present

## 2023-10-01 DIAGNOSIS — K219 Gastro-esophageal reflux disease without esophagitis: Secondary | ICD-10-CM | POA: Diagnosis not present

## 2023-10-01 DIAGNOSIS — K8020 Calculus of gallbladder without cholecystitis without obstruction: Secondary | ICD-10-CM | POA: Diagnosis not present

## 2023-10-06 ENCOUNTER — Encounter: Admitting: Cardiology

## 2023-10-15 DIAGNOSIS — M8580 Other specified disorders of bone density and structure, unspecified site: Secondary | ICD-10-CM | POA: Diagnosis not present

## 2023-10-15 DIAGNOSIS — M85852 Other specified disorders of bone density and structure, left thigh: Secondary | ICD-10-CM | POA: Diagnosis not present

## 2023-10-15 DIAGNOSIS — M85851 Other specified disorders of bone density and structure, right thigh: Secondary | ICD-10-CM | POA: Diagnosis not present

## 2023-10-15 DIAGNOSIS — Z09 Encounter for follow-up examination after completed treatment for conditions other than malignant neoplasm: Secondary | ICD-10-CM | POA: Diagnosis not present

## 2023-10-23 DIAGNOSIS — Z1231 Encounter for screening mammogram for malignant neoplasm of breast: Secondary | ICD-10-CM | POA: Diagnosis not present

## 2023-10-29 ENCOUNTER — Other Ambulatory Visit: Payer: Self-pay | Admitting: Surgery

## 2023-10-29 DIAGNOSIS — K8020 Calculus of gallbladder without cholecystitis without obstruction: Secondary | ICD-10-CM

## 2023-10-29 DIAGNOSIS — K449 Diaphragmatic hernia without obstruction or gangrene: Secondary | ICD-10-CM | POA: Diagnosis not present

## 2023-11-03 ENCOUNTER — Ambulatory Visit
Admission: RE | Admit: 2023-11-03 | Discharge: 2023-11-03 | Disposition: A | Discharge status: Home / Self Care | Source: Ambulatory Visit | Attending: Surgery

## 2023-11-03 DIAGNOSIS — K8020 Calculus of gallbladder without cholecystitis without obstruction: Secondary | ICD-10-CM

## 2023-11-03 DIAGNOSIS — K824 Cholesterolosis of gallbladder: Secondary | ICD-10-CM | POA: Diagnosis not present

## 2023-11-11 ENCOUNTER — Telehealth: Payer: Self-pay

## 2023-11-11 NOTE — Telephone Encounter (Signed)
..     Pre-operative Risk Assessment    Patient Name: Cindy [name]  DOB: [birth year] MRN: [id number]   Date of last office visit: 6.11.25 Date of next office visit: NONE   Request for Surgical Clearance    Procedure:  LAPAROSCOPIC CHOLECYSTECTOMY  Date of Surgery:  Clearance TBD                                Surgeon:  DR DREAMA HANGER Surgeon's Group or Practice Name:  [facility] Phone number:  [phone] Fax number:  [phone]   Type of Clearance Requested:   - Medical    Type of Anesthesia:  General    Additional requests/questions:    Bonney Teressa Rumalda Ronal   11/11/2023, 3:22 PM

## 2023-11-11 NOTE — Telephone Encounter (Signed)
 S/W pt and scheduled TELE Preop appt 11/17/23. Med Rec and Consent done

## 2023-11-11 NOTE — Telephone Encounter (Signed)
   Name: Cindy [name]  DOB: 1945/08/23  MRN: [id number]  Primary Cardiologist: None   Preoperative team, please contact this patient and set up a phone call appointment for further preoperative risk assessment. Please obtain consent and complete medication review. Thank you for your help.  I confirm that guidance regarding antiplatelet and oral anticoagulation therapy has been completed and, if necessary, noted below.  Patient is not on anticoagulation or antiplatelet per review of medical record in Epic.    I also confirmed the patient resides in the state of [place] . As per [facility] Medical Board telemedicine laws, the patient must reside in the state in which the provider is licensed.   Lamarr Satterfield, NP 11/11/2023, 3:34 PM [facility]

## 2023-11-11 NOTE — Telephone Encounter (Signed)
 Med Rec and Consent done    Patient Consent for Virtual Visit        Cindy [name] has provided verbal consent on 11/11/2023 for a virtual visit (video or telephone).   CONSENT FOR VIRTUAL VISIT FOR:  Cindy [name]  By participating in this virtual visit I agree to the following:  I hereby voluntarily request, consent and authorize [facility] and its employed or contracted physicians, physician assistants, nurse practitioners or other licensed health care professionals (the Practitioner), to provide me with telemedicine health care services (the "Services) as deemed necessary by the treating Practitioner. I acknowledge and consent to receive the Services by the Practitioner via telemedicine. I understand that the telemedicine visit will involve communicating with the Practitioner through live audiovisual communication technology and the disclosure of certain medical information by electronic transmission. I acknowledge that I have been given the opportunity to request an in-person assessment or other available alternative prior to the telemedicine visit and am voluntarily participating in the telemedicine visit.  I understand that I have the right to withhold or withdraw my consent to the use of telemedicine in the course of my care at any time, without affecting my right to future care or treatment, and that the Practitioner or I may terminate the telemedicine visit at any time. I understand that I have the right to inspect all information obtained and/or recorded in the course of the telemedicine visit and may receive copies of available information for a reasonable fee.  I understand that some of the potential risks of receiving the Services via telemedicine include:  Delay or interruption in medical evaluation due to technological equipment failure or disruption; Information transmitted may not be sufficient (e.g. poor resolution of images) to allow for appropriate medical  decision making by the Practitioner; and/or  In rare instances, security protocols could fail, causing a breach of personal health information.  Furthermore, I acknowledge that it is my responsibility to provide information about my medical history, conditions and care that is complete and accurate to the best of my ability. I acknowledge that Practitioner's advice, recommendations, and/or decision may be based on factors not within their control, such as incomplete or inaccurate data provided by me or distortions of diagnostic images or specimens that may result from electronic transmissions. I understand that the practice of medicine is not an exact science and that Practitioner makes no warranties or guarantees regarding treatment outcomes. I acknowledge that a copy of this consent can be made available to me via my patient portal [facility] MyChart), or I can request a printed copy by calling the office of  [facility].    I understand that my insurance will be billed for this visit.   I have read or had this consent read to me. I understand the contents of this consent, which adequately explains the benefits and risks of the Services being provided via telemedicine.  I have been provided ample opportunity to ask questions regarding this consent and the Services and have had my questions answered to my satisfaction. I give my informed consent for the services to be provided through the use of telemedicine in my medical care

## 2023-11-13 ENCOUNTER — Other Ambulatory Visit: Payer: Self-pay | Admitting: Surgery

## 2023-11-13 ENCOUNTER — Ambulatory Visit
Admission: RE | Admit: 2023-11-13 | Discharge: 2023-11-13 | Disposition: A | Discharge status: Home / Self Care | Source: Ambulatory Visit | Attending: Surgery | Admitting: Surgery

## 2023-11-13 DIAGNOSIS — R059 Cough, unspecified: Secondary | ICD-10-CM | POA: Diagnosis not present

## 2023-11-13 DIAGNOSIS — K449 Diaphragmatic hernia without obstruction or gangrene: Secondary | ICD-10-CM

## 2023-11-13 DIAGNOSIS — R0602 Shortness of breath: Secondary | ICD-10-CM | POA: Diagnosis not present

## 2023-11-17 ENCOUNTER — Ambulatory Visit: Attending: Cardiology

## 2023-11-17 DIAGNOSIS — Z01810 Encounter for preprocedural cardiovascular examination: Secondary | ICD-10-CM | POA: Diagnosis not present

## 2023-11-17 NOTE — Progress Notes (Signed)
 Virtual Visit via Telephone Note   Because of Cindy [name] co-morbid illnesses, she is at least at moderate risk for complications without adequate follow up.  This format is felt to be most appropriate for this patient at this time.  Due to technical limitations with video connection Web designer), today's appointment will be conducted as an audio only telehealth visit, and Cindy [name] verbally agreed to proceed in this manner.   All issues noted in this document were discussed and addressed.  No physical exam could be performed with this format.  Evaluation Performed:  Preoperative cardiovascular risk assessment _____________   Date:  11/17/2023   Patient ID:  Cindy [name], DOB May 18, 1945, MRN [id number] Patient Location:  Home Provider location:   Office  Primary Care Provider:  Stephane Leita DEL, MD Primary Cardiologist:  None  Chief Complaint / Patient Profile  78 y.o. y/o female with a h/o hypertension, PVCs, atrial tachycardia, symptomatic bradycardia s/p PPM who is pending laparoscopic cholecystectomy with Dr. Dreama Hanger and presents today for telephonic preoperative cardiovascular risk assessment. History of Present Illness  Cindy [name] is a 78 y.o. female who presents via audio/video conferencing for a telehealth visit today.  Pt was last seen in cardiology clinic on 09/16/23 by Cindy Arthur, PA.  At that time Cindy [name] was doing well.  The patient is now pending procedure as outlined above. Since her last visit, she has remained stable from a cardiac standpoint. Today she denies chest pain, shortness of breath, fatigue, palpitations, melena, hematuria, hemoptysis, diaphoresis, weakness, presyncope, syncope, orthopnea, and PND.  She reports that she does have some minor left ankle swelling related to her recent procedure.  She is able to achieve greater than 4 METs of activity with walking, climbing of stairs and household chores.  Past Medical  History    Past Medical History:  Diagnosis Date   Abnormal nuclear stress test 12/15/2013   Anemia    hx iron def, no GI loss indentifed - resolved with diet change     Anxiety    Arthritis    Degenerative disc disease, cervical    Depression    Dysrhythmia    GERD (gastroesophageal reflux disease)    GRAVES' DISEASE 2013   I-131 ablation, now post tx hypothyroid state   History of hiatal hernia    HYPERTENSION    Impaired glucose tolerance test    Keloid 12/31/2010   incisional    Pacemaker -Biotronik-CLS 08/20/2010   Fainted easily   Postablative hypothyroidism    Qualifier: Diagnosis of  By: Cindy [name], Cindy [name]     Pre-diabetes    Sciatica    Syncope 08/20/2010   s/p PPM 08/2010 for sxc pause on loop recorder causing same   Unspecified vitamin D deficiency    Past Surgical History:  Procedure Laterality Date   APPENDECTOMY     blephroplasty     BREAST BIOPSY Left 2001   guided excisional bx (L) breast microcalcification at 12 o'clock- Benign   BUNIONECTOMY Left    CATARACT EXTRACTION  2014   FRACTURE SURGERY     ORIF 06-2010   HIATAL HERNIA REPAIR N/A 04/10/2020   Procedure: LAPAROSCOPIC REPAIR OF TYPE 3 HIATAL HERNIA WITH NISSEN FUNDOPLICATION;  Surgeon: Cindy Cough, MD;  Location: [facility];  Service: General;  Laterality: N/A;   KNEE ARTHROSCOPY Left 10/28/2021   Procedure: ARTHROSCOPY KNEE;  Surgeon: Cindy Rush, MD;  Location: [facility];  Service: Orthopedics;  Laterality: Left;  LEFT HEART CATHETERIZATION WITH CORONARY ANGIOGRAM N/A 12/22/2013   Procedure: LEFT HEART CATHETERIZATION WITH CORONARY ANGIOGRAM;  Surgeon: Cindy LELON Claudene DOUGLAS, MD;  Location: [facility] CATH LAB;  Service: Cardiovascular;  Laterality: N/A;   PACEMAKER INSERTION     REVERSE SHOULDER ARTHROPLASTY Right 04/30/2023   Procedure: REVERSE SHOULDER ARTHROPLASTY;  Surgeon: Cindy Soulier, MD;  Location: [facility];  Service: Orthopedics;  Laterality: Right;   Right ankle  06/2010   Trimalleolar fracture    TOTAL KNEE ARTHROPLASTY Left 03/17/2022   Procedure: TOTAL KNEE ARTHROPLASTY;  Surgeon: Cindy Rush, MD;  Location: [facility];  Service: Orthopedics;  Laterality: Left;   TOTAL SHOULDER ARTHROPLASTY Left 01/27/2019   Procedure: TOTAL SHOULDER ARTHROPLASTY;  Surgeon: Cindy Soulier, MD;  Location: [facility];  Service: Orthopedics;  Laterality: Left;   Allergies Allergies  Allergen Reactions   Ace Inhibitors Swelling   Lisinopril  Swelling    angiodema   Codeine Nausea And Vomiting   Home Medications    Prior to Admission medications   Medication Sig Start Date End Date Taking? Authorizing Provider  acidophilus (RISAQUAD) CAPS capsule Take 1 capsule by mouth daily.    [provider]  ALPRAZolam  (XANAX ) 0.5 MG tablet Take 0.25 mg by mouth daily as needed for anxiety or sleep. 05/19/17   [provider]  amLODipine  (NORVASC ) 5 MG tablet TAKE 1 TABLET BY MOUTH EVERY DAY 08/21/23   Cindy Elspeth BROCKS, MD  buPROPion  (WELLBUTRIN  XL) 300 MG 24 hr tablet Take 150 mg by mouth every morning. 03/18/21   [provider]  levothyroxine  (SYNTHROID ) 112 MCG tablet Take 112 mcg by mouth daily before breakfast. 06/09/19   [provider]  Multiple Vitamins-Minerals (MULTI-VITAMIN GUMMIES PO) Take 2 tablets by mouth daily.    [provider]  nebivolol  (BYSTOLIC ) 5 MG tablet Take 5 mg by mouth daily.    [provider]  NON FORMULARY Take 1 Piece by mouth daily as needed (pain). CBD Gummy    [provider]  oxyCODONE  (ROXICODONE ) 5 MG immediate release tablet Take 1 tablet (5 mg total) by mouth every 4 (four) hours as needed for severe pain (pain score 7-10). 04/30/23   Cindy [name], [facility], PA-C  PARoxetine  (PAXIL ) 10 MG tablet Take 10 mg by mouth daily.    [provider]  pregabalin (LYRICA) 75 MG capsule Take 75 mg by mouth daily.    [provider]  spironolactone  (ALDACTONE ) 50 MG tablet Take 50 mg by mouth daily.    [provider]  tiZANidine  (ZANAFLEX ) 2 MG tablet Take 1 tablet (2 mg total) by mouth every 8 (eight) hours as needed for muscle spasms. 03/17/22   Cindy Agent, PA-C    Physical Exam  Vital Signs:  LYNNMARIE LOVETT does not have vital signs available for review today. Given telephonic nature of communication, physical exam is limited. AAOx3. NAD. Normal affect.  Speech and respirations are unlabored. Accessory Clinical Findings  None Assessment & Plan    1.  Preoperative Cardiovascular Risk Assessment: Ms. Erck perioperative risk of a major cardiac event is 0.9% according to the Revised Cardiac Risk Index (RCRI).  Therefore, she is at low risk for perioperative complications.   Her functional capacity is good at 6.05 METs according to the Duke Activity Status Index (DASI). Recommendations: According to ACC/AHA guidelines, no further cardiovascular testing needed.  The patient may proceed to surgery at acceptable risk.     The patient was advised that if she develops new symptoms prior  to surgery to contact our office to arrange for a follow-up visit, and she verbalized understanding.  A copy of this note will be routed to requesting surgeon.  Time:   Today, I have spent 12 minutes with the patient with telehealth technology discussing medical history, symptoms, and management plan.    Correll Denbow D Nichoel Digiulio, NP  11/17/2023, 10:46 AM

## 2023-11-17 NOTE — Progress Notes (Signed)
 Virtual Visit via Telephone Note   Because of [name] co-morbid illnesses, she is at least at moderate risk for complications without adequate follow up.  This format is felt to be most appropriate for this patient at this time.  Due to technical limitations with video connection Web designer), today's appointment will be conducted as an audio only telehealth visit, and Cindy [name] verbally agreed to proceed in this manner.   All issues noted in this document were discussed and addressed.  No physical exam could be performed with this format.  Evaluation Performed:  Preoperative cardiovascular risk assessment _____________   Date:  11/17/2023   Patient ID:  Cindy [name], DOB May 18, 1945, MRN [id number] Patient Location:  Home Provider location:   Office  Primary Care Provider:  Stephane Leita DEL, MD Primary Cardiologist:  None  Chief Complaint / Patient Profile  78 y.o. y/o female with a h/o hypertension, PVCs, atrial tachycardia, symptomatic bradycardia s/p PPM who is pending laparoscopic cholecystectomy with Dr. Dreama Hanger and presents today for telephonic preoperative cardiovascular risk assessment. History of Present Illness  Cindy [name] is a 78 y.o. female who presents via audio/video conferencing for a telehealth visit today.  Pt was last seen in cardiology clinic on 09/16/23 by Cindy Arthur, PA.  At that time Cindy [name] was doing well.  The patient is now pending procedure as outlined above. Since her last visit, she has remained stable from a cardiac standpoint. Today she denies chest pain, shortness of breath, fatigue, palpitations, melena, hematuria, hemoptysis, diaphoresis, weakness, presyncope, syncope, orthopnea, and PND.  She reports that she does have some minor left ankle swelling related to her recent procedure.  She is able to achieve greater than 4 METs of activity with walking, climbing of stairs and household chores.  Past Medical  History    Past Medical History:  Diagnosis Date   Abnormal nuclear stress test 12/15/2013   Anemia    hx iron def, no GI loss indentifed - resolved with diet change     Anxiety    Arthritis    Degenerative disc disease, cervical    Depression    Dysrhythmia    GERD (gastroesophageal reflux disease)    GRAVES' DISEASE 2013   I-131 ablation, now post tx hypothyroid state   History of hiatal hernia    HYPERTENSION    Impaired glucose tolerance test    Keloid 12/31/2010   incisional    Pacemaker -Biotronik-CLS 08/20/2010   Fainted easily   Postablative hypothyroidism    Qualifier: Diagnosis of  By: Brien CMA, Nova     Pre-diabetes    Sciatica    Syncope 08/20/2010   s/p PPM 08/2010 for sxc pause on loop recorder causing same   Unspecified vitamin D deficiency    Past Surgical History:  Procedure Laterality Date   APPENDECTOMY     blephroplasty     BREAST BIOPSY Left 2001   guided excisional bx (L) breast microcalcification at 12 o'clock- Benign   BUNIONECTOMY Left    CATARACT EXTRACTION  2014   FRACTURE SURGERY     ORIF 06-2010   HIATAL HERNIA REPAIR N/A 04/10/2020   Procedure: LAPAROSCOPIC REPAIR OF TYPE 3 HIATAL HERNIA WITH NISSEN FUNDOPLICATION;  Surgeon: Gladis Cough, MD;  Location: [facility];  Service: General;  Laterality: N/A;   KNEE ARTHROSCOPY Left 10/28/2021   Procedure: ARTHROSCOPY KNEE;  Surgeon: Yvone Rush, MD;  Location: [facility];  Service: Orthopedics;  Laterality: Left;  LEFT HEART CATHETERIZATION WITH CORONARY ANGIOGRAM N/A 12/22/2013   Procedure: LEFT HEART CATHETERIZATION WITH CORONARY ANGIOGRAM;  Surgeon: Victory LELON Claudene DOUGLAS, MD;  Location: [facility] CATH LAB;  Service: Cardiovascular;  Laterality: N/A;   PACEMAKER INSERTION     REVERSE SHOULDER ARTHROPLASTY Right 04/30/2023   Procedure: REVERSE SHOULDER ARTHROPLASTY;  Surgeon: Dozier Soulier, MD;  Location: [facility];  Service: Orthopedics;  Laterality: Right;   Right ankle  06/2010   Trimalleolar fracture    TOTAL KNEE ARTHROPLASTY Left 03/17/2022   Procedure: TOTAL KNEE ARTHROPLASTY;  Surgeon: Yvone Rush, MD;  Location: [facility];  Service: Orthopedics;  Laterality: Left;   TOTAL SHOULDER ARTHROPLASTY Left 01/27/2019   Procedure: TOTAL SHOULDER ARTHROPLASTY;  Surgeon: Dozier Soulier, MD;  Location: [facility];  Service: Orthopedics;  Laterality: Left;   Allergies Allergies  Allergen Reactions   Ace Inhibitors Swelling   Lisinopril  Swelling    angiodema   Codeine Nausea And Vomiting   Home Medications    Prior to Admission medications   Medication Sig Start Date End Date Taking? Authorizing Provider  acidophilus (RISAQUAD) CAPS capsule Take 1 capsule by mouth daily.    [provider]  ALPRAZolam  (XANAX ) 0.5 MG tablet Take 0.25 mg by mouth daily as needed for anxiety or sleep. 05/19/17   [provider]  amLODipine  (NORVASC ) 5 MG tablet TAKE 1 TABLET BY MOUTH EVERY DAY 08/21/23   Fernande Elspeth BROCKS, MD  buPROPion  (WELLBUTRIN  XL) 300 MG 24 hr tablet Take 150 mg by mouth every morning. 03/18/21   [provider]  levothyroxine  (SYNTHROID ) 112 MCG tablet Take 112 mcg by mouth daily before breakfast. 06/09/19   [provider]  Multiple Vitamins-Minerals (MULTI-VITAMIN GUMMIES PO) Take 2 tablets by mouth daily.    [provider]  nebivolol  (BYSTOLIC ) 5 MG tablet Take 5 mg by mouth daily.    [provider]  NON FORMULARY Take 1 Piece by mouth daily as needed (pain). CBD Gummy    [provider]  oxyCODONE  (ROXICODONE ) 5 MG immediate release tablet Take 1 tablet (5 mg total) by mouth every 4 (four) hours as needed for severe pain (pain score 7-10). 04/30/23   [place], [facility], PA-C  PARoxetine  (PAXIL ) 10 MG tablet Take 10 mg by mouth daily.    [provider]  pregabalin (LYRICA) 75 MG capsule Take 75 mg by mouth daily.    [provider]  spironolactone  (ALDACTONE ) 50 MG tablet Take 50 mg by mouth daily.    [provider]  tiZANidine  (ZANAFLEX ) 2 MG tablet Take 1 tablet (2 mg total) by mouth every 8 (eight) hours as needed for muscle spasms. 03/17/22   Rondall Agent, PA-C    Physical Exam  Vital Signs:  Cindy [name] does not have vital signs available for review today. Given telephonic nature of communication, physical exam is limited. AAOx3. NAD. Normal affect.  Speech and respirations are unlabored. Accessory Clinical Findings  None Assessment & Plan    1.  Preoperative Cardiovascular Risk Assessment: Cindy [name] perioperative risk of a major cardiac event is 0.9% according to the Revised Cardiac Risk Index (RCRI).  Therefore, she is at low risk for perioperative complications.   Her functional capacity is good at 6.05 METs according to the Duke Activity Status Index (DASI). Recommendations: According to ACC/AHA guidelines, no further cardiovascular testing needed.  The patient may proceed to surgery at acceptable risk.     The patient was advised that if she develops new symptoms prior  to surgery to contact our office to arrange for a follow-up visit, and she verbalized understanding.  A copy of this note will be routed to requesting surgeon.  Time:   Today, I have spent 12 minutes with the patient with telehealth technology discussing medical history, symptoms, and management plan.    Correll Denbow D Nichoel Digiulio, NP  11/17/2023, 10:46 AM

## 2023-11-26 DIAGNOSIS — Z09 Encounter for follow-up examination after completed treatment for conditions other than malignant neoplasm: Secondary | ICD-10-CM | POA: Diagnosis not present

## 2023-11-26 DIAGNOSIS — L84 Corns and callosities: Secondary | ICD-10-CM | POA: Diagnosis not present

## 2023-11-26 NOTE — Telephone Encounter (Signed)
 Requesting office sent a duplicate request. I have reviewed the chart which does reflect the pt had appt Katlyn West, NP 11/17/23. Ov notes reflect the pt has been cleared.   I will however have the preop APP review. If confirmed pt has been cleared we will re-fax notes to surgeon office:  Dr. Dreama Hanger; see clearance request for complete info.

## 2023-12-01 ENCOUNTER — Encounter: Payer: Self-pay | Admitting: Cardiology

## 2023-12-01 NOTE — Progress Notes (Signed)
 Please place orders for PST appointment scheduled 12/02/23.

## 2023-12-01 NOTE — Progress Notes (Signed)
 Patient has multiple CHG soaps at home. Did not send her home with new one  COVID Vaccine Completed: yes  Date of COVID positive in last 90 days:  PCP - Leita Blind, MD Cardiologist -  Electrophysiologist- Fonda Kitty, MD  Cardiac clearance by Rosabel Mose, NP 11/17/23 in Epic   Chest x-ray - 11/13/23 Epic EKG - 09/16/23 Epic Stress Test - 11/15/13 Epic ECHO - 2011 Cardiac Cath - 2015 Pacemaker/ICD device last checked: 09/16/23 Epic- device orders requested via Epic- per order procedure may interfere with device function. Called [employer] and spoke with Cher. She put in consult for local rep. Received call from device rep, Delon, she stated to put magnet over device. Spinal Cord Stimulator:N/A  Bowel Prep - N/A  Sleep Study - N/A CPAP -   Fasting Blood Sugar - preDM Checks Blood Sugar _____ times a day  Last dose of GLP1 agonist-  N/A GLP1 instructions:  Do not take after     Last dose of SGLT-2 inhibitors-  N/A SGLT-2 instructions:  Do not take after     Blood Thinner Instructions: N/A Last dose:  N/a Time: Aspirin  Instructions:N/A Last Dose:  Activity level: Can go up a flight of stairs and perform activities of daily living without stopping and without symptoms of chest pain or shortness of breath.   Anesthesia review: HTN, PVCs, atrial tachycardia,  pacemaker, anemia, syncope  Patient denies shortness of breath, fever, cough and chest pain at PAT appointment  Patient verbalized understanding of instructions that were given to them at the PAT appointment. Patient was also instructed that they will need to review over the PAT instructions again at home before surgery.

## 2023-12-01 NOTE — Progress Notes (Signed)
 error

## 2023-12-01 NOTE — Progress Notes (Signed)
 PERIOPERATIVE PRESCRIPTION FOR IMPLANTED CARDIAC DEVICE PROGRAMMING  Patient Information: Name:  Cindy [name]  DOB:  [birth year]  MRN:  [id number]    Planned Procedure:  laparoscopic cholecystectomy  Surgeon:  Dr. Paola  Date of Procedure:  12/08/23  Cautery will be used.  Position during surgery:  unknown   Device Information:  Clinic EP Physician:  Fonda Kitty, MD  Device Type:  Pacemaker Manufacturer and Phone #:  [employer]: [phone] Pacemaker Dependent?:  Yes.   Date of Last Device Check:  09/16/23 Normal Device Function?:  Yes.    Electrophysiologist's Recommendations:  Have magnet available. Provide continuous ECG monitoring when magnet is used or reprogramming is to be performed.  Procedure may interfere with device function.  Magnet should be placed over device during procedure.  Per Device Clinic Standing Orders, [name], [place]  12:16 PM 12/01/2023

## 2023-12-01 NOTE — Patient Instructions (Signed)
SURGICAL WAITING ROOM VISITATION  Patients having surgery or a procedure may have no more than 2 support people in the waiting area - these visitors may rotate.    Children under the age of 64 must have an adult with them who is not the patient.  Visitors with respiratory illnesses are discouraged from visiting and should remain at home.  If the patient needs to stay at the hospital during part of their recovery, the visitor guidelines for inpatient rooms apply. Pre-op nurse will coordinate an appropriate time for 1 support person to accompany patient in pre-op.  This support person may not rotate.    Please refer to the [facility] website for the visitor guidelines for Inpatients (after your surgery is over and you are in a regular room).    Your procedure is scheduled on: 12/08/23   Report to [facility] Main Entrance    Report to admitting at 9:00 AM   Call this number if you have problems the morning of surgery [phone]   Do not eat food or drink liquids :After Midnight.         If you have questions, please contact your surgeon's office.   FOLLOW BOWEL PREP AND ANY ADDITIONAL PRE OP INSTRUCTIONS YOU RECEIVED FROM YOUR SURGEON'S OFFICE!!!     Oral Hygiene is also important to reduce your risk of infection.                                    Remember - BRUSH YOUR TEETH THE MORNING OF SURGERY WITH YOUR REGULAR TOOTHPASTE  DENTURES WILL BE REMOVED PRIOR TO SURGERY PLEASE DO NOT APPLY Poly grip OR ADHESIVES!!!    Stop all vitamins and herbal supplements 7 days before surgery.   Take these medicines the morning of surgery with A SIP OF WATER : Alprazolam , Bupropion , Synthroid , Nebivolol , Paroxetine , Lyrica, Tylenol              You may not have any metal on your body including hair pins, jewelry, and body piercing             Do not wear make-up, lotions, powders, perfumes, or deodorant  Do not wear nail polish including gel and S&S, artificial/acrylic nails, or  any other type of covering on natural nails including finger and toenails. If you have artificial nails, gel coating, etc. that needs to be removed by a nail salon please have this removed prior to surgery or surgery may need to be canceled/ delayed if the surgeon/ anesthesia feels like they are unable to be safely monitored.   Do not shave  48 hours prior to surgery.    Do not bring valuables to the hospital. [facility] IS NOT             RESPONSIBLE   FOR VALUABLES.   Contacts, glasses, dentures or bridgework may not be worn into surgery.  DO NOT BRING YOUR HOME MEDICATIONS TO THE HOSPITAL. PHARMACY WILL DISPENSE MEDICATIONS LISTED ON YOUR MEDICATION LIST TO YOU DURING YOUR ADMISSION IN THE HOSPITAL!    Patients discharged on the day of surgery will not be allowed to drive home.  Someone NEEDS to stay with you for the first 24 hours after anesthesia.              Please read over the following fact sheets you were given: IF YOU HAVE QUESTIONS ABOUT YOUR PRE-OP INSTRUCTIONS PLEASE CALL [phone]-  [place].   If you received a COVID test during your pre-op visit  it is requested that you wear a mask when out in public, stay away from anyone that may not be feeling well and notify your surgeon if you develop symptoms. If you test positive for Covid or have been in contact with anyone that has tested positive in the last 10 days please notify you surgeon.    [facility] - Preparing for Surgery Before surgery, you can play an important role.  Because skin is not sterile, your skin needs to be as free of germs as possible.  You can reduce the number of germs on your skin by washing with CHG (chlorahexidine gluconate) soap before surgery.  CHG is an antiseptic cleaner which kills germs and bonds with the skin to continue killing germs even after washing. Please DO NOT use if you have an allergy to CHG or antibacterial soaps.  If your skin becomes reddened/irritated stop using the CHG and inform  your nurse when you arrive at Short Stay. Do not shave (including legs and underarms) for at least 48 hours prior to the first CHG shower.  You may shave your face/neck.  Please follow these instructions carefully:  1.  Shower with CHG Soap the night before surgery and the  morning of surgery.  2.  If you choose to wash your hair, wash your hair first as usual with your normal  shampoo.  3.  After you shampoo, rinse your hair and body thoroughly to remove the shampoo.                             4.  Use CHG as you would any other liquid soap.  You can apply chg directly to the skin and wash.  Gently with a scrungie or clean washcloth.  5.  Apply the CHG Soap to your body ONLY FROM THE NECK DOWN.   Do   not use on face/ open                           Wound or open sores. Avoid contact with eyes, ears mouth and   genitals (private parts).                       Wash face,  Genitals (private parts) with your normal soap.             6.  Wash thoroughly, paying special attention to the area where your    surgery  will be performed.  7.  Thoroughly rinse your body with warm water  from the neck down.  8.  DO NOT shower/wash with your normal soap after using and rinsing off the CHG Soap.                9.  Pat yourself dry with a clean towel.            10.  Wear clean pajamas.            11.  Place clean sheets on your bed the night of your first shower and do not  sleep with pets. Day of Surgery : Do not apply any lotions/deodorants the morning of surgery.  Please wear clean clothes to the hospital/surgery center.  FAILURE TO FOLLOW THESE INSTRUCTIONS MAY RESULT IN THE CANCELLATION OF YOUR SURGERY  PATIENT SIGNATURE_________________________________  NURSE SIGNATURE__________________________________  ________________________________________________________________________ 

## 2023-12-02 ENCOUNTER — Other Ambulatory Visit: Payer: PPO

## 2023-12-02 ENCOUNTER — Telehealth: Payer: Self-pay | Admitting: Internal Medicine

## 2023-12-02 ENCOUNTER — Encounter (HOSPITAL_COMMUNITY)
Admission: RE | Admit: 2023-12-02 | Discharge: 2023-12-02 | Disposition: A | Discharge status: Home / Self Care | Source: Ambulatory Visit | Attending: Surgery | Admitting: Surgery

## 2023-12-02 ENCOUNTER — Other Ambulatory Visit: Payer: Self-pay

## 2023-12-02 ENCOUNTER — Encounter (HOSPITAL_COMMUNITY): Payer: Self-pay

## 2023-12-02 VITALS — BP 142/62 | HR 86 | Temp 98.0°F | Resp 16 | Ht 62.0 in | Wt 160.0 lb

## 2023-12-02 DIAGNOSIS — I471 Supraventricular tachycardia, unspecified: Secondary | ICD-10-CM | POA: Insufficient documentation

## 2023-12-02 DIAGNOSIS — Z950 Presence of cardiac pacemaker: Secondary | ICD-10-CM | POA: Diagnosis not present

## 2023-12-02 DIAGNOSIS — I493 Ventricular premature depolarization: Secondary | ICD-10-CM | POA: Diagnosis not present

## 2023-12-02 DIAGNOSIS — K824 Cholesterolosis of gallbladder: Secondary | ICD-10-CM | POA: Insufficient documentation

## 2023-12-02 DIAGNOSIS — E0500 Thyrotoxicosis with diffuse goiter without thyrotoxic crisis or storm: Secondary | ICD-10-CM | POA: Insufficient documentation

## 2023-12-02 DIAGNOSIS — Z01812 Encounter for preprocedural laboratory examination: Secondary | ICD-10-CM | POA: Diagnosis not present

## 2023-12-02 DIAGNOSIS — Z01818 Encounter for other preprocedural examination: Secondary | ICD-10-CM | POA: Diagnosis present

## 2023-12-02 DIAGNOSIS — I10 Essential (primary) hypertension: Secondary | ICD-10-CM | POA: Insufficient documentation

## 2023-12-02 LAB — CBC
HCT: 43.9 % (ref 36.0–46.0)
Hemoglobin: 14.5 g/dL (ref 12.0–15.0)
MCH: 30.5 pg (ref 26.0–34.0)
MCHC: 33 g/dL (ref 30.0–36.0)
MCV: 92.4 fL (ref 80.0–100.0)
Platelets: 244 K/uL (ref 150–400)
RBC: 4.75 MIL/uL (ref 3.87–5.11)
RDW: 13.1 % (ref 11.5–15.5)
WBC: 5.3 K/uL (ref 4.0–10.5)
nRBC: 0 % (ref 0.0–0.2)

## 2023-12-02 LAB — BASIC METABOLIC PANEL WITH GFR
Anion gap: 12 (ref 5–15)
BUN: 21 mg/dL (ref 8–23)
CO2: 23 mmol/L (ref 22–32)
Calcium: 9.5 mg/dL (ref 8.9–10.3)
Chloride: 104 mmol/L (ref 98–111)
Creatinine, Ser: 1.1 mg/dL — ABNORMAL HIGH (ref 0.44–1.00)
GFR, Estimated: 51 mL/min — ABNORMAL LOW (ref >60)
Glucose, Bld: 83 mg/dL (ref 70–99)
Potassium: 4.3 mmol/L (ref 3.5–5.1)
Sodium: 140 mmol/L (ref 135–145)

## 2023-12-03 NOTE — Anesthesia Preprocedure Evaluation (Addendum)
 Anesthesia Evaluation  Patient identified by MRN, date of birth, ID band Patient awake    Reviewed: Allergy & Precautions, NPO status , Patient's Chart, lab work & pertinent test results  Airway Mallampati: III  TM Distance: >3 FB Neck ROM: Full    Dental no notable dental hx.    Pulmonary former smoker   Pulmonary exam normal        Cardiovascular hypertension, Pt. on home beta blockers Normal cardiovascular exam+ pacemaker      Neuro/Psych  PSYCHIATRIC DISORDERS Anxiety Depression     Neuromuscular disease    GI/Hepatic Neg liver ROS,,,  Endo/Other  Hypothyroidism    Renal/GU Renal InsufficiencyRenal disease     Musculoskeletal  (+) Arthritis ,    Abdominal   Peds  Hematology negative hematology ROS (+)   Anesthesia Other Findings GALLBLADDER POLYP  Reproductive/Obstetrics                              Anesthesia Physical Anesthesia Plan  ASA: 3  Anesthesia Plan: General   Post-op Pain Management:    Induction: Intravenous  PONV Risk Score and Plan: 4 or greater and Ondansetron , Dexamethasone , Propofol  infusion, Treatment may vary due to age or medical condition and Amisulpride   Airway Management Planned: Oral ETT  Additional Equipment:   Intra-op Plan:   Post-operative Plan: Extubation in OR  Informed Consent: I have reviewed the patients History and Physical, chart, labs and discussed the procedure including the risks, benefits and alternatives for the proposed anesthesia with the patient or authorized representative who has indicated his/her understanding and acceptance.     Dental advisory given  Plan Discussed with: CRNA  Anesthesia Plan Comments: (PAT note 12/02/2023 Magnet over pacemaker. Patient is not pacemaker dependent. )         Anesthesia Quick Evaluation

## 2023-12-03 NOTE — Progress Notes (Signed)
 Anesthesia Chart Review   Case: [id number] Date/Time: 12/08/23 1100   Procedure: LAPAROSCOPIC CHOLECYSTECTOMY   Anesthesia type: General   Diagnosis: Gallbladder polyp [K82.4]   Pre-op diagnosis: GALLBLADDER POLYP   [facility] 08 / [facility]   Surgeons: Paola Dreama SAILOR, MD       DISCUSSION:78 y.o. former smoker with h/o HTN, Grave's disease, PVCs, atrial tachycardia, symptomatic bradycardia s/p PPM, gallbladder polyp scheduled for above procedure 12/08/23 with Dr. Dreama Paola.   Device orders in 12/01/2023 progress note, procedure may interfere with device function, magnet should be placed over device during procedure.   Pt last seen by cardiology 11/17/2023. Per OV note, Cindy [name]'s perioperative risk of a major cardiac event is 0.9% according to the Revised Cardiac Risk Index (RCRI).  Therefore, she is at low risk for perioperative complications.   Her functional capacity is good at 6.05 METs according to the Duke Activity Status Index (DASI). Recommendations: According to ACC/AHA guidelines, no further cardiovascular testing needed.  The patient may proceed to surgery at acceptable risk.  VS: BP (!) 142/62   Pulse 86   Temp 36.7 C (Oral)   Resp 16   Ht 5' 2 (1.575 m)   Wt 72.6 kg   SpO2 98%   BMI 29.26 kg/m   PROVIDERS: Stephane Leita DEL, MD is PCP   Electrophysiologist:  Elspeth Sage, MD  LABS: Labs reviewed: Acceptable for surgery. (all labs ordered are listed, but only abnormal results are displayed)  Labs Reviewed  BASIC METABOLIC PANEL WITH GFR - Abnormal; Notable for the following components:      Result Value   Creatinine, Ser 1.10 (*)    GFR, Estimated 51 (*)    All other components within normal limits  CBC     IMAGES:   EKG:   CV: Echo 03/28/2010 - Left ventricle: The cavity size was normal. Wall thickness was     normal. Systolic function was normal. The estimated ejection     fraction was in the range of 60% to 65%. Wall motion was normal;      there were no regional wall motion abnormalities. Doppler     parameters are consistent with abnormal left ventricular     relaxation (grade 1 diastolic dysfunction).   - Left atrium: The atrium was mildly to moderately dilated.  Past Medical History:  Diagnosis Date   Abnormal nuclear stress test 12/15/2013   Anemia    hx iron def, no GI loss indentifed - resolved with diet change     Anxiety    Arthritis    Degenerative disc disease, cervical    Depression    Dysrhythmia    GERD (gastroesophageal reflux disease)    GRAVES' DISEASE 2013   I-131 ablation, now post tx hypothyroid state   History of hiatal hernia    HYPERTENSION    Impaired glucose tolerance test    Keloid 12/31/2010   incisional    Pacemaker -Biotronik-CLS 08/20/2010   Fainted easily   Postablative hypothyroidism    Qualifier: Diagnosis of  By: Brien CMA, Nova     Pre-diabetes    Sciatica    Syncope 08/20/2010   s/p PPM 08/2010 for sxc pause on loop recorder causing same   Unspecified vitamin D deficiency     Past Surgical History:  Procedure Laterality Date   APPENDECTOMY     blephroplasty     BREAST BIOPSY Left 2001   guided excisional bx (L) breast microcalcification at 12 o'clock- Benign  BUNIONECTOMY Left    CATARACT EXTRACTION  2014   FRACTURE SURGERY     ORIF 06-2010   HIATAL HERNIA REPAIR N/A 04/10/2020   Procedure: LAPAROSCOPIC REPAIR OF TYPE 3 HIATAL HERNIA WITH NISSEN FUNDOPLICATION;  Surgeon: Gladis Cough, MD;  Location[facility];  Service: General;  Laterality: N/A;   KNEE ARTHROSCOPY Left 10/28/2021   Procedure: ARTHROSCOPY KNEE;  Surgeon: Yvone Rush, MD;  Location: [facility];  Service: Orthopedics;  Laterality: Left;   LEFT HEART CATHETERIZATION WITH CORONARY ANGIOGRAM N/A 12/22/2013   Procedure: LEFT HEART CATHETERIZATION WITH CORONARY ANGIOGRAM;  Surgeon: Victory LELON Claudene DOUGLAS, MD;  Location: [facility] C[facility] LAB;  Service: Cardiovascular;  Laterality: N/A;   OSTEOTOMY Left 2025   PACEMAKER  INSERTION     REVERSE SHOULDER ARTHROPLASTY Right 04/30/2023   Procedure: REVERSE SHOULDER ARTHROPLASTY;  Surgeon: Dozier Soulier, MD;  [facility];  Service: Orthopedics;  Laterality: Right;   Right ankle  06/2010   Trimalleolar fracture   TOTAL KNEE ARTHROPLASTY Left 03/17/2022   Procedure: TOTAL KNEE ARTHROPLASTY;  Surgeon: Yvone Rush, MD;  [facility];  Service: Orthopedics;  Laterality: Left;   TOTAL SHOULDER ARTHROPLASTY Left 01/27/2019   Procedure: TOTAL SHOULDER ARTHROPLASTY;  Surgeon: Dozier Soulier, MD;  [facility];  Service: Orthopedics;  Laterality: Left;    MEDICATIONS:  acetaminophen  (TYLENOL ) 500 MG tablet   ALPRAZolam  (XANAX ) 0.5 MG tablet   amLODipine  (NORVASC ) 5 MG tablet   buPROPion  (WELLBUTRIN  XL) 150 MG 24 hr tablet   cholecalciferol (VITAMIN D3) 25 MCG (1000 UNIT) tablet   levothyroxine  (SYNTHROID ) 112 MCG tablet   Multiple Vitamin (MULTIVITAMIN WITH MINERALS) TABS tablet   nebivolol  (BYSTOLIC ) 5 MG tablet   PARoxetine  (PAXIL ) 10 MG tablet   pregabalin (LYRICA) 75 MG capsule   spironolactone  (ALDACTONE ) 50 MG tablet   No current facility-administered medications for this encounter.      Harlene Hoots Ward, PA-C [facility] [phone]

## 2023-12-08 ENCOUNTER — Ambulatory Visit (HOSPITAL_COMMUNITY): Payer: Self-pay | Admitting: Medical

## 2023-12-08 ENCOUNTER — Encounter (HOSPITAL_COMMUNITY): Payer: Self-pay | Admitting: Surgery

## 2023-12-08 ENCOUNTER — Ambulatory Visit (HOSPITAL_BASED_OUTPATIENT_CLINIC_OR_DEPARTMENT_OTHER): Admitting: Anesthesiology

## 2023-12-08 ENCOUNTER — Ambulatory Visit (HOSPITAL_COMMUNITY)
Admission: RE | Admit: 2023-12-08 | Discharge: 2023-12-08 | Disposition: A | Discharge status: Home / Self Care | Attending: Surgery | Admitting: Surgery

## 2023-12-08 ENCOUNTER — Encounter (HOSPITAL_COMMUNITY)
Admission: RE | Disposition: A | Discharge status: Home / Self Care | Payer: Self-pay | Source: Home / Self Care | Attending: Surgery

## 2023-12-08 DIAGNOSIS — I10 Essential (primary) hypertension: Secondary | ICD-10-CM

## 2023-12-08 DIAGNOSIS — F32A Depression, unspecified: Secondary | ICD-10-CM | POA: Diagnosis not present

## 2023-12-08 DIAGNOSIS — Z950 Presence of cardiac pacemaker: Secondary | ICD-10-CM | POA: Diagnosis not present

## 2023-12-08 DIAGNOSIS — E039 Hypothyroidism, unspecified: Secondary | ICD-10-CM

## 2023-12-08 DIAGNOSIS — F419 Anxiety disorder, unspecified: Secondary | ICD-10-CM | POA: Insufficient documentation

## 2023-12-08 DIAGNOSIS — F418 Other specified anxiety disorders: Secondary | ICD-10-CM

## 2023-12-08 DIAGNOSIS — K811 Chronic cholecystitis: Secondary | ICD-10-CM | POA: Diagnosis not present

## 2023-12-08 DIAGNOSIS — M1990 Unspecified osteoarthritis, unspecified site: Secondary | ICD-10-CM | POA: Diagnosis not present

## 2023-12-08 DIAGNOSIS — Z87891 Personal history of nicotine dependence: Secondary | ICD-10-CM | POA: Diagnosis not present

## 2023-12-08 DIAGNOSIS — Z9049 Acquired absence of other specified parts of digestive tract: Secondary | ICD-10-CM

## 2023-12-08 DIAGNOSIS — K824 Cholesterolosis of gallbladder: Secondary | ICD-10-CM

## 2023-12-08 DIAGNOSIS — E890 Postprocedural hypothyroidism: Secondary | ICD-10-CM | POA: Insufficient documentation

## 2023-12-08 HISTORY — PX: CHOLECYSTECTOMY: SHX55

## 2023-12-08 SURGERY — LAPAROSCOPIC CHOLECYSTECTOMY
Anesthesia: General | Site: Abdomen

## 2023-12-08 MED ORDER — ROCURONIUM BROMIDE 10 MG/ML (PF) SYRINGE
PREFILLED_SYRINGE | INTRAVENOUS | Status: AC
  Filled 2023-12-08: qty 10

## 2023-12-08 MED ORDER — 0.9 % SODIUM CHLORIDE (POUR BTL) OPTIME
TOPICAL | Status: DC | PRN
  Administered 2023-12-08: 1000 mL

## 2023-12-08 MED ORDER — PROPOFOL 10 MG/ML IV BOLUS
INTRAVENOUS | Status: DC | PRN
  Administered 2023-12-08: 200 mg via INTRAVENOUS

## 2023-12-08 MED ORDER — CEFAZOLIN SODIUM-DEXTROSE 2-4 GM/100ML-% IV SOLN
2.0000 g | INTRAVENOUS | Status: AC
  Administered 2023-12-08: 2 g via INTRAVENOUS
  Filled 2023-12-08: qty 100

## 2023-12-08 MED ORDER — ONDANSETRON HCL 4 MG/2ML IJ SOLN
INTRAMUSCULAR | Status: DC | PRN
  Administered 2023-12-08: 4 mg via INTRAVENOUS

## 2023-12-08 MED ORDER — ROCURONIUM BROMIDE 10 MG/ML (PF) SYRINGE
PREFILLED_SYRINGE | INTRAVENOUS | Status: DC | PRN
  Administered 2023-12-08: 60 mg via INTRAVENOUS

## 2023-12-08 MED ORDER — AMISULPRIDE (ANTIEMETIC) 5 MG/2ML IV SOLN
INTRAVENOUS | Status: AC
  Filled 2023-12-08: qty 2

## 2023-12-08 MED ORDER — FENTANYL CITRATE (PF) 100 MCG/2ML IJ SOLN
INTRAMUSCULAR | Status: DC | PRN
  Administered 2023-12-08: 100 ug via INTRAVENOUS

## 2023-12-08 MED ORDER — SUGAMMADEX SODIUM 200 MG/2ML IV SOLN
INTRAVENOUS | Status: DC | PRN
  Administered 2023-12-08: 160 mg via INTRAVENOUS

## 2023-12-08 MED ORDER — ONDANSETRON HCL 4 MG/2ML IJ SOLN
INTRAMUSCULAR | Status: AC
  Filled 2023-12-08: qty 2

## 2023-12-08 MED ORDER — LACTATED RINGERS IV SOLN: INTRAVENOUS | Status: DC

## 2023-12-08 MED ORDER — BUPIVACAINE-EPINEPHRINE 0.25% -1:200000 IJ SOLN
INTRAMUSCULAR | Status: DC | PRN
  Administered 2023-12-08: 30 mL

## 2023-12-08 MED ORDER — FENTANYL CITRATE PF 50 MCG/ML IJ SOSY
PREFILLED_SYRINGE | INTRAMUSCULAR | Status: AC
  Filled 2023-12-08: qty 1

## 2023-12-08 MED ORDER — ACETAMINOPHEN 10 MG/ML IV SOLN
INTRAVENOUS | Status: DC | PRN
  Administered 2023-12-08: 1000 mg via INTRAVENOUS

## 2023-12-08 MED ORDER — ACETAMINOPHEN 500 MG PO TABS: 1000.0000 mg | ORAL_TABLET | ORAL | Status: DC

## 2023-12-08 MED ORDER — FENTANYL CITRATE PF 50 MCG/ML IJ SOSY
25.0000 ug | PREFILLED_SYRINGE | INTRAMUSCULAR | Status: DC | PRN
  Administered 2023-12-08: 50 ug via INTRAVENOUS

## 2023-12-08 MED ORDER — AMISULPRIDE (ANTIEMETIC) 5 MG/2ML IV SOLN
INTRAVENOUS | Status: DC | PRN
  Administered 2023-12-08: 5 mg via INTRAVENOUS

## 2023-12-08 MED ORDER — SODIUM CHLORIDE 0.9 % IR SOLN
Status: DC | PRN
  Administered 2023-12-08: 1000 mL

## 2023-12-08 MED ORDER — CHLORHEXIDINE GLUCONATE CLOTH 2 % EX PADS: 6.0000 | MEDICATED_PAD | Freq: Once | CUTANEOUS | Status: DC

## 2023-12-08 MED ORDER — PROPOFOL 500 MG/50ML IV EMUL
INTRAVENOUS | Status: DC | PRN
  Administered 2023-12-08: 25 ug/kg/min via INTRAVENOUS

## 2023-12-08 MED ORDER — ACETAMINOPHEN 10 MG/ML IV SOLN: 1000.0000 mg | Freq: Once | INTRAVENOUS | Status: DC | PRN

## 2023-12-08 MED ORDER — POLYETHYLENE GLYCOL 3350 17 G PO PACK: 17.0000 g | PACK | Freq: Every day | ORAL | 1 refills | Status: AC

## 2023-12-08 MED ORDER — DEXAMETHASONE SODIUM PHOSPHATE 10 MG/ML IJ SOLN
INTRAMUSCULAR | Status: DC | PRN
  Administered 2023-12-08: 6 mg via INTRAVENOUS

## 2023-12-08 MED ORDER — LIDOCAINE HCL (CARDIAC) PF 100 MG/5ML IV SOSY
PREFILLED_SYRINGE | INTRAVENOUS | Status: DC | PRN
  Administered 2023-12-08: 60 mg via INTRAVENOUS

## 2023-12-08 MED ORDER — IBUPROFEN 600 MG PO TABS: 600.0000 mg | ORAL_TABLET | Freq: Four times a day (QID) | ORAL | 1 refills | Status: AC

## 2023-12-08 MED ORDER — OXYCODONE HCL 5 MG PO TABS: 5.0000 mg | ORAL_TABLET | ORAL | 0 refills | Status: AC | PRN

## 2023-12-08 MED ORDER — FENTANYL CITRATE (PF) 100 MCG/2ML IJ SOLN
INTRAMUSCULAR | Status: AC
  Filled 2023-12-08: qty 2

## 2023-12-08 MED ORDER — BUPIVACAINE LIPOSOME 1.3 % IJ SUSP
INTRAMUSCULAR | Status: AC
  Filled 2023-12-08: qty 20

## 2023-12-08 MED ORDER — DEXAMETHASONE SODIUM PHOSPHATE 10 MG/ML IJ SOLN
INTRAMUSCULAR | Status: AC
  Filled 2023-12-08: qty 1

## 2023-12-08 MED ORDER — ORAL CARE MOUTH RINSE: 15.0000 mL | Freq: Once | OROMUCOSAL | Status: AC

## 2023-12-08 MED ORDER — LIDOCAINE HCL (PF) 2 % IJ SOLN
INTRAMUSCULAR | Status: AC
  Filled 2023-12-08: qty 5

## 2023-12-08 MED ORDER — AMISULPRIDE (ANTIEMETIC) 5 MG/2ML IV SOLN: 10.0000 mg | Freq: Once | INTRAVENOUS | Status: DC | PRN

## 2023-12-08 MED ORDER — SUGAMMADEX SODIUM 200 MG/2ML IV SOLN
INTRAVENOUS | Status: AC
  Filled 2023-12-08: qty 2

## 2023-12-08 MED ORDER — ACETAMINOPHEN 10 MG/ML IV SOLN
INTRAVENOUS | Status: AC
  Filled 2023-12-08: qty 100

## 2023-12-08 MED ORDER — METHOCARBAMOL 750 MG PO TABS: 750.0000 mg | ORAL_TABLET | Freq: Four times a day (QID) | ORAL | 1 refills | Status: AC

## 2023-12-08 MED ORDER — CHLORHEXIDINE GLUCONATE 0.12 % MT SOLN
15.0000 mL | Freq: Once | OROMUCOSAL | Status: AC
  Administered 2023-12-08: 15 mL via OROMUCOSAL

## 2023-12-08 MED ORDER — PROPOFOL 10 MG/ML IV BOLUS
INTRAVENOUS | Status: AC
  Filled 2023-12-08: qty 20

## 2023-12-08 MED ORDER — ACETAMINOPHEN 500 MG PO TABS: 1000.0000 mg | ORAL_TABLET | Freq: Four times a day (QID) | ORAL | 3 refills | Status: AC

## 2023-12-08 MED ORDER — ONDANSETRON HCL 4 MG/2ML IJ SOLN: 4.0000 mg | Freq: Once | INTRAMUSCULAR | Status: DC | PRN

## 2023-12-08 MED ORDER — BUPIVACAINE-EPINEPHRINE (PF) 0.25% -1:200000 IJ SOLN
INTRAMUSCULAR | Status: AC
  Filled 2023-12-08: qty 30

## 2023-12-08 MED ORDER — ENOXAPARIN SODIUM 40 MG/0.4ML IJ SOSY
40.0000 mg | PREFILLED_SYRINGE | Freq: Once | INTRAMUSCULAR | Status: AC
  Administered 2023-12-08: 40 mg via SUBCUTANEOUS
  Filled 2023-12-08: qty 0.4

## 2023-12-08 SURGICAL SUPPLY — 37 items
BLADE CLIPPER SURG (BLADE) IMPLANT
CABLE HIGH FREQUENCY MONO STRZ (ELECTRODE) ×2 IMPLANT
CHLORAPREP W/TINT 26 (MISCELLANEOUS) ×2 IMPLANT
CLIP APPLIE 5 13 M/L LIGAMAX5 (MISCELLANEOUS) ×2 IMPLANT
COVER MAYO STAND XLG (MISCELLANEOUS) IMPLANT
COVER SURGICAL LIGHT HANDLE (MISCELLANEOUS) ×2 IMPLANT
DERMABOND ADVANCED .7 DNX12 (GAUZE/BANDAGES/DRESSINGS) ×2 IMPLANT
DISSECTOR BLUNT TIP ENDO 5MM (MISCELLANEOUS) IMPLANT
DRAPE C-ARM 42X120 X-RAY (DRAPES) IMPLANT
ELECT PENCIL ROCKER SW 15FT (MISCELLANEOUS) ×2 IMPLANT
ELECT REM PT RETURN 15FT ADLT (MISCELLANEOUS) ×2 IMPLANT
ENDOLOOP SUT PDS II 0 18 (SUTURE) IMPLANT
GLOVE BIO SURGEON STRL SZ 6.5 (GLOVE) ×2 IMPLANT
GLOVE BIOGEL PI IND STRL 6 (GLOVE) ×2 IMPLANT
GOWN STRL REUS W/ TWL LRG LVL3 (GOWN DISPOSABLE) ×2 IMPLANT
GOWN STRL REUS W/ TWL XL LVL3 (GOWN DISPOSABLE) IMPLANT
IRRIGATION SUCT STRKRFLW 2 WTP (MISCELLANEOUS) IMPLANT
KIT BASIN OR (CUSTOM PROCEDURE TRAY) ×2 IMPLANT
LHOOK LAP DISP 36CM (ELECTROSURGICAL) IMPLANT
NDL INSUFFLATION 14GA 120MM (NEEDLE) IMPLANT
NEEDLE INSUFFLATION 14GA 120MM (NEEDLE) IMPLANT
NS IRRIG 1000ML POUR BTL (IV SOLUTION) ×2 IMPLANT
PAD ARMBOARD POSITIONER FOAM (MISCELLANEOUS) ×2 IMPLANT
POUCH RETRIEVAL ECOSAC 10 (ENDOMECHANICALS) ×2 IMPLANT
SCISSORS LAP 5X35 DISP (ENDOMECHANICALS) ×2 IMPLANT
SET CHOLANGIOGRAPH MIX (MISCELLANEOUS) IMPLANT
SET TUBE SMOKE EVAC HIGH FLOW (TUBING) ×2 IMPLANT
SLEEVE ADV FIXATION 5X100MM (TROCAR) ×4 IMPLANT
SUT MNCRL AB 4-0 PS2 18 (SUTURE) ×2 IMPLANT
SUT VIC AB 0 UR5 27 (SUTURE) IMPLANT
SUT VICRYL 0 UR6 27IN ABS (SUTURE) IMPLANT
SYSTEM BAG RETRIEVAL 10MM (BASKET) IMPLANT
TRAY LAPAROSCOPIC (CUSTOM PROCEDURE TRAY) ×2 IMPLANT
TROCAR ADV FIXATION 5X100MM (TROCAR) ×2 IMPLANT
TROCAR BALLN 12MMX100 BLUNT (TROCAR) ×2 IMPLANT
TROCAR Z-THREAD FIOS 5X100MM (TROCAR) IMPLANT
WATER STERILE IRR 1000ML POUR (IV SOLUTION) ×2 IMPLANT

## 2023-12-08 NOTE — Progress Notes (Signed)
 Pt prefers that family not come back preop or post op

## 2023-12-08 NOTE — Op Note (Signed)
   Operative Note  Date: 12/08/2023  Procedure: laparoscopic cholecystectomy  Pre-op diagnosis: enlarging gallbladder polyp Post-op diagnosis: same  Indication and clinical history: The patient is a 78 y.o. year old female with an enlarging gallbladder polyp  Surgeon: Dreama GEANNIE Hanger, MD  Anesthesiologist: Patrisha, MD Anesthesia: General  Findings:  Specimen: gallbladder EBL: <5cc Drains/Implants: none  Disposition: PACU - hemodynamically stable.  Description of procedure: The patient was positioned supine on the operating room table. Time-out was performed verifying correct patient, procedure, signature of informed consent, and administration of pre-operative antibiotics, VTE prophylaxis with low molecular weight heparin . General anesthetic induction and intubation were uneventful. The abdomen was prepped and draped in the usual sterile fashion. An infra-umbilical incision was made using an open technique using zero vicryl stay sutures on either side of the fascia and a 10mm Hassan port inserted. After establishing pneumoperitoneum, which the patient tolerated well, the abdominal cavity was inspected and no injury of any intra-abdominal structures was identified. Additional ports were placed under direct visualization and using local anesthetic: two 5mm ports in the right subcostal region and a 5mm port in the epigastric region. The patient was re-positioned to reverse Trendelenburg and right side up. Adhesiolysis was performed to expose the gallbladder, which was then retracted cephalad. The infundibulum was identified and retracted toward the right lower quadrant. The peritoneum was incised over the infundibulum and the triangle of Calot dissected to expose the critical view of safety. With clear identification and isolation of the cystic duct and cystic artery, the cystic artery was doubly clipped and divided. After this, the cystic duct was identified as a single structure entering the  gallbladder, and was also doubly clipped and divided. The gallbladder was dissected off the liver bed using electrocautery and hemostasis of the liver bed was confirmed prior to separation of the final peritoneal attachments of the gallbladder to the liver bed. The gallbladder fossa was irrigated and fluid returned clear. After transection of the final peritoneal attachments, the gallbladder was placed in an endoscopic specimen retrieval bag, removed via the umbilical port site, and sent to pathology as a permanent specimen. The gallbladder fossa was inspected confirming hemostasis, the absence of bile leakage from the cystic duct stump, and correct placement of clips on the cystic artery and cystic duct stumps. The abdomen was desufflated and the fascia of the umbilical port site was closed using the previously placed stay sutures. Additional local anesthetic was administered at the umbilical port site.  The skin of all incisions was closed with 4-0 monocryl. Sterile dressings were applied. All sponge and instrument counts were correct at the conclusion of the procedure. The patient was awakened from anesthesia, extubated uneventfully, and transported to the PACU - hemodynamically stable.. There were no complications.    Dreama GEANNIE Hanger, MD General and Trauma Surgery [facility]

## 2023-12-08 NOTE — Anesthesia Procedure Notes (Signed)
 Procedure Name: Intubation Date/Time: 12/08/2023 12:02 PM  Performed by: Therisa Doyal CROME, CRNAPre-anesthesia Checklist: Patient identified, Emergency Drugs available, Suction available and Patient being monitored Patient Re-evaluated:Patient Re-evaluated prior to induction Oxygen Delivery Method: Circle system utilized Preoxygenation: Pre-oxygenation with 100% oxygen Induction Type: IV induction Ventilation: Mask ventilation without difficulty and Oral airway inserted - appropriate to patient size Laryngoscope Size: Cleotilde and 2 Grade View: Grade I Tube type: Oral Tube size: 7.5 mm Number of attempts: 1 Airway Equipment and Method: Stylet Placement Confirmation: ETT inserted through vocal cords under direct vision, positive ETCO2 and breath sounds checked- equal and bilateral Secured at: 20 cm Tube secured with: Tape Dental Injury: Teeth and Oropharynx as per pre-operative assessment

## 2023-12-08 NOTE — H&P (Signed)
 Cindy [name] is an 78 y.o. female.   HPI: 82F with enlarging GB polyp, plan lap chole. The patient has had no hospitalizations, doctors visits, ER visits, or newly diagnosed allergies since being seen in the office. She did have a bunionectomy revision since being seen in the office.    Past Medical History:  Diagnosis Date   Abnormal nuclear stress test 12/15/2013   Anemia    hx iron def, no GI loss indentifed - resolved with diet change     Anxiety    Arthritis    Degenerative disc disease, cervical    Depression    Dysrhythmia    GERD (gastroesophageal reflux disease)    GRAVES' DISEASE 2013   I-131 ablation, now post tx hypothyroid state   History of hiatal hernia    HYPERTENSION    Impaired glucose tolerance test    Keloid 12/31/2010   incisional    Pacemaker -Biotronik-CLS 08/20/2010   Fainted easily   Postablative hypothyroidism    Qualifier: Diagnosis of  By: Brien CMA, Nova     Pre-diabetes    Sciatica    Syncope 08/20/2010   s/p PPM 08/2010 for sxc pause on loop recorder causing same   Unspecified vitamin D deficiency     Past Surgical History:  Procedure Laterality Date   APPENDECTOMY     blephroplasty     BREAST BIOPSY Left 2001   guided excisional bx (L) breast microcalcification at 12 o'clock- Benign   BUNIONECTOMY Left    CATARACT EXTRACTION  2014   FRACTURE SURGERY     ORIF 06-2010   HIATAL HERNIA REPAIR N/A 04/10/2020   Procedure: LAPAROSCOPIC REPAIR OF TYPE 3 HIATAL HERNIA WITH NISSEN FUNDOPLICATION;  Surgeon: Gladis Cough, MD;  [facility];  Service: General;  Laterality: N/A;   KNEE ARTHROSCOPY Left 10/28/2021   Procedure: ARTHROSCOPY KNEE;  Surgeon: Yvone Rush, MD;  [facility];  Service: Orthopedics;  Laterality: Left;   LEFT HEART CATHETERIZATION WITH CORONARY ANGIOGRAM N/A 12/22/2013   Procedure: LEFT HEART CATHETERIZATION WITH CORONARY ANGIOGRAM;  Surgeon: Victory LELON Claudene DOUGLAS, MD;  Location: [facility] CATH LAB;  Service:  Cardiovascular;  Laterality: N/A;   OSTEOTOMY Left 2025   PACEMAKER INSERTION     REVERSE SHOULDER ARTHROPLASTY Right 04/30/2023   Procedure: REVERSE SHOULDER ARTHROPLASTY;  Surgeon: Dozier Soulier, MD;  [facility];  Service: Orthopedics;  Laterality: Right;   Right ankle  06/2010   Trimalleolar fracture   TOTAL KNEE ARTHROPLASTY Left 03/17/2022   Procedure: TOTAL KNEE ARTHROPLASTY;  Surgeon: Yvone Rush, MD;  [facility];  Service: Orthopedics;  Laterality: Left;   TOTAL SHOULDER ARTHROPLASTY Left 01/27/2019   Procedure: TOTAL SHOULDER ARTHROPLASTY;  Surgeon: Dozier Soulier, MD;  [facility];  Service: Orthopedics;  Laterality: Left;    Family History  Problem Relation Age of Onset   Rheum arthritis Mother    Emphysema Mother    Hypertension Mother    Osteoporosis Mother    COPD Mother    Prostate cancer Father    Hypertension Father    Heart attack Father    Osteoporosis Maternal Grandmother    Stroke Other     Social History:  reports that she quit smoking about 56 years ago. Her smoking use included cigarettes. She has never used smokeless tobacco. She reports current alcohol  use of about 2.0 standard drinks of alcohol  per week. She reports that she does not use drugs.  Allergies:  Allergies  Allergen Reactions  Ace Inhibitors Swelling   Lisinopril  Swelling    angiodema   Codeine Nausea And Vomiting    Medications: I have reviewed the patient's current medications.  No results found for this or any previous visit (from the past 48 hours).  No results found.  ROS 10 point review of systems is negative except as listed above in HPI.   Physical Exam Blood pressure (!) 146/68, pulse 73, temperature 97.9 F (36.6 C), temperature source Oral, resp. rate 16, SpO2 96%. Constitutional: well-developed, well-nourished HEENT: pupils equal, round, reactive to light, 2mm b/l, moist conjunctiva, external inspection of ears and nose normal, hearing  intact Oropharynx: normal oropharyngeal mucosa, normal dentition Neck: no thyromegaly, trachea midline, no midline cervical tenderness to palpation Chest: breath sounds equal bilaterally, normal respiratory effort, no midline or lateral chest wall tenderness to palpation/deformity Abdomen: soft, NT, no bruising, no hepatosplenomegaly Skin: warm, dry, no rashes Psych: normal memory, normal mood/affect     Assessment/Plan: Enlarging GB polyp - plan lap chole. Informed consent was obtained after detailed explanation of risks, including bleeding, infection, biloma, hematoma, injury to common bile duct, need for IOC to delineate anatomy, and need for conversion to open procedure. All questions answered to the patient's satisfaction. FEN - NPO except sips/chips DVT - SCDs, LMWH Dispo - plan home post-op    Dreama GEANNIE Hanger, MD General and Trauma Surgery [facility]

## 2023-12-08 NOTE — Discharge Instructions (Signed)
 [facility]  LAPAROSCOPIC SURGERY: POST OP INSTRUCTIONS Always review your discharge instruction sheet given to you by the facility where your surgery was performed. IF YOU HAVE DISABILITY OR FAMILY LEAVE FORMS, YOU MUST BRING THEM TO THE OFFICE FOR PROCESSING.   DO NOT GIVE THEM TO YOUR DOCTOR.  PAIN CONTROL  Pain regimen: take over-the-counter tylenol  (acetaminophen ) 1000mg  every six hours, the prescription ibuprofen  (600mg ) every six hours and the robaxin  (methocarbamol ) 750mg  every six hours. With all three of these, you should be taking something every two hours. Example: tylenol  (acetaminophen ) at 8am, ibuprofen  at 10am, robaxin  (methocarbamol ) at 12pm, tylenol  (acetaminophen ) again at 2pm, ibuprofen  again at 4pm, robaxin  (methocarbamol ) at 6pm. You also have a prescription for oxycodone , which should be taken if the tylenol  (acetaminophen ), ibuprofen , and robaxin  (methocarbamol ) are not enough to control your pain. You may take the oxycodone  as frequently as every four hours as needed, but if you are taking the other medications as above, you should not need the oxycodone  this frequently. You have also been given a prescription for Miralax  which is a stool softener. Please take this as prescribed because the oxycodone  can cause constipation and the Miralax  will minimize or prevent constipation. Do not drive while taking or under the influence of the oxycodone  as it is a narcotic medication. Use ice packs to help control pain.  If you need a refill on your pain medication, please contact your pharmacy.  They will contact our office to request authorization. Prescriptions will not be filled after 5pm or on week-ends.  HOME MEDICATIONS Take your usually prescribed medications unless otherwise directed.  DIET You should follow a light diet the first few days after arrival home.  Be sure to include lots of fluids daily.  Do not consume alcohol  while taking oxycodone  or  ibuprofen .   CONSTIPATION It is common to experience some constipation after surgery and if you are taking pain medication. Constipation will make your abdominal pain worse, so it is best to try to prevent it by increasing fluid intake and taking a stool softener. You have already been given a prescription for a mild laxative, Miralax , which you should be taking once daily. You can increase the Miralax  to twice daily or even three times daily until you have a bowel movement. If still no bowel movement 24 hours after taking Miralax  three times in one day, you may try magnesium  citrate, available over the counter at a local pharmacy.   WOUND/INCISION CARE Most patients will experience some swelling and bruising in the area of the incisions.  Ice packs will help.  Swelling and bruising can take several days to resolve.  May shower beginning 12/09/2023.  Do not peel off or scrub skin glue. May allow warm soapy water  to run over incision, then rinse and pat dry.  Do not soak in any water  (tubs, hot tubs, pools, lakes, oceans) for one week.   ACTIVITIES You may resume regular (light) daily activities beginning the next day--such as daily self-care, walking, climbing stairs--gradually increasing activities as tolerated.  You may have sexual intercourse when it is comfortable.   No lifting greater than 5 pounds for six weeks.  You may drive when you are no longer taking narcotic pain medication, you can comfortably wear a seatbelt, and you can safely maneuver your car and apply brakes.  FOLLOW-UP You should see your doctor in the office for a follow-up appointment approximately 2-3 weeks after your surgery.  You should have  been given your post-op/follow-up appointment when your surgery was scheduled.  If you did not receive a post-op/follow-up appointment, make sure that you call for this appointment within a day or two after you arrive home to ensure a convenient appointment time.  WHEN TO CALL YOUR  DOCTOR: Fever over 101.5 Inability to urinate Continued bleeding from incision. Increased pain, redness, or drainage from the incision. Increasing abdominal pain  The clinic staff is available to answer your questions during regular business hours.  Please don't hesitate to call and ask to speak to one of the nurses for clinical concerns.  If you have a medical emergency, go to the nearest emergency room or call 911.  A surgeon from [facility] is always on call at the hospital. [address], [address], [place], [place]  [address] ? [address] [address], [place], [place]   [address] [phone] ? [phone] ? FAX [phone] Web site: [email or web address]

## 2023-12-08 NOTE — Transfer of Care (Signed)
 Immediate Anesthesia Transfer of Care Note  Patient: Cindy [name]  Procedure(s) Performed: LAPAROSCOPIC CHOLECYSTECTOMY (Abdomen)  Patient Location: PACU  Anesthesia Type:General  Level of Consciousness: drowsy  Airway & Oxygen Therapy: Patient Spontanous Breathing and Patient connected to face mask oxygen  Post-op Assessment: Report given to RN and Post -op Vital signs reviewed and stable  Post vital signs: Reviewed and stable  Last Vitals:  Vitals Value Taken Time  BP 158/95 12/08/23 12:56  Temp    Pulse 65 12/08/23 12:58  Resp 29 12/08/23 12:58  SpO2 95 % 12/08/23 12:58  Vitals shown include unfiled device data.  Last Pain:  Vitals:   12/08/23 1000  TempSrc:   PainSc: 0-No pain      Patients Stated Pain Goal: 4 (12/08/23 1000)  Complications: No notable events documented.

## 2023-12-09 ENCOUNTER — Encounter (HOSPITAL_COMMUNITY): Payer: Self-pay | Admitting: Surgery

## 2023-12-09 NOTE — Anesthesia Postprocedure Evaluation (Signed)
 Anesthesia Post Note  Patient: Cindy [name]  Procedure(s) Performed: LAPAROSCOPIC CHOLECYSTECTOMY (Abdomen)     Patient location during evaluation: PACU Anesthesia Type: General Level of consciousness: awake Pain management: pain level controlled Vital Signs Assessment: post-procedure vital signs reviewed and stable Respiratory status: spontaneous breathing, nonlabored ventilation and respiratory function stable Cardiovascular status: blood pressure returned to baseline and stable Postop Assessment: no apparent nausea or vomiting Anesthetic complications: no   No notable events documented.  Last Vitals:  Vitals:   12/08/23 1430 12/08/23 1500  BP: 130/61 139/89  Pulse: 62 (!) 59  Resp: (!) 24   Temp:    SpO2: 100% 97%    Last Pain:  Vitals:   12/08/23 1500  TempSrc:   PainSc: 0-No pain                 Tarrin Menn P Cayton Cuevas

## 2023-12-10 LAB — SURGICAL PATHOLOGY

## 2023-12-16 DIAGNOSIS — E785 Hyperlipidemia, unspecified: Secondary | ICD-10-CM | POA: Diagnosis not present

## 2023-12-16 DIAGNOSIS — M8580 Other specified disorders of bone density and structure, unspecified site: Secondary | ICD-10-CM | POA: Diagnosis not present

## 2023-12-16 DIAGNOSIS — N1831 Chronic kidney disease, stage 3a: Secondary | ICD-10-CM | POA: Diagnosis not present

## 2023-12-16 DIAGNOSIS — R7303 Prediabetes: Secondary | ICD-10-CM | POA: Diagnosis not present

## 2023-12-16 DIAGNOSIS — E890 Postprocedural hypothyroidism: Secondary | ICD-10-CM | POA: Diagnosis not present

## 2023-12-16 DIAGNOSIS — I129 Hypertensive chronic kidney disease with stage 1 through stage 4 chronic kidney disease, or unspecified chronic kidney disease: Secondary | ICD-10-CM | POA: Diagnosis not present

## 2023-12-23 DIAGNOSIS — M8580 Other specified disorders of bone density and structure, unspecified site: Secondary | ICD-10-CM | POA: Diagnosis not present

## 2023-12-23 DIAGNOSIS — Z950 Presence of cardiac pacemaker: Secondary | ICD-10-CM | POA: Diagnosis not present

## 2023-12-23 DIAGNOSIS — Z0000 Encounter for general adult medical examination without abnormal findings: Secondary | ICD-10-CM | POA: Diagnosis not present

## 2023-12-23 DIAGNOSIS — R7303 Prediabetes: Secondary | ICD-10-CM | POA: Diagnosis not present

## 2023-12-23 DIAGNOSIS — Z23 Encounter for immunization: Secondary | ICD-10-CM | POA: Diagnosis not present

## 2023-12-23 DIAGNOSIS — F339 Major depressive disorder, recurrent, unspecified: Secondary | ICD-10-CM | POA: Diagnosis not present

## 2023-12-23 DIAGNOSIS — E890 Postprocedural hypothyroidism: Secondary | ICD-10-CM | POA: Diagnosis not present

## 2023-12-23 DIAGNOSIS — E785 Hyperlipidemia, unspecified: Secondary | ICD-10-CM | POA: Diagnosis not present

## 2023-12-23 DIAGNOSIS — Z1331 Encounter for screening for depression: Secondary | ICD-10-CM | POA: Diagnosis not present

## 2023-12-23 DIAGNOSIS — N1831 Chronic kidney disease, stage 3a: Secondary | ICD-10-CM | POA: Diagnosis not present

## 2023-12-23 DIAGNOSIS — I471 Supraventricular tachycardia, unspecified: Secondary | ICD-10-CM | POA: Diagnosis not present

## 2023-12-23 DIAGNOSIS — I129 Hypertensive chronic kidney disease with stage 1 through stage 4 chronic kidney disease, or unspecified chronic kidney disease: Secondary | ICD-10-CM | POA: Diagnosis not present

## 2023-12-23 DIAGNOSIS — F419 Anxiety disorder, unspecified: Secondary | ICD-10-CM | POA: Diagnosis not present

## 2023-12-23 DIAGNOSIS — M797 Fibromyalgia: Secondary | ICD-10-CM | POA: Diagnosis not present

## 2023-12-23 DIAGNOSIS — Z1339 Encounter for screening examination for other mental health and behavioral disorders: Secondary | ICD-10-CM | POA: Diagnosis not present

## 2023-12-25 ENCOUNTER — Other Ambulatory Visit (HOSPITAL_COMMUNITY): Payer: Self-pay | Admitting: Internal Medicine

## 2023-12-25 ENCOUNTER — Telehealth (HOSPITAL_COMMUNITY): Payer: Self-pay | Admitting: Pharmacy Technician

## 2023-12-25 DIAGNOSIS — M810 Age-related osteoporosis without current pathological fracture: Secondary | ICD-10-CM | POA: Insufficient documentation

## 2023-12-25 NOTE — Telephone Encounter (Signed)
 Auth Submission: NO AUTH NEEDED Site of care: [facility] Payer: [employer]  Medication & CPT/J Code(s) submitted: Reclast  (Zolendronic acid) J3489 Diagnosis Code: M81.0 Route of submission (phone, fax, portal):  Phone # Fax # Auth type: Buy/Bill HB Units/visits requested: 5mg  x 1 dose, q 12 mths Reference number:  Approval from: 12/25/23 to 04/06/24    Dagoberto Armour, CPhT [facility] Phone: [phone] 12/25/2023

## 2023-12-31 ENCOUNTER — Ambulatory Visit (HOSPITAL_COMMUNITY)
Admission: RE | Admit: 2023-12-31 | Discharge: 2023-12-31 | Disposition: A | Discharge status: Home / Self Care | Source: Ambulatory Visit | Attending: Internal Medicine | Admitting: Internal Medicine

## 2023-12-31 VITALS — BP 129/100 | HR 49 | Temp 98.3°F | Resp 16

## 2023-12-31 DIAGNOSIS — M810 Age-related osteoporosis without current pathological fracture: Secondary | ICD-10-CM | POA: Insufficient documentation

## 2023-12-31 MED ORDER — ZOLEDRONIC ACID 5 MG/100ML IV SOLN
5.0000 mg | Freq: Once | INTRAVENOUS | Status: AC
  Administered 2023-12-31: 5 mg via INTRAVENOUS

## 2023-12-31 MED ORDER — SODIUM CHLORIDE 0.9 % IV SOLN: INTRAVENOUS | Status: DC

## 2023-12-31 MED ORDER — ZOLEDRONIC ACID 5 MG/100ML IV SOLN
INTRAVENOUS | Status: AC
  Filled 2023-12-31: qty 100

## 2024-01-28 DIAGNOSIS — M2012 Hallux valgus (acquired), left foot: Secondary | ICD-10-CM | POA: Diagnosis not present

## 2024-01-28 DIAGNOSIS — Z09 Encounter for follow-up examination after completed treatment for conditions other than malignant neoplasm: Secondary | ICD-10-CM | POA: Diagnosis not present

## 2024-01-28 DIAGNOSIS — L84 Corns and callosities: Secondary | ICD-10-CM | POA: Diagnosis not present

## 2024-01-28 DIAGNOSIS — R52 Pain, unspecified: Secondary | ICD-10-CM | POA: Diagnosis not present

## 2024-02-01 DIAGNOSIS — E890 Postprocedural hypothyroidism: Secondary | ICD-10-CM | POA: Diagnosis not present

## 2024-02-01 DIAGNOSIS — R7301 Impaired fasting glucose: Secondary | ICD-10-CM | POA: Diagnosis not present

## 2024-02-04 DIAGNOSIS — M2012 Hallux valgus (acquired), left foot: Secondary | ICD-10-CM | POA: Diagnosis not present

## 2024-02-08 DIAGNOSIS — R7301 Impaired fasting glucose: Secondary | ICD-10-CM | POA: Diagnosis not present

## 2024-02-08 DIAGNOSIS — E785 Hyperlipidemia, unspecified: Secondary | ICD-10-CM | POA: Diagnosis not present

## 2024-02-08 DIAGNOSIS — I10 Essential (primary) hypertension: Secondary | ICD-10-CM | POA: Diagnosis not present

## 2024-02-08 DIAGNOSIS — L659 Nonscarring hair loss, unspecified: Secondary | ICD-10-CM | POA: Diagnosis not present

## 2024-02-08 DIAGNOSIS — N189 Chronic kidney disease, unspecified: Secondary | ICD-10-CM | POA: Diagnosis not present

## 2024-02-08 DIAGNOSIS — E890 Postprocedural hypothyroidism: Secondary | ICD-10-CM | POA: Diagnosis not present

## 2024-02-08 DIAGNOSIS — M1990 Unspecified osteoarthritis, unspecified site: Secondary | ICD-10-CM | POA: Diagnosis not present

## 2024-02-08 DIAGNOSIS — G629 Polyneuropathy, unspecified: Secondary | ICD-10-CM | POA: Diagnosis not present

## 2024-02-08 DIAGNOSIS — R931 Abnormal findings on diagnostic imaging of heart and coronary circulation: Secondary | ICD-10-CM | POA: Diagnosis not present

## 2024-02-10 DIAGNOSIS — I10 Essential (primary) hypertension: Secondary | ICD-10-CM | POA: Diagnosis not present

## 2024-02-10 DIAGNOSIS — M19071 Primary osteoarthritis, right ankle and foot: Secondary | ICD-10-CM | POA: Diagnosis not present

## 2024-02-10 DIAGNOSIS — M2012 Hallux valgus (acquired), left foot: Secondary | ICD-10-CM | POA: Diagnosis not present

## 2024-02-10 DIAGNOSIS — E0590 Thyrotoxicosis, unspecified without thyrotoxic crisis or storm: Secondary | ICD-10-CM | POA: Diagnosis not present

## 2024-02-10 DIAGNOSIS — K219 Gastro-esophageal reflux disease without esophagitis: Secondary | ICD-10-CM | POA: Diagnosis not present

## 2024-02-10 DIAGNOSIS — L84 Corns and callosities: Secondary | ICD-10-CM | POA: Diagnosis not present

## 2024-02-10 DIAGNOSIS — Z79899 Other long term (current) drug therapy: Secondary | ICD-10-CM | POA: Diagnosis not present

## 2024-02-10 DIAGNOSIS — M79671 Pain in right foot: Secondary | ICD-10-CM | POA: Diagnosis not present

## 2024-02-10 DIAGNOSIS — Z950 Presence of cardiac pacemaker: Secondary | ICD-10-CM | POA: Diagnosis not present

## 2024-02-10 DIAGNOSIS — M2011 Hallux valgus (acquired), right foot: Secondary | ICD-10-CM | POA: Diagnosis not present

## 2024-02-10 DIAGNOSIS — Z87891 Personal history of nicotine dependence: Secondary | ICD-10-CM | POA: Diagnosis not present

## 2024-02-13 ENCOUNTER — Ambulatory Visit: Payer: Self-pay | Admitting: Cardiology

## 2024-03-01 DIAGNOSIS — Z961 Presence of intraocular lens: Secondary | ICD-10-CM | POA: Diagnosis not present

## 2024-03-01 DIAGNOSIS — H02403 Unspecified ptosis of bilateral eyelids: Secondary | ICD-10-CM | POA: Diagnosis not present

## 2024-03-01 DIAGNOSIS — H43393 Other vitreous opacities, bilateral: Secondary | ICD-10-CM | POA: Diagnosis not present

## 2024-03-01 DIAGNOSIS — H527 Unspecified disorder of refraction: Secondary | ICD-10-CM | POA: Diagnosis not present
